# Patient Record
Sex: Male | Born: 1935 | Race: White | Hispanic: No | Marital: Married | Smoking: Never smoker
Health system: Southern US, Community
[De-identification: ages and names within clinical notes are randomized; demographics above are authoritative.]

## PROBLEM LIST (undated history)

## (undated) DIAGNOSIS — L03211 Cellulitis of face: Secondary | ICD-10-CM

## (undated) DIAGNOSIS — I428 Other cardiomyopathies: Secondary | ICD-10-CM

## (undated) DIAGNOSIS — Z950 Presence of cardiac pacemaker: Secondary | ICD-10-CM

## (undated) DIAGNOSIS — R569 Unspecified convulsions: Secondary | ICD-10-CM

## (undated) DIAGNOSIS — I10 Essential (primary) hypertension: Secondary | ICD-10-CM

## (undated) DIAGNOSIS — Z8719 Personal history of other diseases of the digestive system: Secondary | ICD-10-CM

## (undated) DIAGNOSIS — F32A Depression, unspecified: Secondary | ICD-10-CM

## (undated) DIAGNOSIS — F0390 Unspecified dementia without behavioral disturbance: Secondary | ICD-10-CM

## (undated) DIAGNOSIS — I442 Atrioventricular block, complete: Secondary | ICD-10-CM

## (undated) DIAGNOSIS — I639 Cerebral infarction, unspecified: Secondary | ICD-10-CM

## (undated) DIAGNOSIS — I951 Orthostatic hypotension: Secondary | ICD-10-CM

## (undated) DIAGNOSIS — G459 Transient cerebral ischemic attack, unspecified: Secondary | ICD-10-CM

## (undated) DIAGNOSIS — G4730 Sleep apnea, unspecified: Secondary | ICD-10-CM

## (undated) DIAGNOSIS — G8929 Other chronic pain: Secondary | ICD-10-CM

## (undated) DIAGNOSIS — E785 Hyperlipidemia, unspecified: Secondary | ICD-10-CM

## (undated) DIAGNOSIS — M069 Rheumatoid arthritis, unspecified: Secondary | ICD-10-CM

## (undated) DIAGNOSIS — I509 Heart failure, unspecified: Secondary | ICD-10-CM

## (undated) DIAGNOSIS — I2510 Atherosclerotic heart disease of native coronary artery without angina pectoris: Secondary | ICD-10-CM

## (undated) DIAGNOSIS — F329 Major depressive disorder, single episode, unspecified: Secondary | ICD-10-CM

## (undated) HISTORY — PX: APPENDECTOMY: SHX54

## (undated) HISTORY — DX: Cerebral infarction, unspecified: I63.9

## (undated) HISTORY — PX: EXPLORATORY LAPAROTOMY: SUR591

## (undated) HISTORY — DX: Transient cerebral ischemic attack, unspecified: G45.9

## (undated) HISTORY — PX: TONSILLECTOMY: SUR1361

## (undated) HISTORY — PX: HERNIA REPAIR: SHX51

## (undated) HISTORY — DX: Depression, unspecified: F32.A

## (undated) HISTORY — DX: Essential (primary) hypertension: I10

## (undated) HISTORY — DX: Unspecified dementia, unspecified severity, without behavioral disturbance, psychotic disturbance, mood disturbance, and anxiety: F03.90

## (undated) HISTORY — DX: Other chronic pain: G89.29

## (undated) HISTORY — PX: CATARACT EXTRACTION W/ INTRAOCULAR LENS  IMPLANT, BILATERAL: SHX1307

## (undated) HISTORY — DX: Unspecified convulsions: R56.9

## (undated) HISTORY — DX: Major depressive disorder, single episode, unspecified: F32.9

## (undated) HISTORY — PX: CHOLECYSTECTOMY OPEN: SUR202

## (undated) HISTORY — PX: KNEE CARTILAGE SURGERY: SHX688

---

## 1997-10-29 ENCOUNTER — Ambulatory Visit (HOSPITAL_BASED_OUTPATIENT_CLINIC_OR_DEPARTMENT_OTHER): Admission: RE | Admit: 1997-10-29 | Discharge: 1997-10-29 | Payer: Self-pay | Admitting: Orthopaedic Surgery

## 2000-08-08 ENCOUNTER — Inpatient Hospital Stay (HOSPITAL_COMMUNITY): Admission: EM | Admit: 2000-08-08 | Discharge: 2000-08-09 | Payer: Self-pay

## 2000-08-09 ENCOUNTER — Encounter: Payer: Self-pay | Admitting: Cardiovascular Disease

## 2003-02-19 ENCOUNTER — Encounter: Admission: RE | Admit: 2003-02-19 | Discharge: 2003-02-19 | Payer: Self-pay | Admitting: Gastroenterology

## 2004-12-22 ENCOUNTER — Encounter: Admission: RE | Admit: 2004-12-22 | Discharge: 2004-12-22 | Payer: Self-pay | Admitting: Family Medicine

## 2005-07-28 ENCOUNTER — Encounter: Admission: RE | Admit: 2005-07-28 | Discharge: 2005-07-28 | Payer: Self-pay | Admitting: Gastroenterology

## 2006-02-08 DIAGNOSIS — R569 Unspecified convulsions: Secondary | ICD-10-CM

## 2006-02-08 DIAGNOSIS — G459 Transient cerebral ischemic attack, unspecified: Secondary | ICD-10-CM

## 2006-02-08 DIAGNOSIS — I639 Cerebral infarction, unspecified: Secondary | ICD-10-CM

## 2006-02-08 DIAGNOSIS — G4730 Sleep apnea, unspecified: Secondary | ICD-10-CM

## 2006-02-08 HISTORY — DX: Sleep apnea, unspecified: G47.30

## 2006-02-08 HISTORY — DX: Unspecified convulsions: R56.9

## 2006-02-08 HISTORY — DX: Cerebral infarction, unspecified: I63.9

## 2006-02-08 HISTORY — DX: Transient cerebral ischemic attack, unspecified: G45.9

## 2006-11-23 ENCOUNTER — Encounter: Admission: RE | Admit: 2006-11-23 | Discharge: 2006-11-23 | Payer: Self-pay | Admitting: Family Medicine

## 2006-12-01 ENCOUNTER — Encounter: Admission: RE | Admit: 2006-12-01 | Discharge: 2006-12-01 | Payer: Self-pay | Admitting: Neurology

## 2008-06-13 ENCOUNTER — Encounter: Admission: RE | Admit: 2008-06-13 | Discharge: 2008-06-13 | Payer: Self-pay | Admitting: Geriatric Medicine

## 2008-12-26 ENCOUNTER — Encounter: Admission: RE | Admit: 2008-12-26 | Discharge: 2008-12-26 | Payer: Self-pay | Admitting: Gastroenterology

## 2009-02-19 DIAGNOSIS — L03211 Cellulitis of face: Secondary | ICD-10-CM

## 2009-02-19 HISTORY — DX: Cellulitis of face: L03.211

## 2009-03-31 ENCOUNTER — Encounter: Admission: RE | Admit: 2009-03-31 | Discharge: 2009-03-31 | Payer: Self-pay | Admitting: Cardiovascular Disease

## 2009-04-01 ENCOUNTER — Ambulatory Visit (HOSPITAL_COMMUNITY)
Admission: RE | Admit: 2009-04-01 | Discharge: 2009-04-01 | Payer: Self-pay | Source: Home / Self Care | Admitting: Cardiovascular Disease

## 2009-04-01 HISTORY — PX: CARDIAC CATHETERIZATION: SHX172

## 2010-05-21 ENCOUNTER — Other Ambulatory Visit: Payer: Self-pay | Admitting: Cardiovascular Disease

## 2010-05-21 ENCOUNTER — Ambulatory Visit
Admission: RE | Admit: 2010-05-21 | Discharge: 2010-05-21 | Disposition: A | Discharge status: Home / Self Care | Payer: Medicare Other | Source: Ambulatory Visit | Attending: Cardiovascular Disease | Admitting: Cardiovascular Disease

## 2010-06-26 NOTE — Discharge Summary (Signed)
[place]  Patient:    Thomas Long, Thomas Long                        MRN: [id number] Adm. Date:  [id number] Disc. Date: [id number] Attending:  Colon Branch Dictator:   Tereso Newcomer, P.A.-C CC:         Vale Haven. Elson Clan.D.Noralyn Pick. Eden Emms, M.D. [facility], [facility]   Discharge Summary  DATE OF BIRTH:  Jun 20, 1949  DISCHARGE DIAGNOSES: 1. Chest pain, etiology unclear.  Myocardial infarction ruled out. 2. Positive family history for coronary artery disease.  PROCEDURES:  Adenosine Cardiolite on August 09, 2000, with sonographic images revealing EF 46%, no ischemia.  HOSPITAL COURSE:  This 77 year old male with history of normal cardiac catheterization in the early 1990s presented to the emergency room with complaints of chest pain.  His pain started at around 2:30 after lying down. He went to his primary physician who felt the patient had to be further evaluated in the emergency room.  The pain is described as constant with intermittent increases.  No aggravating alleviating factors.  His cardiac risk factors are negative for CAD, negative for hypertension, negative for diabetes mellitus, negative for tobacco.  His lipid status is unknown, and he does have a family history of CAD.  MEDICATIONS PRIOR TO ADMISSION:  None.  PHYSICAL EXAMINATION:  VITAL SIGNS:  Blood pressure 135/73, pulse 85, respirations 16.  NECK:  No JVD.  HEART:  Regular rate and rhythm.  Normal S1, S2.  CHEST:  Clear to auscultation bilaterally.  ABDOMEN:  Nontender.  EXTREMITIES:  Without clubbing, cyanosis, or edema.  LABORATORY DATA:  EKG: Sinus rhythm, rate 92.  No ischemic changes.  Chest x-ray: No acute disease.  HOSPITAL COURSE:  The patient was admitted for chest pain.  His cardiac were enzymes were negative x 2, and he ruled out for MI.  The patients pain was felt to be fairly atypical.  He was further evaluated with adenosine Cardiolite.  This was  performed on August 09, 2000, and the results are noted above.  The patient was still complaining of some pain on the morning of August 09, 2000.  He was started empirically on Protonix 40 mg a day.  Due to nonischemic Cardiolite, it was felt the patient was stable enough for discharge to home.  He would have further followup with his primary care physician.  LABORATORY DATA:  White blood cell count 6000, hemoglobin 12.5, hematocrit 36.4, platelet count 332,000.  INR 1.1.  Sodium 139, potassium 4, chloride 106, CO2 29, glucose 112, BUN 15, creatinine 0.7, total bilirubin 0.6, alkaline phosphatase 52, SGOT 23, SGPT 18, total protein 6.3, albumin 3.3, calcium 8.4.  CK #1, 125; #2, 95.  CK-MB #1, 5.8; #2, 4.1.  Troponin I #1 0.03; #2, 0.02.  Lipid profile: Total cholesterol 151, triglycerides 114, HDL 38, LDL 90.  DISCHARGE MEDICATIONS: 1. Coated aspirin 325 mg a day. 2. Protonix 40 mg a day.  ACTIVITY:  As tolerated.  DIET:  Low-fat, low-sodium diet.  FOLLOWUP:  The patient is to follow up with Dr. Andrey Campanile in one week, and he should be call for an appointment.  Decision on whether or not to proceed with Protonix therapy will be deferred to Dr. Andrey Campanile as well as any further workup. The patient should see Dr. Eden Emms in the future as needed. DD:  08/09/00 TD:  08/09/00 Job: [id number]

## 2011-02-16 DIAGNOSIS — L570 Actinic keratosis: Secondary | ICD-10-CM | POA: Diagnosis not present

## 2011-02-17 DIAGNOSIS — R195 Other fecal abnormalities: Secondary | ICD-10-CM | POA: Diagnosis not present

## 2011-02-25 DIAGNOSIS — R198 Other specified symptoms and signs involving the digestive system and abdomen: Secondary | ICD-10-CM | POA: Diagnosis not present

## 2011-02-25 DIAGNOSIS — R195 Other fecal abnormalities: Secondary | ICD-10-CM | POA: Diagnosis not present

## 2011-03-29 DIAGNOSIS — H4011X0 Primary open-angle glaucoma, stage unspecified: Secondary | ICD-10-CM | POA: Diagnosis not present

## 2011-03-29 DIAGNOSIS — H409 Unspecified glaucoma: Secondary | ICD-10-CM | POA: Diagnosis not present

## 2011-05-26 DIAGNOSIS — Z09 Encounter for follow-up examination after completed treatment for conditions other than malignant neoplasm: Secondary | ICD-10-CM | POA: Diagnosis not present

## 2011-05-26 DIAGNOSIS — K5730 Diverticulosis of large intestine without perforation or abscess without bleeding: Secondary | ICD-10-CM | POA: Diagnosis not present

## 2011-05-26 DIAGNOSIS — Z86010 Personal history of colonic polyps: Secondary | ICD-10-CM | POA: Diagnosis not present

## 2011-05-26 DIAGNOSIS — D126 Benign neoplasm of colon, unspecified: Secondary | ICD-10-CM | POA: Diagnosis not present

## 2011-06-28 DIAGNOSIS — H10509 Unspecified blepharoconjunctivitis, unspecified eye: Secondary | ICD-10-CM | POA: Diagnosis not present

## 2011-06-28 DIAGNOSIS — H409 Unspecified glaucoma: Secondary | ICD-10-CM | POA: Diagnosis not present

## 2011-06-28 DIAGNOSIS — H4011X0 Primary open-angle glaucoma, stage unspecified: Secondary | ICD-10-CM | POA: Diagnosis not present

## 2011-07-01 DIAGNOSIS — I119 Hypertensive heart disease without heart failure: Secondary | ICD-10-CM | POA: Diagnosis not present

## 2011-07-01 DIAGNOSIS — I2510 Atherosclerotic heart disease of native coronary artery without angina pectoris: Secondary | ICD-10-CM | POA: Diagnosis not present

## 2011-07-07 DIAGNOSIS — Z1331 Encounter for screening for depression: Secondary | ICD-10-CM | POA: Diagnosis not present

## 2011-07-07 DIAGNOSIS — E782 Mixed hyperlipidemia: Secondary | ICD-10-CM | POA: Diagnosis not present

## 2011-07-07 DIAGNOSIS — I10 Essential (primary) hypertension: Secondary | ICD-10-CM | POA: Diagnosis not present

## 2011-07-07 DIAGNOSIS — Z0000 Encounter for general adult medical examination without abnormal findings: Secondary | ICD-10-CM | POA: Diagnosis not present

## 2011-07-07 DIAGNOSIS — Z79899 Other long term (current) drug therapy: Secondary | ICD-10-CM | POA: Diagnosis not present

## 2011-07-21 DIAGNOSIS — I63239 Cerebral infarction due to unspecified occlusion or stenosis of unspecified carotid arteries: Secondary | ICD-10-CM | POA: Diagnosis not present

## 2011-07-21 DIAGNOSIS — R569 Unspecified convulsions: Secondary | ICD-10-CM | POA: Diagnosis not present

## 2011-08-02 DIAGNOSIS — I10 Essential (primary) hypertension: Secondary | ICD-10-CM | POA: Diagnosis not present

## 2011-08-02 DIAGNOSIS — E782 Mixed hyperlipidemia: Secondary | ICD-10-CM | POA: Diagnosis not present

## 2011-08-02 DIAGNOSIS — Z1331 Encounter for screening for depression: Secondary | ICD-10-CM | POA: Diagnosis not present

## 2011-08-02 DIAGNOSIS — Z79899 Other long term (current) drug therapy: Secondary | ICD-10-CM | POA: Diagnosis not present

## 2011-08-02 DIAGNOSIS — Z0000 Encounter for general adult medical examination without abnormal findings: Secondary | ICD-10-CM | POA: Diagnosis not present

## 2011-08-17 DIAGNOSIS — L219 Seborrheic dermatitis, unspecified: Secondary | ICD-10-CM | POA: Diagnosis not present

## 2011-08-17 DIAGNOSIS — L570 Actinic keratosis: Secondary | ICD-10-CM | POA: Diagnosis not present

## 2011-10-04 DIAGNOSIS — H409 Unspecified glaucoma: Secondary | ICD-10-CM | POA: Diagnosis not present

## 2011-10-04 DIAGNOSIS — H4011X0 Primary open-angle glaucoma, stage unspecified: Secondary | ICD-10-CM | POA: Diagnosis not present

## 2011-10-08 DIAGNOSIS — J309 Allergic rhinitis, unspecified: Secondary | ICD-10-CM | POA: Diagnosis not present

## 2011-11-04 DIAGNOSIS — Z23 Encounter for immunization: Secondary | ICD-10-CM | POA: Diagnosis not present

## 2011-12-22 DIAGNOSIS — I10 Essential (primary) hypertension: Secondary | ICD-10-CM | POA: Diagnosis not present

## 2011-12-22 DIAGNOSIS — M6530 Trigger finger, unspecified finger: Secondary | ICD-10-CM | POA: Diagnosis not present

## 2011-12-30 DIAGNOSIS — H4011X0 Primary open-angle glaucoma, stage unspecified: Secondary | ICD-10-CM | POA: Diagnosis not present

## 2011-12-30 DIAGNOSIS — Z961 Presence of intraocular lens: Secondary | ICD-10-CM | POA: Diagnosis not present

## 2011-12-30 DIAGNOSIS — H409 Unspecified glaucoma: Secondary | ICD-10-CM | POA: Diagnosis not present

## 2012-03-13 DIAGNOSIS — H409 Unspecified glaucoma: Secondary | ICD-10-CM | POA: Diagnosis not present

## 2012-03-13 DIAGNOSIS — H4011X0 Primary open-angle glaucoma, stage unspecified: Secondary | ICD-10-CM | POA: Diagnosis not present

## 2012-05-30 ENCOUNTER — Other Ambulatory Visit (HOSPITAL_COMMUNITY): Payer: Self-pay | Admitting: Cardiovascular Disease

## 2012-05-30 DIAGNOSIS — R5381 Other malaise: Secondary | ICD-10-CM | POA: Diagnosis not present

## 2012-05-30 DIAGNOSIS — R0989 Other specified symptoms and signs involving the circulatory and respiratory systems: Secondary | ICD-10-CM | POA: Diagnosis not present

## 2012-05-30 DIAGNOSIS — R5383 Other fatigue: Secondary | ICD-10-CM | POA: Diagnosis not present

## 2012-05-30 DIAGNOSIS — E782 Mixed hyperlipidemia: Secondary | ICD-10-CM | POA: Diagnosis not present

## 2012-05-30 DIAGNOSIS — R0609 Other forms of dyspnea: Secondary | ICD-10-CM | POA: Diagnosis not present

## 2012-05-30 DIAGNOSIS — R0600 Dyspnea, unspecified: Secondary | ICD-10-CM

## 2012-05-31 DIAGNOSIS — I119 Hypertensive heart disease without heart failure: Secondary | ICD-10-CM | POA: Diagnosis not present

## 2012-05-31 DIAGNOSIS — E782 Mixed hyperlipidemia: Secondary | ICD-10-CM | POA: Diagnosis not present

## 2012-05-31 DIAGNOSIS — Z79899 Other long term (current) drug therapy: Secondary | ICD-10-CM | POA: Diagnosis not present

## 2012-05-31 DIAGNOSIS — R5381 Other malaise: Secondary | ICD-10-CM | POA: Diagnosis not present

## 2012-06-12 DIAGNOSIS — H409 Unspecified glaucoma: Secondary | ICD-10-CM | POA: Diagnosis not present

## 2012-06-12 DIAGNOSIS — H4011X0 Primary open-angle glaucoma, stage unspecified: Secondary | ICD-10-CM | POA: Diagnosis not present

## 2012-06-21 ENCOUNTER — Ambulatory Visit (HOSPITAL_COMMUNITY)
Admission: RE | Admit: 2012-06-21 | Discharge: 2012-06-21 | Disposition: A | Discharge status: Home / Self Care | Payer: Medicare Other | Source: Ambulatory Visit | Attending: Cardiovascular Disease | Admitting: Cardiovascular Disease

## 2012-06-21 DIAGNOSIS — R0609 Other forms of dyspnea: Secondary | ICD-10-CM | POA: Diagnosis not present

## 2012-06-21 DIAGNOSIS — I079 Rheumatic tricuspid valve disease, unspecified: Secondary | ICD-10-CM | POA: Insufficient documentation

## 2012-06-21 DIAGNOSIS — I059 Rheumatic mitral valve disease, unspecified: Secondary | ICD-10-CM | POA: Diagnosis not present

## 2012-06-21 DIAGNOSIS — R0989 Other specified symptoms and signs involving the circulatory and respiratory systems: Secondary | ICD-10-CM | POA: Insufficient documentation

## 2012-06-21 DIAGNOSIS — R0600 Dyspnea, unspecified: Secondary | ICD-10-CM

## 2012-06-21 NOTE — Progress Notes (Signed)
Thomas Long [facility]   2D echo completed 06/21/2012.   Cindy Adan Beal, RDCS  

## 2012-07-07 NOTE — Progress Notes (Signed)
Chart pulled and taken to Dr. Tresa Endo for review per his request.

## 2012-07-13 DIAGNOSIS — R350 Frequency of micturition: Secondary | ICD-10-CM | POA: Diagnosis not present

## 2012-07-13 DIAGNOSIS — E782 Mixed hyperlipidemia: Secondary | ICD-10-CM | POA: Diagnosis not present

## 2012-07-13 DIAGNOSIS — Z1331 Encounter for screening for depression: Secondary | ICD-10-CM | POA: Diagnosis not present

## 2012-07-13 DIAGNOSIS — Z79899 Other long term (current) drug therapy: Secondary | ICD-10-CM | POA: Diagnosis not present

## 2012-07-13 DIAGNOSIS — Z0000 Encounter for general adult medical examination without abnormal findings: Secondary | ICD-10-CM | POA: Diagnosis not present

## 2012-07-13 DIAGNOSIS — I10 Essential (primary) hypertension: Secondary | ICD-10-CM | POA: Diagnosis not present

## 2012-07-26 ENCOUNTER — Ambulatory Visit (INDEPENDENT_AMBULATORY_CARE_PROVIDER_SITE_OTHER): Payer: Medicare Other | Admitting: Neurology

## 2012-07-26 ENCOUNTER — Encounter: Payer: Self-pay | Admitting: Neurology

## 2012-07-26 VITALS — BP 107/63 | HR 92 | Resp 16 | Wt 200.0 lb

## 2012-07-26 DIAGNOSIS — R569 Unspecified convulsions: Secondary | ICD-10-CM | POA: Diagnosis not present

## 2012-07-26 DIAGNOSIS — I6350 Cerebral infarction due to unspecified occlusion or stenosis of unspecified cerebral artery: Secondary | ICD-10-CM | POA: Diagnosis not present

## 2012-07-26 DIAGNOSIS — I639 Cerebral infarction, unspecified: Secondary | ICD-10-CM

## 2012-07-26 DIAGNOSIS — G459 Transient cerebral ischemic attack, unspecified: Secondary | ICD-10-CM | POA: Insufficient documentation

## 2012-07-26 MED ORDER — LEVETIRACETAM ER 500 MG PO TB24: 500.0000 mg | ORAL_TABLET | Freq: Every day | ORAL | Status: DC

## 2012-07-26 MED ORDER — ASPIRIN-DIPYRIDAMOLE ER 25-200 MG PO CP12: 1.0000 | ORAL_CAPSULE | Freq: Two times a day (BID) | ORAL | Status: DC

## 2012-07-26 NOTE — Progress Notes (Signed)
[facility]  Provider: CM/  Dr. Pearlean Brownie- seen today by Dr .Vickey Huger  Referring Provider:  Pete Glatter MD  Primary Care Physician:  Delia Heady MD, Darrol Angel .    Chief Complaint : abnormal carotid doppler scan.   HPI:  Thomas Long is a 79 y.o. 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 Seen  here as a referral / follow up. Last seen for Dr. Pearlean Brownie by Darrol Angel in June 2013 . The  patient had a small stroke  In 2008 and than a new onset  frontal lobe seizure- so far a single event..  This patient has a history of psoriasis with severe arthritis and was treated with Enbrel until 3 years ago when he developed a facial cellulitis, he has remained untreated for the psoriatic condition since that time. He has a history of nocturnal seizure attributed to to a small frontal lobe infarct on the left side, this was discovered in 2008.  EEG at that time interpreted by Dr. Kelli Hope and  demonstrated a focal sharp activity in the left temple area. He started now on Extended release as the regular form of Keppra caused dizziness and drowsiness. The dorsal is also decreased in comparison to the initial goals. The patient had no recent falls, no weakness or clumsiness, the patient is driving without difficulty and has not gotten lost. He sometimes has delay it worked finding or naming. His wife added that he may have some amnestic spells but there is no short term or recent amnesia. Dr. Nicholaus Bloom is his cardiologist, Dr. Pete Glatter is his primary doctor. He was assigned by Sherril Croon 2012 and 2013 visit to Dr. Pearlean Brownie. He he underwent a stroke screening and heart disease screening through his church, 3 weeks ago with with a result of moderate carotid artery stenosis bilaterally. No evidence of PVD and" normal heart function". The report is not available here, as the patient  did not bring it.  Dr Pearlean Brownie is not in the  office today and the patient was scheduled with me.                    Review of Systems: Out of a complete 14 system review, the patient complains of only the following symptoms, and all other reviewed systems are negative.  painfull arthritis, psoriasis, single seizure.glaucoma.  "Carotid stenosis" , trouble to swallow solid  Food.  The patient had a tonsillectomy in adulthood.   History   Social History  . Marital Status: Married    Spouse Name: N/A    Number of Children: 5  . Years of Education: N/A   Occupational History  . Not on file.   Social History Main Topics  . Smoking status: Never Smoker   . Smokeless tobacco: Not on file  . Alcohol Use: No  . Drug Use: No  . Sexually Active: Not on file   Other Topics Concern  . Not on file   Social History Narrative  . No narrative on file    No family history on file.  Past Medical History  Diagnosis Date  . Thyroid disorder   . Depression   . Anxiety   . Diabetes   . High cholesterol   . Dementia   . Hypertension   . Chronic pain   . Heart disease   . Headache(784.0)     migraines  . Seizures   . Stroke   .  TIA (transient ischemic attack)     Past Surgical History  Procedure Laterality Date  . Facial cellulitis  02/19/09  . Cardiac catheterization  04/01/09    Current Outpatient Prescriptions  Medication Sig Dispense Refill  . loratadine (CLARITIN) 10 MG tablet Take 10 mg by mouth daily.      . Multiple Vitamins-Minerals (CENTRUM SILVER ADULT 50+ PO) Take 1 capsule by mouth daily.      Marland Kitchen atorvastatin (LIPITOR) 10 MG tablet Take 10 mg by mouth daily.      Marland Kitchen dipyridamole-aspirin (AGGRENOX) 200-25 MG per 12 hr capsule Take 1 capsule by mouth 2 (two) times daily.      Marland Kitchen levETIRAcetam (KEPPRA) 500 MG tablet Take 500 mg by mouth daily. [id number]      . nebivolol (BYSTOLIC) 5 MG tablet Take 2.5 mg by mouth 2 (two) times daily.        No current facility-administered medications for this visit.     Allergies as of 07/26/2012  . (Not on File)    Vitals: BP 107/63  Pulse 92  Resp 16  Wt 200 lb (90.719 kg)  BMI 31.32 kg/m2 Last Weight:  Wt Readings from Last 1 Encounters:  07/26/12 200 lb (90.719 kg)   Last Height:   Ht Readings from Last 1 Encounters:  07/26/12 5\' 7"  (1.702 m)     Physical exam:  General: The patient is awake, alert and appears not in acute distress. The patient is well groomed. Head: Normocephalic, atraumatic. Neck is supple. Mallampati 1  with asymmetry of the uvula , lower on the left.  neck circumference: 16.5 inches , no retrognathia.  Cardiovascular:  Regular rate and rhythm, without  murmurs or carotid bruit, and without distended neck veins. Respiratory: Lungs are clear to auscultation. Skin:  Without evidence of edema, or rash Trunk: BMI is elevated and patient  has normal posture.  Neurologic exam : The patient is awake and alert, oriented to place and time.  Memory subjective  described as mildly impaired for names and otherwise  intact. There is a normal attention span & concentration ability.  Speech is fluent with mild  Dysphonia, not aphasia. Mood and affect are appropriate.  Cranial nerves: Pupils are equal and briskly reactive to light.  He was unable to smoothly persue a moving object , appears to have central scotoma.   Extraocular movements  in vertical and horizontal planes without nystagmus. Hearing to finger rub intact.   Facial sensation intact to fine touch. Facial motor strength is symmetric and tongue  Midline, but uvula is lower on the left .  Motor exam:  Normal tone and normal muscle bulk and symmetric normal strength in all extremities. He has pain and related provision of lesser grip strength.   Sensory:  Fine touch, pinprick and vibration were tested in all extremities. Proprioception is tested ,was  normal.  Coordination: Rapid alternating movements in the fingers/hands is tested and normal. Finger-to-nose maneuver  tested and normal without evidence of ataxia, dysmetria or tremor.  Gait and station: Patient walks without assistive device, able to climb up to the exam table.Stance is stable and normal. Steps are un-fragmented, but wide based . He needed no assistence.  Romberg testing is positvel.  Deep tendon reflexes: in the  upper and lower extremities are symmetric and intact. Babinski maneuver response is downgoing   downgoing.   Assessment:  After physical and neurologic examination, review of laboratory studies, imaging, neurophysiology testing and pre-existing records,  I will ask  his primary neurologist , Dr Pearlean Brownie to  evaluate the outside carotid doppler reports. He has no known recent stroke, and needs at this time an MRI brain only for the memory loss evaluation.  MMSE was 29-30 , and is not indicative of dementia.  His uvula was likely damaged in the tonsillectomy.   Plan:  Treatment plan and additional workup will be reviewed under Problem List.   I refilled his Keppra and not place him on aricept , as not to lower his seizure threshold. Dr Tresa Endo will see him next week for BP control and keeps him on Aggrenox.

## 2012-07-26 NOTE — Patient Instructions (Signed)
  Dear Mr. Thomas Long, please provide a copy of your recent testing including the carotid artery report EKG and peripheral vascular checkup to our office that we are able to see if there is a concern of a higher stroke risk  To you.   I will ask Dr. Pearlean Brownie to follow up again, if any abnormalities a warrant any intervention.  His reminds her ophthalmologist to obtain a visual field test.

## 2012-08-01 ENCOUNTER — Ambulatory Visit: Payer: Medicare Other | Admitting: Cardiovascular Disease

## 2012-08-01 ENCOUNTER — Encounter: Payer: Self-pay | Admitting: Cardiovascular Disease

## 2012-08-01 ENCOUNTER — Ambulatory Visit (INDEPENDENT_AMBULATORY_CARE_PROVIDER_SITE_OTHER): Payer: Medicare Other | Admitting: Cardiovascular Disease

## 2012-08-01 VITALS — BP 110/60 | HR 76 | Ht 68.0 in | Wt 202.8 lb

## 2012-08-01 DIAGNOSIS — R42 Dizziness and giddiness: Secondary | ICD-10-CM

## 2012-08-01 DIAGNOSIS — I2510 Atherosclerotic heart disease of native coronary artery without angina pectoris: Secondary | ICD-10-CM | POA: Diagnosis not present

## 2012-08-01 NOTE — Patient Instructions (Addendum)
Your physician recommends that you schedule a follow-up appointment in: 3 months.  Your physician has recommended you make the following change in your medication: bystolic change to 1/2 of the 2.5 mg tablet for 4 days then take 1/2 tablet  Every other day for 4  days then stop completely.  Call back if compression hose needed.

## 2012-08-08 DIAGNOSIS — R3129 Other microscopic hematuria: Secondary | ICD-10-CM | POA: Diagnosis not present

## 2012-08-10 ENCOUNTER — Encounter: Payer: Self-pay | Admitting: Cardiovascular Disease

## 2012-08-10 DIAGNOSIS — I2510 Atherosclerotic heart disease of native coronary artery without angina pectoris: Secondary | ICD-10-CM | POA: Insufficient documentation

## 2012-08-10 DIAGNOSIS — R42 Dizziness and giddiness: Secondary | ICD-10-CM | POA: Insufficient documentation

## 2012-08-10 NOTE — Progress Notes (Signed)
Patient ID: Thomas Long, male   DOB: Sep 15, 1931, 79 y.o.   MRN: [id number]     HPI: Thomas Long, is a 79 y.o. male who presents to the office today for followup cardiology evaluation.    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  In 2011 cardiac catheterization revealed low normal ejection fraction at 50% with mild coronary artery disease coronary calcification with segmental 20% narrowing in the proximal LAD and mid LAD with mild muscle bridging, calcification at the ostium of the right coronary artery with 20-30% narrowing in the proximal to mid RCA and 30% distal narrowing. He has a remote history of a TIA with possible seizure for which in the past he's been on Aggrenox and Keppra and is currently followed by neurology. An echo Doppler study done in April 2012 showed an ejection fraction of 50% with inferior hypokinesis, mild mitral annular calcification and mild TR, mild aortic sclerosis without stenosis. I had seen him on 05/30/2012 with complaints of increased fatigability as well as shortness of breath with less activity. Underwent a two-year followup echo Doppler study on 07/06/2012 which showed mild LVH. Ejection fraction remains 50-55% and again there was mild hypokinesis of the basal mid inferior myocardium. Gated grade 1 diastolic dysfunction. He had minimal pulmonary hypertension with PA pressure 32 mm. He didn't mitral annular calcification with trivial Thomas. His aortic valve was mildly sclerotic without stenosis.  Thomas Long does admit to episodes of mild dizziness if he stands up abruptly he continues to show fatigue. He denies chest pressure. He presents for followup evaluation.  Past Medical History  Diagnosis Date  . Thyroid disorder   . Depression   . Anxiety   . Diabetes   . High cholesterol   . Dementia   . Hypertension   . Chronic pain   . Heart disease   . Headache(784.0)     migraines  . Seizures   . Stroke   . TIA (transient ischemic  attack)     Past Surgical History  Procedure Laterality Date  . Facial cellulitis  02/19/09  . Cardiac catheterization  04/01/09    Not on File  Current Outpatient Prescriptions  Medication Sig Dispense Refill  . atorvastatin (LIPITOR) 10 MG tablet Take 10 mg by mouth daily.      Marland Kitchen dipyridamole-aspirin (AGGRENOX) 200-25 MG per 12 hr capsule Take 1 capsule by mouth 2 (two) times daily.  180 capsule  3  . levETIRAcetam (KEPPRA XR) 500 MG 24 hr tablet Take 1 tablet (500 mg total) by mouth daily.  90 tablet  3  . loratadine (CLARITIN) 10 MG tablet Take 10 mg by mouth daily.      . Multiple Vitamins-Minerals (CENTRUM SILVER ADULT 50+ PO) Take 1 capsule by mouth daily.      . nebivolol (BYSTOLIC) 2.5 MG tablet Take 2.5 mg by mouth daily.       No current facility-administered medications for this visit.    Socially he is married has 5 children 6 grandchildren. He typically goes to bed approximately 8:30 at night and wakes up at approximately 4 AM in the morning. In the early morning he spends time in prayer and then typically attends 7 AM Mass.  ROS is negative for fevers, chills or night sweats.  He denies chest pain. He denies any recurrent seizure activity. Denies paresthesias. He denies any passes. He denies significant edema. He recently was evaluated by his neurologist. apparently he is also scheduled to see  a urologist in the coming month. Other system review is negative.  PE BP 110/60  Pulse 76  Ht 5\' 8"  (1.727 m)  Wt 202 lb 12.8 oz (91.989 kg)  BMI 30.84 kg/m2  Repeat blood pressure by me was 98/64 supine and this changed to 90/60 standing. General: Alert, oriented, no distress.  Skin: normal turgor, no rashes HEENT: Normocephalic, atraumatic. Pupils round and reactive; sclera anicteric;no lid lag.  Nose without nasal septal hypertrophy Mouth/Parynx benign; Mallinpatti scale 2 Neck: No JVD, no carotid briuts Lungs: clear to ausculatation and percussion; no wheezing or  rales Heart: RRR, s1 s2 normal  1/6 systolic murmur, unchanged. Abdomen: soft, nontender; no hepatosplenomehaly, BS+; abdominal aorta nontender and not dilated by palpation. mild central adiposity.  Pulses 2+ Extremities: no clubbing cyanosis or edema, Homan's sign negative  Neurologic: grossly nonfocal  ECG: Normal sinus rhythm at 76 beats per minute. Borderline first-degree block with a period of 0206 ms. Nonspecific intraventricular conduction delay.   LABS:  BMET No results found for this basename: na, k, cl, co2, glucose, bun, creatinine, calcium, gfrnonaa, gfraa     Hepatic Function Panel  No results found for this basename: prot, albumin, ast, alt, alkphos, bilitot, bilidir, ibili     CBC No results found for this basename: wbc, rbc, hgb, hct, plt, mcv, mch, mchc, rdw, neutrabs, lymphsabs, monoabs, eosabs, basosabs     BNP No results found for this basename: probnp    Lipid Panel  No results found for this basename: chol, trig, hdl, cholhdl, vldl, ldlcalc     RADIOLOGY: No results found.    ASSESSMENT AND PLAN: Thomas  Long continues no fatigability as well remaining somewhat hypotensive. Recently, his hydrochlorothiazide dose had been discontinued by Dr. Pete Glatter. His echo Doppler study continues to show an ejection fraction in the 50-55% range. He has been taking Bystolic at just 2.5 mg daily. He will then reduce this to a half a pill for 4 days then half a pill every other day for 4 days and then he will discontinue the Bystolic altogether. Hopefully this will improve his symptoms of fatigue as well as his mild orthostasis changes with reference to his blood pressure. I will see him in several months for followup evaluation      Lennette Bihari, MD, [facility]  08/10/2012 12:49 PM

## 2012-08-29 DIAGNOSIS — K5730 Diverticulosis of large intestine without perforation or abscess without bleeding: Secondary | ICD-10-CM | POA: Diagnosis not present

## 2012-08-29 DIAGNOSIS — R3129 Other microscopic hematuria: Secondary | ICD-10-CM | POA: Diagnosis not present

## 2012-08-29 DIAGNOSIS — N400 Enlarged prostate without lower urinary tract symptoms: Secondary | ICD-10-CM | POA: Diagnosis not present

## 2012-08-30 DIAGNOSIS — L570 Actinic keratosis: Secondary | ICD-10-CM | POA: Diagnosis not present

## 2012-08-30 DIAGNOSIS — L219 Seborrheic dermatitis, unspecified: Secondary | ICD-10-CM | POA: Diagnosis not present

## 2012-08-30 DIAGNOSIS — L259 Unspecified contact dermatitis, unspecified cause: Secondary | ICD-10-CM | POA: Diagnosis not present

## 2012-09-01 DIAGNOSIS — R3129 Other microscopic hematuria: Secondary | ICD-10-CM | POA: Diagnosis not present

## 2012-09-11 ENCOUNTER — Telehealth: Payer: Self-pay | Admitting: Cardiovascular Disease

## 2012-09-11 DIAGNOSIS — H00029 Hordeolum internum unspecified eye, unspecified eyelid: Secondary | ICD-10-CM | POA: Diagnosis not present

## 2012-09-11 MED ORDER — ATORVASTATIN CALCIUM 10 MG PO TABS: 10.0000 mg | ORAL_TABLET | Freq: Every day | ORAL | Status: DC

## 2012-09-11 NOTE — Telephone Encounter (Signed)
Returned call.  Pt informed Refill(s) sent to pharmacy and of refill process.  Pt verbalized understanding.

## 2012-09-11 NOTE — Telephone Encounter (Signed)
Pt needs refill for atorvastatin 10 mg.  He gets it through [facility] and states they will not take refill request from him/patients.  Needs 90 day supply.[name] phone # is [phone].

## 2012-09-18 DIAGNOSIS — H409 Unspecified glaucoma: Secondary | ICD-10-CM | POA: Diagnosis not present

## 2012-09-18 DIAGNOSIS — H00029 Hordeolum internum unspecified eye, unspecified eyelid: Secondary | ICD-10-CM | POA: Diagnosis not present

## 2012-09-18 DIAGNOSIS — H4011X0 Primary open-angle glaucoma, stage unspecified: Secondary | ICD-10-CM | POA: Diagnosis not present

## 2012-10-02 DIAGNOSIS — H00019 Hordeolum externum unspecified eye, unspecified eyelid: Secondary | ICD-10-CM | POA: Diagnosis not present

## 2012-10-02 DIAGNOSIS — H0019 Chalazion unspecified eye, unspecified eyelid: Secondary | ICD-10-CM | POA: Diagnosis not present

## 2012-10-11 DIAGNOSIS — H0019 Chalazion unspecified eye, unspecified eyelid: Secondary | ICD-10-CM | POA: Diagnosis not present

## 2012-10-23 DIAGNOSIS — H409 Unspecified glaucoma: Secondary | ICD-10-CM | POA: Diagnosis not present

## 2012-10-23 DIAGNOSIS — H4011X0 Primary open-angle glaucoma, stage unspecified: Secondary | ICD-10-CM | POA: Diagnosis not present

## 2012-10-29 ENCOUNTER — Encounter: Payer: Self-pay | Admitting: *Deleted

## 2012-11-02 ENCOUNTER — Encounter: Payer: Self-pay | Admitting: Cardiovascular Disease

## 2012-11-02 ENCOUNTER — Telehealth: Payer: Self-pay | Admitting: Cardiovascular Disease

## 2012-11-02 ENCOUNTER — Ambulatory Visit (INDEPENDENT_AMBULATORY_CARE_PROVIDER_SITE_OTHER): Payer: Medicare Other | Admitting: Cardiovascular Disease

## 2012-11-02 VITALS — BP 112/70 | HR 94 | Ht 68.0 in | Wt 200.0 lb

## 2012-11-02 DIAGNOSIS — I2510 Atherosclerotic heart disease of native coronary artery without angina pectoris: Secondary | ICD-10-CM | POA: Diagnosis not present

## 2012-11-02 DIAGNOSIS — R42 Dizziness and giddiness: Secondary | ICD-10-CM | POA: Diagnosis not present

## 2012-11-02 DIAGNOSIS — R569 Unspecified convulsions: Secondary | ICD-10-CM | POA: Diagnosis not present

## 2012-11-02 DIAGNOSIS — G459 Transient cerebral ischemic attack, unspecified: Secondary | ICD-10-CM | POA: Diagnosis not present

## 2012-11-02 NOTE — Patient Instructions (Addendum)
Your physician recommends that you schedule a follow-up appointment in: 6 MONTHS. No changes were made today. 

## 2012-11-02 NOTE — Telephone Encounter (Signed)
Dr. Tresa Endo is in clinic today and message has been sent to him to review.  Caller will have to wait for response from Dr. Pierre Bali, CMA.  This note on cart.

## 2012-11-02 NOTE — Telephone Encounter (Signed)
Message forwarded to Dr. Kelly/Wanda, CMA.  

## 2012-11-02 NOTE — Telephone Encounter (Signed)
Need in writing that ir is all right for him to stop his Aggrenox.Please fax to [phone]  Att::Erica.If possible please send today.

## 2012-11-02 NOTE — Telephone Encounter (Signed)
Just checking on the fax sent over to clear him for surgery. Need tjhis  asap.It was faxed on 10-26-12.

## 2012-11-03 ENCOUNTER — Encounter: Payer: Self-pay | Admitting: *Deleted

## 2012-11-03 NOTE — Telephone Encounter (Signed)
Per kristen VO. From Dr. Tresa Endo okay to D/C aggrenox for 5 days. Note faxed to Dr. Retta Mac @[phone].

## 2012-11-04 ENCOUNTER — Encounter: Payer: Self-pay | Admitting: Cardiovascular Disease

## 2012-11-04 NOTE — Progress Notes (Signed)
Patient ID: Thomas Long, male   DOB: 1931-10-26, 79 y.o.   MRN: [id number]     HPI: Thomas Long, is a 79 y.o. male who presents to the office today for followup cardiology evaluation.    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  In 2011 cardiac catheterization revealed low normal ejection fraction at 50% with mild coronary artery disease coronary calcification with segmental 20% narrowing in the proximal LAD and mid LAD with mild muscle bridging, calcification at the ostium of the right coronary artery with 20-30% narrowing in the proximal to mid RCA and 30% distal narrowing. He has a remote history of a TIA with possible seizure for which in the past he's been on Aggrenox and Keppra and is currently followed by neurology. An echo Doppler study done in April 2012 showed an ejection fraction of 50% with inferior hypokinesis, mild mitral annular calcification and mild TR, mild aortic sclerosis without stenosis. I had seen him on 05/30/2012 with complaints of increased fatigability as well as shortness of breath with less activity.  A two-year followup echo Doppler study on 07/06/2012 which showed mild LVH. Ejection fraction was 50-55% and again there was mild hypokinesis of the basal mid inferior myocardium and  grade 1 diastolic dysfunction. He had minimal pulmonary hypertension with PA pressure 32 mm. He didn't mitral annular calcification with trivial MR. His aortic valve was mildly sclerotic without stenosis.  When I last saw on 08/01/2012 he did have orthostatic symptoms and was hypotensive. At that time, I recommended that he weaned slowly and ultimately discontinue his Bystolic. He has felt improved off this therapy. He denies any awareness of tachycardia palpitations or pulse irregularity. Presents for evaluation  Past Medical History  Diagnosis Date  . Thyroid disorder   . Depression   . Anxiety   . Diabetes   . High cholesterol   . Dementia   .  Hypertension   . Chronic pain   . Heart disease   . Headache(784.0)     migraines  . Seizures   . Stroke   . TIA (transient ischemic attack)   . Hx of echocardiogram 05/2010    showed EF 50% with inferior hypokinesis. He did have mild mitral annular calcification, mild TR, mild aortic sclerosis without stenosis.  Marland Kitchen History of stress test 05/2010    A moderate perfusion defect is seen in the apical septal, Basal inferoseptal and mid inferoseptal region. post stress EF 53%. Wall motion abnormalities. Abnormal myocardial perfusion study.    Past Surgical History  Procedure Laterality Date  . Facial cellulitis  02/19/09  . Cardiac catheterization  04/01/09    which showed low normal EF at 50% with question of underlying borderline area of minimal inferoapical hypercontractility. He had mild coronary obstructive disease with coronary calcification and segmental 20% narrowing in the LAD proximally, 20% in the mid LAD with muscle bridging. There was calcification at the ostium of the RCA, and 20 to 30%narrowing of the proximal to mid RCA with 30% distal n  . Cholecystectomy      No Known Allergies  Current Outpatient Prescriptions  Medication Sig Dispense Refill  . atorvastatin (LIPITOR) 10 MG tablet Take 1 tablet (10 mg total) by mouth daily.  90 tablet  2  . dipyridamole-aspirin (AGGRENOX) 200-25 MG per 12 hr capsule Take 1 capsule by mouth 2 (two) times daily.  180 capsule  3  . levETIRAcetam (KEPPRA XR) 500 MG 24 hr tablet Take 1 tablet (  500 mg total) by mouth daily.  90 tablet  3  . loratadine (CLARITIN) 10 MG tablet Take 10 mg by mouth daily.      . Multiple Vitamins-Minerals (CENTRUM SILVER ADULT 50+ PO) Take 1 capsule by mouth daily.       No current facility-administered medications for this visit.    Socially he is married has 5 children 6 grandchildren. He typically goes to bed approximately 8:30 at night and wakes up at approximately 4 AM in the morning. In the early morning he  spends time in prayer and then typically attends 7 AM Mass.  ROS is negative for fevers, chills or night sweats. His fatigue is somewhat improved. He denies any dizziness. He denies chest pain. He denies any recurrent seizure activity. Denies paresthesias.  He denies significant edema. He tells me he need to have her teeth extracted. He denies any recurrent seizure activity. He now sees Dr. Norlene Duel for his neurology care. Other system review is negative.  PE BP 112/70  Pulse 94  Ht 5\' 8"  (1.727 m)  Wt 200 lb (90.719 kg)  BMI 30.42 kg/m2    Repeat blood pressure by me was 106/70 supine and 98/70 standing. There was no significant pulse rise. General: Alert, oriented, no distress.  Skin: normal turgor, no rashes HEENT: Normocephalic, atraumatic. Pupils round and reactive; sclera anicteric;no lid lag.  Nose without nasal septal hypertrophy Mouth/Parynx benign; Mallinpatti scale 2 Neck: No JVD, no carotid briuts Lungs: clear to ausculatation and percussion; no wheezing or rales Heart: RRR, s1 s2 normal  1/6 systolic murmur, unchanged. Abdomen: soft, nontender; no hepatosplenomehaly, BS+; abdominal aorta nontender and not dilated by palpation. mild central adiposity.  Pulses 2+ Extremities: no clubbing cyanosis or edema, Homan's sign negative  Neurologic: grossly nonfocal  ECG: Normal sinus rhythm at 94beats per minute with a rare PAC. Borderline first-degree block; PR 210 ms. nonspecific T changes.  LABS:  BMET No results found for this basename: na,  k,  cl,  co2,  glucose,  bun,  creatinine,  calcium,  gfrnonaa,  gfraa     Hepatic Function Panel  No results found for this basename: prot,  albumin,  ast,  alt,  alkphos,  bilitot,  bilidir,  ibili     CBC No results found for this basename: wbc,  rbc,  hgb,  hct,  plt,  mcv,  mch,  mchc,  rdw,  neutrabs,  lymphsabs,  monoabs,  eosabs,  basosabs     BNP No results found for this basename: probnp    Lipid Panel  No results  found for this basename: chol,  trig,  hdl,  cholhdl,  vldl,  ldlcalc     RADIOLOGY: No results found.    ASSESSMENT AND PLAN: The tentorium does note more energy and his previous complaints of epistatic dizziness had improved with ultimate discontinuance of his Bystolic. His resting pulse has increased from the 70s to now in the 90s off his beta blocker therapy he denies any chest tightness. He denies edema. He has not had any neurologic symptoms. To reduce potential for bleeding I recommended that that he may need to hold his Aggrenox for approximately 5 days prior to his planned 4 teeth dental extraction but that he should contact the neurology office to see if this is okay. I will see him in 6 months for followup cardiology evaluation     Lennette Bihari, MD, [facility]  11/04/2012 9:29 AM

## 2012-11-14 ENCOUNTER — Encounter: Payer: Self-pay | Admitting: Cardiovascular Disease

## 2012-12-05 DIAGNOSIS — Z23 Encounter for immunization: Secondary | ICD-10-CM | POA: Diagnosis not present

## 2012-12-25 DIAGNOSIS — H4011X0 Primary open-angle glaucoma, stage unspecified: Secondary | ICD-10-CM | POA: Diagnosis not present

## 2012-12-25 DIAGNOSIS — H409 Unspecified glaucoma: Secondary | ICD-10-CM | POA: Diagnosis not present

## 2012-12-28 DIAGNOSIS — J019 Acute sinusitis, unspecified: Secondary | ICD-10-CM | POA: Diagnosis not present

## 2013-01-16 DIAGNOSIS — E782 Mixed hyperlipidemia: Secondary | ICD-10-CM | POA: Diagnosis not present

## 2013-01-16 DIAGNOSIS — I10 Essential (primary) hypertension: Secondary | ICD-10-CM | POA: Diagnosis not present

## 2013-01-16 DIAGNOSIS — Z79899 Other long term (current) drug therapy: Secondary | ICD-10-CM | POA: Diagnosis not present

## 2013-04-25 ENCOUNTER — Encounter: Payer: Self-pay | Admitting: Cardiovascular Disease

## 2013-04-25 ENCOUNTER — Ambulatory Visit (INDEPENDENT_AMBULATORY_CARE_PROVIDER_SITE_OTHER): Payer: Medicare Other | Admitting: Cardiovascular Disease

## 2013-04-25 VITALS — BP 108/60 | HR 103 | Ht 69.0 in | Wt 204.7 lb

## 2013-04-25 DIAGNOSIS — E559 Vitamin D deficiency, unspecified: Secondary | ICD-10-CM

## 2013-04-25 DIAGNOSIS — R002 Palpitations: Secondary | ICD-10-CM

## 2013-04-25 DIAGNOSIS — R5381 Other malaise: Secondary | ICD-10-CM

## 2013-04-25 DIAGNOSIS — E785 Hyperlipidemia, unspecified: Secondary | ICD-10-CM

## 2013-04-25 DIAGNOSIS — R5383 Other fatigue: Secondary | ICD-10-CM

## 2013-04-25 DIAGNOSIS — R42 Dizziness and giddiness: Secondary | ICD-10-CM

## 2013-04-25 DIAGNOSIS — R569 Unspecified convulsions: Secondary | ICD-10-CM

## 2013-04-25 DIAGNOSIS — I2510 Atherosclerotic heart disease of native coronary artery without angina pectoris: Secondary | ICD-10-CM

## 2013-04-25 DIAGNOSIS — G459 Transient cerebral ischemic attack, unspecified: Secondary | ICD-10-CM

## 2013-04-25 NOTE — Patient Instructions (Signed)
Your physician recommends that you return for lab work fasting.  Your physician recommends that you schedule a follow-up appointment in: 6 months. 

## 2013-04-26 DIAGNOSIS — E559 Vitamin D deficiency, unspecified: Secondary | ICD-10-CM | POA: Diagnosis not present

## 2013-04-26 DIAGNOSIS — R5381 Other malaise: Secondary | ICD-10-CM | POA: Diagnosis not present

## 2013-04-26 DIAGNOSIS — I2510 Atherosclerotic heart disease of native coronary artery without angina pectoris: Secondary | ICD-10-CM | POA: Diagnosis not present

## 2013-04-26 DIAGNOSIS — R5383 Other fatigue: Secondary | ICD-10-CM | POA: Diagnosis not present

## 2013-04-26 DIAGNOSIS — R002 Palpitations: Secondary | ICD-10-CM | POA: Diagnosis not present

## 2013-04-26 DIAGNOSIS — E785 Hyperlipidemia, unspecified: Secondary | ICD-10-CM | POA: Diagnosis not present

## 2013-04-26 LAB — LIPID PANEL
Cholesterol: 128 mg/dL (ref 0–200)
HDL: 45 mg/dL (ref >39)
LDL Cholesterol: 61 mg/dL (ref 0–99)
Total CHOL/HDL Ratio: 2.8 Ratio
Triglycerides: 110 mg/dL (ref <150)
VLDL: 22 mg/dL (ref 0–40)

## 2013-04-26 LAB — COMPREHENSIVE METABOLIC PANEL
ALT: 13 U/L (ref 0–53)
AST: 20 U/L (ref 0–37)
Albumin: 4.1 g/dL (ref 3.5–5.2)
Alkaline Phosphatase: 51 U/L (ref 39–117)
BUN: 18 mg/dL (ref 6–23)
CO2: 31 mEq/L (ref 19–32)
Calcium: 9.2 mg/dL (ref 8.4–10.5)
Chloride: 102 mEq/L (ref 96–112)
Creat: 0.84 mg/dL (ref 0.50–1.35)
Glucose, Bld: 104 mg/dL — ABNORMAL HIGH (ref 70–99)
Potassium: 4.5 mEq/L (ref 3.5–5.3)
Sodium: 138 mEq/L (ref 135–145)
Total Bilirubin: 0.9 mg/dL (ref 0.2–1.2)
Total Protein: 6.8 g/dL (ref 6.0–8.3)

## 2013-04-26 LAB — MAGNESIUM: Magnesium: 2.1 mg/dL (ref 1.5–2.5)

## 2013-04-26 LAB — CBC
HCT: 38.8 % — ABNORMAL LOW (ref 39.0–52.0)
Hemoglobin: 13.6 g/dL (ref 13.0–17.0)
MCH: 32.4 pg (ref 26.0–34.0)
MCHC: 35.1 g/dL (ref 30.0–36.0)
MCV: 92.4 fL (ref 78.0–100.0)
Platelets: 354 10*3/uL (ref 150–400)
RBC: 4.2 MIL/uL — ABNORMAL LOW (ref 4.22–5.81)
RDW: 13.4 % (ref 11.5–15.5)
WBC: 7.2 10*3/uL (ref 4.0–10.5)

## 2013-04-27 LAB — TSH: TSH: 0.994 u[IU]/mL (ref 0.350–4.500)

## 2013-04-29 ENCOUNTER — Encounter: Payer: Self-pay | Admitting: Cardiovascular Disease

## 2013-04-29 DIAGNOSIS — E785 Hyperlipidemia, unspecified: Secondary | ICD-10-CM | POA: Insufficient documentation

## 2013-04-29 NOTE — Progress Notes (Signed)
Patient ID: Thomas Long, male   DOB: May 17, 1931, 80 y.o.   MRN: [id number]      HPI: Thomas Long is a 80 y.o. male who presents to the office today for followup cardiology evaluation.    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  In 2011 cardiac catheterization revealed ejection fraction at 50% with mild coronary artery disease coronary calcification with segmental 20% narrowing in the proximal LAD and mid LAD with mild muscle bridging, calcification at the ostium of the right coronary artery with 20-30% narrowing in the proximal to mid RCA and 30% distal narrowing. He has a remote history of a TIA with possible seizure for which  He hs been on Aggrenox and Keppra and is currently followed by neurology. An echo Doppler study done in April 2012 showed an ejection fraction of 50% with inferior hypokinesis, mild mitral annular calcification and mild TR, mild aortic sclerosis without stenosis. I had seen him on 05/30/2012 with complaints of increased fatigability as well as shortness of breath with less activity.  A two-year followup echo Doppler study on 07/06/2012 which showed mild LVH. Ejection fraction was 50-55% and again there was mild hypokinesis of the basal mid inferior myocardium and  grade 1 diastolic dysfunction. He had minimal pulmonary hypertension with PA pressure 32 mm. He didn't mitral annular calcification with trivial MR. His aortic valve was mildly sclerotic without stenosis.  When I saw on 08/01/2012 he did have orthostatic symptoms and was hypotensive. At that time, I recommended that he weaned slowly and ultimately discontinue his Bystolic. He has felt improved off this therapy. He denies any awareness of tachycardia palpitations or pulse irregularity.  Since I last saw him in September 2014, he has remained fairly stable. He does note occasional sharp chest pain but when he rubs his chest this seems to improve the symptoms. He denies  exertional precipitation. At times he does note some occasional arm numbness. He does note fatigue. He is unaware of the patient's. He has had increased resting pulse particularly since he has been off his beta blocker therapy. He does not exercise.  Past Medical History  Diagnosis Date  . Thyroid disorder   . Depression   . Anxiety   . Diabetes   . High cholesterol   . Dementia   . Hypertension   . Chronic pain   . Heart disease   . Headache(784.0)     migraines  . Seizures   . Stroke   . TIA (transient ischemic attack)   . Hx of echocardiogram 05/2010    showed EF 50% with inferior hypokinesis. He did have mild mitral annular calcification, mild TR, mild aortic sclerosis without stenosis.  Marland Kitchen History of stress test 05/2010    A moderate perfusion defect is seen in the apical septal, Basal inferoseptal and mid inferoseptal region. post stress EF 53%. Wall motion abnormalities. Abnormal myocardial perfusion study.    Past Surgical History  Procedure Laterality Date  . Facial cellulitis  02/19/09  . Cardiac catheterization  04/01/09    which showed low normal EF at 50% with question of underlying borderline area of minimal inferoapical hypercontractility. He had mild coronary obstructive disease with coronary calcification and segmental 20% narrowing in the LAD proximally, 20% in the mid LAD with muscle bridging. There was calcification at the ostium of the RCA, and 20 to 30%narrowing of the proximal to mid RCA with 30% distal n  . Cholecystectomy  No Known Allergies  Current Outpatient Prescriptions  Medication Sig Dispense Refill  . atorvastatin (LIPITOR) 10 MG tablet Take 1 tablet (10 mg total) by mouth daily.  90 tablet  2  . dipyridamole-aspirin (AGGRENOX) 200-25 MG per 12 hr capsule Take 1 capsule by mouth 2 (two) times daily.  180 capsule  3  . levETIRAcetam (KEPPRA XR) 500 MG 24 hr tablet Take 1 tablet (500 mg total) by mouth daily.  90 tablet  3  . loratadine  (CLARITIN) 10 MG tablet Take 10 mg by mouth daily.      . Multiple Vitamins-Minerals (CENTRUM SILVER ADULT 50+ PO) Take 1 capsule by mouth daily.       No current facility-administered medications for this visit.    Socially he is married has 5 children 6 grandchildren. He typically goes to bed approximately 8:30 at night and wakes up at approximately 4 AM in the morning. In the early morning he spends time in prayer and then typically attends 7 AM Mass. Since I last saw him he is retired from his [employer].  ROS is negative for fevers, chills or night sweats. He denies visual changes or hearing difficulty. His fatigue is somewhat improved. He denies any dizziness. He denies chest pressure but does note an occasional sharp twinge. He denies abdominal pain. He denies nausea vomiting or diarrhea. He denies blood in stool or urine. He denies any recurrent seizure activity. Denies paresthesias.  He denies significant edema. He tells me he need to have her teeth extracted. He denies any recurrent seizure activity. He now sees Dr. Lilyan Punt for his neurology care. Other contents of 14 point system review is negative.  PE BP 108/60  Pulse 103  Ht 5\' 9"  (1.753 m)  Wt 204 lb 11.2 oz (92.851 kg)  BMI 30.22 kg/m2    Repeat blood pressure by me was 160/60 supine and was 120/70 standing. Pulse was tachycardic. General: Alert, oriented, no distress.  Skin: normal turgor, no rashes HEENT: Normocephalic, atraumatic. Pupils round and reactive; sclera anicteric;no lid lag.  Nose without nasal septal hypertrophy no xanthelasmas Mouth/Parynx benign; Mallinpatti scale 2 Neck: No JVD, no carotid bruits with normal carotid upstroke. Chest wall: Nontender to palpation Lungs: clear to ausculatation and percussion; no wheezing or rales Heart: RRR, s1 s2 normal  1/6 systolic murmur, unchanged. No diastolic murmur. No rubs thrills or heaves Abdomen: soft, nontender; no hepatosplenomehaly, BS+; abdominal aorta  nontender and not dilated by palpation. mild central adiposity.  Back: No CVA tenderness Pulses 2+ Extremities: no clubbing cyanosis or edema, Homan's sign negative  Neurologic: grossly nonfocal Psychological: Normal affect and mood  ECG (independently read by me): Sinus tachycardia 103 beats per minute with one PAC. First degree AV block. RV conduction delay.  Prior 11/02/2012 ECG: Normal sinus rhythm at 94beats per minute with a rare PAC. Borderline first-degree block; PR 210 ms. nonspecific T changes.  LABS:  BMET No results found for this basename: na,  k,  cl,  co2,  glucose,  bun,  creatinine,  calcium,  gfrnonaa,  gfraa     Hepatic Function Panel  No results found for this basename: prot,  albumin,  ast,  alt,  alkphos,  bilitot,  bilidir,  ibili     CBC No results found for this basename: wbc,  rbc,  hgb,  hct,  plt,  mcv,  mch,  mchc,  rdw,  neutrabs,  lymphsabs,  monoabs,  eosabs,  basosabs     BNP No results  found for this basename: probnp    Lipid Panel  No results found for this basename: chol,  trig,  hdl,  cholhdl,  vldl,  ldlcalc     RADIOLOGY: No results found.    ASSESSMENT AND PLAN: Thomas Long has a history of documented mild coronary obstructive disease for which she has been on beta blocker therapy in the past. He no longer is on beta blocker therapy due to  previous orthostatic hypotension and marked fatigability. He is not having anginal symptomatology. He does note sharp twinges of chest pain which adenopathy are ischemic. He is not having any recurrent seizure activity. He is tolerating Lipitor 10 mg for his hyperlipidemia. He is now retired. I have encouraged increased activity as tolerated. He does have first degree AV block on ECG with RV conduction delay. He is not orthostatic the blood pressure is improved. I am recommending complete set of laboratory recheck consisting of vitamin D level, magnesium, comprehensive metabolic panel, TSH, lipid  panel, will contact him regarding these results. I will see him in 6 months for cardiology reevaluation   [name], MD, [facility]  04/29/2013 1:46 PM

## 2013-05-02 LAB — VITAMIN D 1,25 DIHYDROXY
Vitamin D 1, 25 (OH)2 Total: 41 pg/mL (ref 18–72)
Vitamin D2 1, 25 (OH)2: <8 pg/mL
Vitamin D3 1, 25 (OH)2: 41 pg/mL

## 2013-06-18 ENCOUNTER — Telehealth: Payer: Self-pay | Admitting: Cardiovascular Disease

## 2013-06-18 DIAGNOSIS — R569 Unspecified convulsions: Secondary | ICD-10-CM

## 2013-06-18 DIAGNOSIS — I639 Cerebral infarction, unspecified: Secondary | ICD-10-CM

## 2013-06-18 NOTE — Telephone Encounter (Signed)
CAlling to get his Aggernox and Atorvastatin refill . [facility]

## 2013-06-19 MED ORDER — ATORVASTATIN CALCIUM 10 MG PO TABS: 10.0000 mg | ORAL_TABLET | Freq: Every day | ORAL | Status: DC

## 2013-06-19 NOTE — Telephone Encounter (Signed)
Refill for atorvastatin sent to pharmacy.  Left VM for patient stating aggrenox refill should to to neurologist - original prescriber.

## 2013-06-25 DIAGNOSIS — H409 Unspecified glaucoma: Secondary | ICD-10-CM | POA: Diagnosis not present

## 2013-06-25 DIAGNOSIS — H4011X0 Primary open-angle glaucoma, stage unspecified: Secondary | ICD-10-CM | POA: Diagnosis not present

## 2013-07-18 DIAGNOSIS — Z1331 Encounter for screening for depression: Secondary | ICD-10-CM | POA: Diagnosis not present

## 2013-07-18 DIAGNOSIS — Z0000 Encounter for general adult medical examination without abnormal findings: Secondary | ICD-10-CM | POA: Diagnosis not present

## 2013-07-18 DIAGNOSIS — Z79899 Other long term (current) drug therapy: Secondary | ICD-10-CM | POA: Diagnosis not present

## 2013-07-18 DIAGNOSIS — I10 Essential (primary) hypertension: Secondary | ICD-10-CM | POA: Diagnosis not present

## 2013-07-18 DIAGNOSIS — E782 Mixed hyperlipidemia: Secondary | ICD-10-CM | POA: Diagnosis not present

## 2013-09-03 DIAGNOSIS — L219 Seborrheic dermatitis, unspecified: Secondary | ICD-10-CM | POA: Diagnosis not present

## 2013-09-03 DIAGNOSIS — L570 Actinic keratosis: Secondary | ICD-10-CM | POA: Diagnosis not present

## 2013-09-03 DIAGNOSIS — L259 Unspecified contact dermatitis, unspecified cause: Secondary | ICD-10-CM | POA: Diagnosis not present

## 2013-09-24 DIAGNOSIS — H4011X0 Primary open-angle glaucoma, stage unspecified: Secondary | ICD-10-CM | POA: Diagnosis not present

## 2013-09-24 DIAGNOSIS — H409 Unspecified glaucoma: Secondary | ICD-10-CM | POA: Diagnosis not present

## 2013-10-03 ENCOUNTER — Ambulatory Visit (INDEPENDENT_AMBULATORY_CARE_PROVIDER_SITE_OTHER): Payer: Medicare Other | Admitting: Neurology

## 2013-10-03 ENCOUNTER — Encounter: Payer: Self-pay | Admitting: Neurology

## 2013-10-03 VITALS — BP 119/75 | HR 100 | Resp 15 | Ht 69.0 in | Wt 207.0 lb

## 2013-10-03 DIAGNOSIS — G40802 Other epilepsy, not intractable, without status epilepticus: Secondary | ICD-10-CM

## 2013-10-03 DIAGNOSIS — G40309 Generalized idiopathic epilepsy and epileptic syndromes, not intractable, without status epilepticus: Secondary | ICD-10-CM | POA: Diagnosis not present

## 2013-10-03 DIAGNOSIS — I6320 Cerebral infarction due to unspecified occlusion or stenosis of unspecified precerebral arteries: Secondary | ICD-10-CM

## 2013-10-03 DIAGNOSIS — I6350 Cerebral infarction due to unspecified occlusion or stenosis of unspecified cerebral artery: Secondary | ICD-10-CM | POA: Diagnosis not present

## 2013-10-03 DIAGNOSIS — G40409 Other generalized epilepsy and epileptic syndromes, not intractable, without status epilepticus: Secondary | ICD-10-CM

## 2013-10-03 MED ORDER — LEVETIRACETAM ER 500 MG PO TB24: 500.0000 mg | ORAL_TABLET | Freq: Every day | ORAL | Status: DC

## 2013-10-03 MED ORDER — MEMANTINE HCL 10 MG PO TABS: ORAL_TABLET | ORAL | Status: DC

## 2013-10-03 NOTE — Progress Notes (Addendum)
[facility]  Provider: CM/  Dr. Leonie Man- seen today by Dr .Brett Fairy  Referring Provider:  Felipa Eth MD   Primary Care Physician:  Antony Contras MD, Cecille Rubin .    Chief Complaint : abnormal carotid doppler scan, RV after 1 year.       HPI:  Thomas Long is a 81 y.o. 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   Seen  here as a referral WID ,He is  Established with Dr. Leonie Man and  Cecille Rubin, last  in June 2013.  The  patient had a small stroke  In 2008 and than a new onset  frontal lobe seizure- so far a single event..  This patient has a history of psoriasis with severe arthritis and was treated with Enbrel until 3 years ago when he developed a facial cellulitis, he has remained untreated for the psoriatic condition since that time. He has a history of nocturnal seizure attributed to to a small frontal lobe infarct on the left side, this was discovered in 2008.  EEG at that time interpreted by Dr. Elvia Collum and demonstrated a focal sharp activity in the left temple area.  He started now on Extended release as the regular form of Keppra caused dizziness and drowsiness. The dorsal is also decreased in comparison to the initial goals. The patient had no recent falls, no weakness or clumsiness, the patient is driving without difficulty and has not gotten lost. He sometimes has delay it worked finding or naming. His wife added that he may have some amnestic spells but there is no short term or recent amnesia. Dr. Georgina Peer is his cardiologist, Dr. Felipa Eth is his primary doctor.  He was assigned by NP carolyn  Hassell Done  And has seen in 2012 and 2013 by  Dr. Leonie Man.  He  underwent a stroke screening and heart disease screening through his church, 3 weeks ago with with a result of moderate carotid artery stenosis bilaterally.  No evidence of PVD and" normal heart  function". The report is not available here, as the patient did not bring it.  Dr Leonie Man is not in the office today and the patient was scheduled with me.  Addendum ; I  obtained the report this PM later than the visit.                      Review of Systems: Out of a complete 14 system review, the patient complains of only the following symptoms, and all other reviewed systems are negative. Geriatric depression score at 2 points, while on keppra. Memory loss. painfull arthritis, psoriasis, single seizure.glaucoma.  Up at 5 AM, Nocturia 2 times night.  Insignificant  "Carotid stenosis" ,  History   Social History  . Marital Status: Married    Spouse Name: Thomas Long    Number of Children: 5  . Years of Education: College   Occupational History  . Not on file.   Social History Main Topics  . Smoking status: Never Smoker   . Smokeless tobacco: Never Used  . Alcohol Use: No  . Drug Use: No  . Sexual Activity: Not on file   Other Topics Concern  . Not on file   Social History Narrative   Patient is married Thomas Long) and lives at home with wife and two children.   Patient has five adult children.   Patient is retired.  Patient has a college education.   Patient is right-handed.   Patient drinks two cups of coffee daily, 1/2 can of soda daily and tea- 2-3 times per week.    Family History  Problem Relation Age of Onset  . Heart attack Father   . Cancer - Lung Sister   . Thyroid disease Child     Past Medical History  Diagnosis Date  . Thyroid disorder   . Depression   . Anxiety   . Diabetes   . High cholesterol   . Dementia   . Hypertension   . Chronic pain   . Heart disease   . Headache(784.0)     migraines  . Seizures   . Stroke   . TIA (transient ischemic attack)   . Hx of echocardiogram 05/2010    showed EF 50% with inferior hypokinesis. He did have mild mitral annular calcification, mild TR, mild aortic sclerosis without stenosis.  Marland Kitchen  History of stress test 05/2010    A moderate perfusion defect is seen in the apical septal, Basal inferoseptal and mid inferoseptal region. post stress EF 53%. Wall motion abnormalities. Abnormal myocardial perfusion study.    Past Surgical History  Procedure Laterality Date  . Facial cellulitis  02/19/09  . Cardiac catheterization  04/01/09    which showed low normal EF at 50% with question of underlying borderline area of minimal inferoapical hypercontractility. He had mild coronary obstructive disease with coronary calcification and segmental 20% narrowing in the LAD proximally, 20% in the mid LAD with muscle bridging. There was calcification at the ostium of the RCA, and 20 to 30%narrowing of the proximal to mid RCA with 30% distal n  . Cholecystectomy      Current Outpatient Prescriptions  Medication Sig Dispense Refill  . atorvastatin (LIPITOR) 10 MG tablet Take 1 tablet (10 mg total) by mouth daily.  90 tablet  2  . dipyridamole-aspirin (AGGRENOX) 200-25 MG per 12 hr capsule Take 1 capsule by mouth 2 (two) times daily.  180 capsule  3  . levETIRAcetam (KEPPRA XR) 500 MG 24 hr tablet Take 1 tablet (500 mg total) by mouth daily.  90 tablet  3  . loratadine (CLARITIN) 10 MG tablet Take 10 mg by mouth daily.      . Multiple Vitamins-Minerals (CENTRUM SILVER ADULT 50+ PO) Take 1 capsule by mouth daily.       No current facility-administered medications for this visit.    Allergies as of 10/03/2013  . (No Known Allergies)    Vitals: BP 119/75  Pulse 100  Resp 15  Ht 5\' 9"  (1.753 m)  Wt 207 lb (93.895 kg)  BMI 30.55 kg/m2 Last Weight:  Wt Readings from Last 1 Encounters:  10/03/13 207 lb (93.895 kg)   Last Height:   Ht Readings from Last 1 Encounters:  10/03/13 5\' 9"  (1.753 m)     Physical exam:  General: The patient is awake, alert and appears not in acute distress. The patient is well groomed. Head: Normocephalic, atraumatic. Neck is supple. Mallampati 1  with  asymmetry of the uvula , lower on the left.  neck circumference: 16.5 inches , no retrognathia.  Cardiovascular:  Regular rate and rhythm, without  murmurs or carotid bruit, and without distended neck veins. Respiratory: Lungs are clear to auscultation. Skin:  Without evidence of edema, or rash Trunk: BMI is elevated , but patient ost a lot of weight, excess skin noted.  The patient has a hunched , propulsive  posture.  Neurologic exam : The patient is awake and alert, oriented to place and time.  Memory subjective  described as mildly impaired for names and otherwise  intact. There is a normal attention span & concentration ability.MOCA 20-30 . Visual spatial and trail making difficulties. Word recall.  Speech is fluent with mild Dysphonia, not aphasia. Mood and affect are appropriate.  Cranial nerves: Pupils are  equal and briskly reactive to light.  He was unable to smoothly persue a moving object , appears to have central scotoma.   Extraocular movements  in vertical and horizontal planes without nystagmus. Hearing to finger rub intact.   Facial sensation intact to fine touch.  Facial motor strength is symmetric and tongue  Midline, but uvula is lower on the left .  Motor exam:  Normal tone and normal muscle bulk and symmetric normal strength in all extremities.  He has pain and related provision of lesser grip strength.   Sensory:  Fine touch, pinprick and vibration were tested in all extremities. Proprioception is tested ,was  normal.  Coordination: Rapid alternating movements in the fingers/hands is tested and normal.  Finger-to-nose maneuver tested and normal without evidence of ataxia, dysmetria or tremor.  Gait and station: Patient walks without assistive device, able to climb up to the exam table. Stance is stable and normal.  Steps are wide , fragmented turns of 3-4 steps.  Loss of arm swing on the left more than right. , but wide based .  He needed no assistence.  Romberg  testing is positive- propulsive. .  Deep tendon reflexes: in the  upper and lower extremities are symmetric and intact. Babinski maneuver response is downgoing   downgoing.   Assessment:  After physical and neurologic examination, review of laboratory studies, imaging, neurophysiology testing and pre-existing records,    I had asked his primary neurologist , Dr Leonie Man,  to  evaluate the outside carotid doppler reports in 2014 . He has no known recent stroke, and had an MRI brain 2014  normal for age.   MMSE was 29-30 in 2014 and today MOCA 20-30 , indicative of beginning dementia.  Embel was d/c and he has now worsening joint pain.  Diastolic heart weakness, Dr. Claiborne Billings, atrial fibrillation., normotensive .    Plan:  Treatment plan and additional workup will be reviewed under Problem List.   I refilled his Keppra . I started Namenda , 10 mg once a day for 14 days then followed 1 tab bid po.  RV with Cecille Rubin and Dr Leonie Man, in 2-3 month .Marland Kitchen

## 2013-10-04 ENCOUNTER — Telehealth: Payer: Self-pay | Admitting: Neurology

## 2013-10-04 MED ORDER — ASPIRIN-DIPYRIDAMOLE ER 25-200 MG PO CP12: 1.0000 | ORAL_CAPSULE | Freq: Two times a day (BID) | ORAL | Status: DC

## 2013-10-04 NOTE — Telephone Encounter (Signed)
Patient needs refill of Aggrenox. [facility] uses [facility], [place] [phone] (Phone) [phone] (Fax)

## 2013-10-10 ENCOUNTER — Encounter: Payer: Self-pay | Admitting: Nurse Practitioner

## 2013-11-16 DIAGNOSIS — Z23 Encounter for immunization: Secondary | ICD-10-CM | POA: Diagnosis not present

## 2013-12-21 ENCOUNTER — Encounter: Payer: Self-pay | Admitting: Adult Health

## 2013-12-21 ENCOUNTER — Ambulatory Visit (INDEPENDENT_AMBULATORY_CARE_PROVIDER_SITE_OTHER): Payer: Medicare Other | Admitting: Adult Health

## 2013-12-21 VITALS — BP 130/76 | HR 104 | Temp 97.4°F | Ht 68.0 in | Wt 203.0 lb

## 2013-12-21 DIAGNOSIS — R569 Unspecified convulsions: Secondary | ICD-10-CM | POA: Diagnosis not present

## 2013-12-21 DIAGNOSIS — I639 Cerebral infarction, unspecified: Secondary | ICD-10-CM | POA: Diagnosis not present

## 2013-12-21 DIAGNOSIS — R413 Other amnesia: Secondary | ICD-10-CM | POA: Diagnosis not present

## 2013-12-21 DIAGNOSIS — Z8673 Personal history of transient ischemic attack (TIA), and cerebral infarction without residual deficits: Secondary | ICD-10-CM | POA: Diagnosis not present

## 2013-12-21 MED ORDER — MEMANTINE HCL 10 MG PO TABS: 10.0000 mg | ORAL_TABLET | Freq: Two times a day (BID) | ORAL | Status: DC

## 2013-12-21 NOTE — Progress Notes (Signed)
PATIENT: Thomas Long DOB: [birth year]  REASON FOR VISIT: follow up HISTORY FROM: patient  HISTORY OF PRESENT ILLNESS: Thomas Long is a 80 year old male with a history of  Seizures and small stroke. He returns today for follow-up. He is currently taking Keppra XL and tolerating it well. He denies any recent seizure events. His seizure was during the night and it was a grand mal according to the wife. This is the only seizure he has had. They found that he had a small stroke at the same time. He operates a Teacher, music without difficulty. The patient was started on Namenda for memory loss. He reports that his memory has improved with namenda. His wife has noticed improvement as well.  Denies having to give up anything due to his memory. He continues to do the finances at home. He has a hard recalling things but eventually it will come back to him. In 2008 his MRI showed a small infarct posterior aspect of the left lenticular nucleus. He is on aggrenox. His PCP Dr. Felipa Eth manages his BP and cholesterol. Patient remains active. He continues to visit "shut-ins" at his church.                          HISTORY 10/03/13 (Dohmeier): Thomas Long is a 79 y.o. 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  He is stil occasionally working and driving to his parishioners. Seen  here as a referral WID ,He is  Established with Dr. Leonie Man and  Cecille Rubin, last  in June 2013.  The  patient had a small stroke  In 2008 and than a new onset  frontal lobe seizure- so far a single event..  This patient has a history of psoriasis with severe arthritis and was treated with Enbrel until 3 years ago when he developed a facial cellulitis, he has remained untreated for the psoriatic condition since that time. He has a history of nocturnal seizure attributed to to a small frontal lobe infarct on the left side, this was discovered in 2008.  EEG at that time interpreted by Dr. Elvia Collum  and demonstrated a focal sharp activity in the left temple area.  He started now on Extended release as the regular form of Keppra caused dizziness and drowsiness. The dorsal is also decreased in comparison to the initial goals. The patient had no recent falls, no weakness or clumsiness, the patient is driving without difficulty and has not gotten lost. He sometimes has delay it worked finding or naming. His wife added that he may have some amnestic spells but there is no short term or recent amnesia.Dr. Georgina Peer is his cardiologist, Dr. Felipa Eth is his primary doctor.He was assigned by NP carolyn  Hassell Done  And has seen in 2012 and 2013 by  Dr. Leonie Man. He  underwent a stroke screening and heart disease screening through his church, 3 weeks ago with with a result of moderate carotid artery stenosis bilaterally. No evidence of PVD and" normal heart function".The report is not available here, as the patient did not bring it.  Dr Leonie Man is not in the office today and the patient was scheduled with me. Addendum ; I  obtained the report this PM later than the visit.   REVIEW OF SYSTEMS: Out of a complete 14 system review of symptoms, the patient complains only of the following symptoms, and all other reviewed systems are negative  Eye  discharge Environmental allergies Memory loss    ALLERGIES: No Known Allergies  HOME MEDICATIONS: Outpatient Prescriptions Prior to Visit  Medication Sig Dispense Refill  . atorvastatin (LIPITOR) 10 MG tablet Take 1 tablet (10 mg total) by mouth daily. 90 tablet 2  . dipyridamole-aspirin (AGGRENOX) 200-25 MG per 12 hr capsule Take 1 capsule by mouth 2 (two) times daily. 180 capsule 1  . levETIRAcetam (KEPPRA XR) 500 MG 24 hr tablet Take 1 tablet (500 mg total) by mouth daily. 90 tablet 3  . loratadine (CLARITIN) 10 MG tablet Take 10 mg by mouth daily.    . memantine (NAMENDA) 10 MG tablet Take one in AM for two weeks, then take one twice a day by mouth. 60 tablet 5  .  Multiple Vitamins-Minerals (CENTRUM SILVER ADULT 50+ PO) Take 1 capsule by mouth daily.     No facility-administered medications prior to visit.    PAST MEDICAL HISTORY: Past Medical History  Diagnosis Date  . Thyroid disorder   . Depression   . Anxiety   . Diabetes   . High cholesterol   . Dementia   . Hypertension   . Chronic pain   . Heart disease   . Headache(784.0)     migraines  . Seizures   . Stroke   . TIA (transient ischemic attack)   . Hx of echocardiogram 05/2010    showed EF 50% with inferior hypokinesis. He did have mild mitral annular calcification, mild TR, mild aortic sclerosis without stenosis.  Marland Kitchen History of stress test 05/2010    A moderate perfusion defect is seen in the apical septal, Basal inferoseptal and mid inferoseptal region. post stress EF 53%. Wall motion abnormalities. Abnormal myocardial perfusion study.    PAST SURGICAL HISTORY: Past Surgical History  Procedure Laterality Date  . Facial cellulitis  02/19/09  . Cardiac catheterization  04/01/09    which showed low normal EF at 50% with question of underlying borderline area of minimal inferoapical hypercontractility. He had mild coronary obstructive disease with coronary calcification and segmental 20% narrowing in the LAD proximally, 20% in the mid LAD with muscle bridging. There was calcification at the ostium of the RCA, and 20 to 30%narrowing of the proximal to mid RCA with 30% distal n  . Cholecystectomy      FAMILY HISTORY: Family History  Problem Relation Age of Onset  . Heart attack Father   . Cancer - Lung Sister   . Thyroid disease Child     SOCIAL HISTORY: History   Social History  . Marital Status: Married    Spouse Name: [name]    Number of Children: 5  . Years of Education: College   Occupational History  . Not on file.   Social History Main Topics  . Smoking status: Never Smoker   . Smokeless tobacco: Never Used  . Alcohol Use: No  . Drug Use: No  . Sexual  Activity: Not on file   Other Topics Concern  . Not on file   Social History Narrative   Patient is married [name]) and lives at home with wife and two children.   Patient has five adult children.   Patient is retired.   Patient has a college education.   Patient is right-handed.   Patient drinks two cups of coffee daily, 1/2 can of soda daily and tea- 2-3 times per week.      PHYSICAL EXAM  Filed Vitals:   12/21/13 0852  BP: 130/76  Pulse: 104  Temp: 97.4 F (36.3 C)  TempSrc: Oral  Height: 5\' 8"  (1.727 m)  Weight: 203 lb (92.08 kg)   Body mass index is 30.87 kg/(m^2).  Generalized: Well developed, in no acute distress   Neurological examination  Mentation: Alert oriented to time, place, history taking. Follows all commands speech and language fluent Cranial nerve II-XII: Pupils were equal round reactive to light. Extraocular movements were full, visual field were full on confrontational test. Facial sensation and strength were normal.  Uvula tongue midline. Head turning and shoulder shrug  were normal and symmetric. Motor: The motor testing reveals 5 over 5 strength of all 4 extremities. Good symmetric motor tone is noted throughout.  Sensory: Sensory testing is intact to soft touch on all 4 extremities. No evidence of extinction is noted.  Coordination: Cerebellar testing reveals good finger-nose-finger and heel-to-shin bilaterally.  Gait and station: Gait slow and cautious. Tandem gait not attempted. Romberg is negative. No drift is seen.  Reflexes: Deep tendon reflexes are symmetric and normal bilaterally.   DIAGNOSTIC DATA (LABS, IMAGING, TESTING) - I reviewed patient records, labs, notes, testing and imaging myself where available.  Lab Results  Component Value Date   WBC 7.2 04/25/2013   HGB 13.6 04/25/2013   HCT 38.8* 04/25/2013   MCV 92.4 04/25/2013   PLT 354 04/25/2013      Component Value Date/Time   NA 138 04/25/2013 0803   K 4.5 04/25/2013 0803    CL 102 04/25/2013 0803   CO2 31 04/25/2013 0803   GLUCOSE 104* 04/25/2013 0803   BUN 18 04/25/2013 0803   CREATININE 0.84 04/25/2013 0803   CALCIUM 9.2 04/25/2013 0803   PROT 6.8 04/25/2013 0803   ALBUMIN 4.1 04/25/2013 0803   AST 20 04/25/2013 0803   ALT 13 04/25/2013 0803   ALKPHOS 51 04/25/2013 0803   BILITOT 0.9 04/25/2013 0803   Lab Results  Component Value Date   CHOL 128 04/25/2013   HDL 45 04/25/2013   LDLCALC 61 04/25/2013   TRIG 110 04/25/2013   CHOLHDL 2.8 04/25/2013   No results found for: HGBA1C No results found for: VITAMINB12 Lab Results  Component Value Date   TSH 0.994 04/25/2013      ASSESSMENT AND PLAN 80 y.o. year old male  has a past medical history of Thyroid disorder; Depression; Anxiety; Diabetes; High cholesterol; Dementia; Hypertension; Chronic pain; Heart disease; Headache(784.0); Seizures; Stroke; TIA (transient ischemic attack); echocardiogram (05/2010); and History of stress test (05/2010). here with;  1. Seizures 2. Memory loss 3. History of stroke  Overall the patient is doing well. He has not had any additional seizures. He continues to take Keppra XL. The patient was started on Namenda at the last visit. His memory score today is MOCA 22/30 and was previously 20/30. The patient and his wife have noticed improvement in his memory since starting [place]. I will refill this today. The patient did have a history of a stroke in 2008. He did not suffer any residual effects from the stroke. He continues to take Aggrenox. His blood pressure and cholesterol are managed by his primary care provider. If his symptoms worsen or he develops new symptoms he should let us know. Otherwise he will follow-up in 6 months or sooner if needed.     Ward Givens, MSN, NP-C 12/21/2013, 9:34 AM [facility] [address], [place], [place] [address] [phone]  Note: This document was prepared with digital dictation and possible  smart phrase technology. Any transcriptional  errors that result from this process are unintentional.

## 2013-12-21 NOTE — Progress Notes (Signed)
I agree with the assessment and plan as directed by NP .The patient is known to me .   Alban Marucci, MD  

## 2013-12-21 NOTE — Patient Instructions (Signed)

## 2013-12-24 DIAGNOSIS — H4011X1 Primary open-angle glaucoma, mild stage: Secondary | ICD-10-CM | POA: Diagnosis not present

## 2014-01-09 ENCOUNTER — Ambulatory Visit: Payer: Medicare Other | Admitting: Nurse Practitioner

## 2014-01-17 DIAGNOSIS — Z23 Encounter for immunization: Secondary | ICD-10-CM | POA: Diagnosis not present

## 2014-01-17 DIAGNOSIS — Z1389 Encounter for screening for other disorder: Secondary | ICD-10-CM | POA: Diagnosis not present

## 2014-01-17 DIAGNOSIS — E782 Mixed hyperlipidemia: Secondary | ICD-10-CM | POA: Diagnosis not present

## 2014-01-17 DIAGNOSIS — I10 Essential (primary) hypertension: Secondary | ICD-10-CM | POA: Diagnosis not present

## 2014-01-28 ENCOUNTER — Other Ambulatory Visit: Payer: Self-pay | Admitting: Neurology

## 2014-02-08 DIAGNOSIS — I509 Heart failure, unspecified: Secondary | ICD-10-CM

## 2014-02-08 HISTORY — DX: Heart failure, unspecified: I50.9

## 2014-03-04 DIAGNOSIS — L308 Other specified dermatitis: Secondary | ICD-10-CM | POA: Diagnosis not present

## 2014-03-20 ENCOUNTER — Other Ambulatory Visit: Payer: Self-pay

## 2014-03-20 MED ORDER — ATORVASTATIN CALCIUM 10 MG PO TABS: 10.0000 mg | ORAL_TABLET | Freq: Every day | ORAL | Status: DC

## 2014-04-01 DIAGNOSIS — Z961 Presence of intraocular lens: Secondary | ICD-10-CM | POA: Diagnosis not present

## 2014-04-01 DIAGNOSIS — H4011X1 Primary open-angle glaucoma, mild stage: Secondary | ICD-10-CM | POA: Diagnosis not present

## 2014-04-29 ENCOUNTER — Telehealth: Payer: Self-pay | Admitting: Cardiovascular Disease

## 2014-04-29 MED ORDER — ASPIRIN-DIPYRIDAMOLE ER 25-200 MG PO CP12: 1.0000 | ORAL_CAPSULE | Freq: Two times a day (BID) | ORAL | Status: DC

## 2014-04-29 NOTE — Telephone Encounter (Signed)
Spoke with pt, aware refill sent electronically to pharmacy.

## 2014-04-29 NOTE — Telephone Encounter (Signed)
°  1. Which medications need to be refilled? Aggrenox  2. Which pharmacy is medication to be sent to?[facility]  3. Do they need a 30 day or 90 day supply? 60 day  4. Would they like a call back once the medication has been sent to the pharmacy? yes

## 2014-04-30 ENCOUNTER — Telehealth: Payer: Self-pay | Admitting: Cardiovascular Disease

## 2014-04-30 MED ORDER — ATORVASTATIN CALCIUM 10 MG PO TABS: 10.0000 mg | ORAL_TABLET | Freq: Every day | ORAL | Status: DC

## 2014-04-30 NOTE — Telephone Encounter (Signed)
°  1. Which medications need to be refilled? Atorvastatin 10mg    2. Which pharmacy is medication to be sent to[facility]  3. Do they need a 30 day or 90 day supply? 90   4. Would they like a call back once the medication has been sent to the pharmacy? Yes

## 2014-04-30 NOTE — Telephone Encounter (Signed)
Refill submitted to patient's preferred pharmacy. Informed patient. Pt voiced understanding, no other stated concerns at this time.  

## 2014-07-01 DIAGNOSIS — H4011X1 Primary open-angle glaucoma, mild stage: Secondary | ICD-10-CM | POA: Diagnosis not present

## 2014-07-09 ENCOUNTER — Encounter: Payer: Self-pay | Admitting: Cardiovascular Disease

## 2014-07-09 ENCOUNTER — Ambulatory Visit (INDEPENDENT_AMBULATORY_CARE_PROVIDER_SITE_OTHER): Payer: Medicare Other | Admitting: Cardiovascular Disease

## 2014-07-09 VITALS — BP 110/62 | HR 98 | Ht 69.0 in | Wt 204.8 lb

## 2014-07-09 DIAGNOSIS — Z79899 Other long term (current) drug therapy: Secondary | ICD-10-CM | POA: Diagnosis not present

## 2014-07-09 DIAGNOSIS — R0602 Shortness of breath: Secondary | ICD-10-CM | POA: Diagnosis not present

## 2014-07-09 DIAGNOSIS — I2510 Atherosclerotic heart disease of native coronary artery without angina pectoris: Secondary | ICD-10-CM | POA: Diagnosis not present

## 2014-07-09 DIAGNOSIS — E785 Hyperlipidemia, unspecified: Secondary | ICD-10-CM | POA: Diagnosis not present

## 2014-07-09 DIAGNOSIS — R0789 Other chest pain: Secondary | ICD-10-CM

## 2014-07-09 MED ORDER — ATORVASTATIN CALCIUM 10 MG PO TABS: 10.0000 mg | ORAL_TABLET | Freq: Every day | ORAL | Status: DC

## 2014-07-09 NOTE — Patient Instructions (Signed)
Your physician has requested that you have an echocardiogram. Echocardiography is a painless test that uses sound waves to create images of your heart. It provides your doctor with information about the size and shape of your heart and how well your heart's chambers and valves are working. This procedure takes approximately one hour. There are no restrictions for this procedure.  Your physician recommends that you return for lab work FASTING.  Your physician recommends that you schedule a follow-up appointment in: 3 MONTHS.

## 2014-07-10 ENCOUNTER — Other Ambulatory Visit: Payer: Self-pay | Admitting: Adult Health

## 2014-07-11 ENCOUNTER — Encounter: Payer: Self-pay | Admitting: Cardiovascular Disease

## 2014-07-11 DIAGNOSIS — R0789 Other chest pain: Secondary | ICD-10-CM | POA: Insufficient documentation

## 2014-07-11 NOTE — Progress Notes (Signed)
Patient ID: Thomas Long, male   DOB: 04-30-1931, 81 y.o.   MRN: [id number]    HPI: Thomas Long is a 81 y.o. male who presents to the office today for a one year followup cardiology evaluation.    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  In 2011 cardiac catheterization revealed ejection fraction at 50% with mild coronary artery disease coronary calcification with segmental 20% narrowing in the proximal LAD and mid LAD with mild muscle bridging, calcification at the ostium of the right coronary artery with 20-30% narrowing in the proximal to mid RCA and 30% distal narrowing. He has a history of a TIA with possible seizure and has been on Aggrenox and Keppra and is currently followed by neurology. An echo Doppler study in April 2012 showed an ejection fraction of 50% with inferior hypokinesis, mild mitral annular calcification and mild TR, mild aortic sclerosis without stenosis. I had seen him on 05/30/2012 with complaints of increased fatigability as well as shortness of breath with less activity.  A two-year followup echo Doppler study on 07/06/2012 which showed mild LVH. Ejection fraction was 50-55% and again there was mild hypokinesis of the basal mid inferior myocardium and  grade 1 diastolic dysfunction. He had minimal pulmonary hypertension with PA pressure 32 mm, mitral annular calcification with trivial MR. His aortic valve was mildly sclerotic without stenosis.  When I saw on 08/01/2012 he had orthostatic symptoms and was hypotensive. At that time, I recommended that he weaned slowly and ultimately discontinue his Bystolic. He has felt improved off this therapy. He denies any awareness of tachycardia palpitations or pulse irregularity.  Since I last saw him in March 2015, he has been without exertional chest pain.  He does admit to some aching nonexertional chest pain, particularly when he lies down, which lasts seconds to minutes. He does note some shortness of breath  particularly with activity.  He denies any recent seizure activity.  He has had some difficulty with early memory loss and has been started on Namenda 10 mg twice a day by neurology.  He continues to take atorvastatin 10 mg for hyperlipidemia.  He is unaware of palpitations.  Past Medical History  Diagnosis Date  . Thyroid disorder   . Depression   . Anxiety   . Diabetes   . High cholesterol   . Dementia   . Hypertension   . Chronic pain   . Heart disease   . Headache(784.0)     migraines  . Seizures   . Stroke   . TIA (transient ischemic attack)   . Hx of echocardiogram 05/2010    showed EF 50% with inferior hypokinesis. He did have mild mitral annular calcification, mild TR, mild aortic sclerosis without stenosis.  Marland Kitchen History of stress test 05/2010    A moderate perfusion defect is seen in the apical septal, Basal inferoseptal and mid inferoseptal region. post stress EF 53%. Wall motion abnormalities. Abnormal myocardial perfusion study.    Past Surgical History  Procedure Laterality Date  . Facial cellulitis  02/19/09  . Cardiac catheterization  04/01/09    which showed low normal EF at 50% with question of underlying borderline area of minimal inferoapical hypercontractility. He had mild coronary obstructive disease with coronary calcification and segmental 20% narrowing in the LAD proximally, 20% in the mid LAD with muscle bridging. There was calcification at the ostium of the RCA, and 20 to 30%narrowing of the proximal to mid RCA with 30% distal  n  . Cholecystectomy      No Known Allergies  Current Outpatient Prescriptions  Medication Sig Dispense Refill  . AGGRENOX 25-200 MG per 12 hr capsule TAKE 1 CAPSULE TWICE DAILY 180 capsule 3  . atorvastatin (LIPITOR) 10 MG tablet Take 1 tablet (10 mg total) by mouth daily. 90 tablet 0  . dipyridamole-aspirin (AGGRENOX) 200-25 MG per 12 hr capsule Take 1 capsule by mouth 2 (two) times daily. 180 capsule 1  . levETIRAcetam (KEPPRA  XR) 500 MG 24 hr tablet Take 1 tablet (500 mg total) by mouth daily. 90 tablet 3  . loratadine (CLARITIN) 10 MG tablet Take 10 mg by mouth daily.    . Multiple Vitamins-Minerals (CENTRUM SILVER ADULT 50+ PO) Take 1 capsule by mouth daily.    Marland Kitchen ketoconazole (NIZORAL) 2 % shampoo AS NEEDED  5  . latanoprost (XALATAN) 0.005 % ophthalmic solution   3  . memantine (NAMENDA) 10 MG tablet TAKE 1 TABLET TWICE A DAY 180 tablet 0   No current facility-administered medications for this visit.    Socially he is married has 5 children 6 grandchildren. He typically goes to bed approximately 8:30 at night and wakes up at approximately 4 AM in the morning. In the early morning he spends time in prayer and then typically attends 7 AM Mass. Since I last saw him he is retired from his [employer].  ROS General: Negative; No fevers, chills, or night sweats;  HEENT: Negative; No changes in vision or hearing, sinus congestion, difficulty swallowing Pulmonary: Negative; No cough, wheezing, shortness of breath, hemoptysis Cardiovascular: See history of present illness GI: Negative; No nausea, vomiting, diarrhea, or abdominal pain GU: Negative; No dysuria, hematuria, or difficulty voiding Musculoskeletal: Negative; no myalgias, joint pain, or weakness Hematologic/Oncology: Negative; no easy bruising, bleeding Endocrine: Negative; no heat/cold intolerance; no diabetes Neuro: No recent seizures. Skin: Negative; No rashes or skin lesions Psychiatric: Negative; No behavioral problems, depression Sleep: Negative; No snoring, daytime sleepiness, hypersomnolence, bruxism, restless legs, hypnogognic hallucinations, no cataplexy Other comprehensive 14 point system review is negative.   PE BP 110/62 mmHg  Pulse 98  Ht '5\' 9"'  (1.753 m)  Wt 204 lb 12.8 oz (92.897 kg)  BMI 30.23 kg/m2    Repeat blood pressure by me was 100/60 supine and was 106/60 standing.   Wt Readings from Last 3 Encounters:  07/09/14 204 lb  12.8 oz (92.897 kg)  12/21/13 203 lb (92.08 kg)  10/03/13 207 lb (93.895 kg)   General: Alert, oriented, no distress.  Skin: normal turgor, no rashes HEENT: Normocephalic, atraumatic. Pupils round and reactive; sclera anicteric;no lid lag.  Nose without nasal septal hypertrophy no xanthelasmas Mouth/Parynx benign; Mallinpatti scale 2 Neck: No JVD, no carotid bruits with normal carotid upstroke. Chest wall: Nontender to palpation Lungs: clear to ausculatation and percussion; no wheezing or rales Heart: RRR, s1 s2 normal  1/6 systolic murmur, unchanged. No diastolic murmur. No rubs thrills or heaves Abdomen: soft, nontender; no hepatosplenomehaly, BS+; abdominal aorta nontender and not dilated by palpation. mild central adiposity.  Back: No CVA tenderness Pulses 2+ Extremities: no clubbing cyanosis or edema, Homan's sign negative  Neurologic: grossly nonfocal Psychological: Normal affect and mood  ECG (independently read by me): Sinus rhythm at 98 bpm.  Occasional PVC.  No ST segment changes.  QTc interval 472 ms.  First-degree AV block with a PR interval at 230 ms.  March 2015 ECG (independently read by me): Sinus tachycardia 103 beats per minute with one PAC.  First degree AV block. RV conduction delay.  Prior 11/02/2012 ECG: Normal sinus rhythm at 94beats per minute with a rare PAC. Borderline first-degree block; PR 210 ms. nonspecific T changes.  LABS: BMP Latest Ref Rng 04/25/2013  Glucose 70 - 99 mg/dL 104(H)  BUN 6 - 23 mg/dL 18  Creatinine 0.50 - 1.35 mg/dL 0.84  Sodium 135 - 145 mEq/L 138  Potassium 3.5 - 5.3 mEq/L 4.5  Chloride 96 - 112 mEq/L 102  CO2 19 - 32 mEq/L 31  Calcium 8.4 - 10.5 mg/dL 9.2   Hepatic Function Latest Ref Rng 04/25/2013  Total Protein 6.0 - 8.3 g/dL 6.8  Albumin 3.5 - 5.2 g/dL 4.1  AST 0 - 37 U/L 20  ALT 0 - 53 U/L 13  Alk Phosphatase 39 - 117 U/L 51  Total Bilirubin 0.2 - 1.2 mg/dL 0.9   CBC Latest Ref Rng 04/25/2013  WBC 4.0 - 10.5 K/uL 7.2    Hemoglobin 13.0 - 17.0 g/dL 13.6  Hematocrit 39.0 - 52.0 % 38.8(L)  Platelets 150 - 400 K/uL 354   Lab Results  Component Value Date   MCV 92.4 04/25/2013   Lab Results  Component Value Date   TSH 0.994 04/25/2013  No results found for: HGBA1C  Lipid Panel     Component Value Date/Time   CHOL 128 04/25/2013 0803   TRIG 110 04/25/2013 0803   HDL 45 04/25/2013 0803   CHOLHDL 2.8 04/25/2013 0803   VLDL 22 04/25/2013 0803   LDLCALC 61 04/25/2013 0803   RADIOLOGY: No results found.    ASSESSMENT AND PLAN: Thomas Long has a history of documented mild CAD and previously was on beta blocker therapy in the past. He no longer is on beta blocker therapy due to  previous orthostatic hypotension and marked fatigability. He is not having anginal symptomatology.  He denies any classic exertional chest pain.  He admits to nonexertional chest pain.  Sensation which last for seconds or minutes and seem to occur more 1 on his back.  He's not had any seizure activity recently and is controlled on his Keppra.  He continues to take atorvastatin 10 mg daily for hyperlipidemia.  He has been on Aggrenox following his remote TIA.  He continues to experience some shortness of breath with activity.  I will schedule him for 2 year follow-up echo.  Dr. Crist Fat to reassess his systolic and diastolic function as well as valvular architecture.  He does have isolated PVCs on ECG today.  His blood pressure is somewhat low and at this point, I will not reinstitute beta blocker therapy.  I am recommending laboratory be obtained in the fasting state.  I will see him in 3 months for reevaluation.  [name], MD, [facility]  07/11/2014 7:06 PM

## 2014-07-22 DIAGNOSIS — F0390 Unspecified dementia without behavioral disturbance: Secondary | ICD-10-CM | POA: Diagnosis not present

## 2014-07-22 DIAGNOSIS — Z0000 Encounter for general adult medical examination without abnormal findings: Secondary | ICD-10-CM | POA: Diagnosis not present

## 2014-07-22 DIAGNOSIS — R569 Unspecified convulsions: Secondary | ICD-10-CM | POA: Diagnosis not present

## 2014-07-22 DIAGNOSIS — E669 Obesity, unspecified: Secondary | ICD-10-CM | POA: Diagnosis not present

## 2014-07-22 DIAGNOSIS — E782 Mixed hyperlipidemia: Secondary | ICD-10-CM | POA: Diagnosis not present

## 2014-07-22 DIAGNOSIS — Z79899 Other long term (current) drug therapy: Secondary | ICD-10-CM | POA: Diagnosis not present

## 2014-07-22 DIAGNOSIS — I10 Essential (primary) hypertension: Secondary | ICD-10-CM | POA: Diagnosis not present

## 2014-07-22 DIAGNOSIS — Z1389 Encounter for screening for other disorder: Secondary | ICD-10-CM | POA: Diagnosis not present

## 2014-07-22 DIAGNOSIS — Z23 Encounter for immunization: Secondary | ICD-10-CM | POA: Diagnosis not present

## 2014-07-22 DIAGNOSIS — I499 Cardiac arrhythmia, unspecified: Secondary | ICD-10-CM | POA: Diagnosis not present

## 2014-07-30 ENCOUNTER — Encounter: Payer: Self-pay | Admitting: Adult Health

## 2014-07-30 ENCOUNTER — Ambulatory Visit (INDEPENDENT_AMBULATORY_CARE_PROVIDER_SITE_OTHER): Payer: Medicare Other | Admitting: Adult Health

## 2014-07-30 VITALS — BP 130/72 | HR 98 | Ht 69.0 in | Wt 203.0 lb

## 2014-07-30 DIAGNOSIS — Z8673 Personal history of transient ischemic attack (TIA), and cerebral infarction without residual deficits: Secondary | ICD-10-CM | POA: Diagnosis not present

## 2014-07-30 DIAGNOSIS — R569 Unspecified convulsions: Secondary | ICD-10-CM

## 2014-07-30 DIAGNOSIS — R413 Other amnesia: Secondary | ICD-10-CM

## 2014-07-30 NOTE — Progress Notes (Signed)
PATIENT: Thomas Long DOB: 06-Sep-1931  REASON FOR VISIT: follow up- seizures, memory loss, history of stroke HISTORY FROM: patient  HISTORY OF PRESENT ILLNESS: Thomas Long is an 81 year old male with a history of seizures, stroke and memory loss. He returns today for follow-up. The patient is currently taking Keppra XL for seizures and tolerating it well. The patient denies any seizure events. The patient is also on Namenda for memory loss. He reports that his memory has remained stable. He continues to operate a motor vehicle without difficulty. Denies having to give up anything due to his memory. He is able to complete all ADLs independently. The patient did have a stroke in 2008. Since then he has not expressed any strokelike symptoms. He is on Aggrenox. His primary care provider manages his blood pressure and cholesterol. Patient denies any new medical issues. He returns today for an evaluation.   HISTORY 12/21/13: Thomas Long is a 81 year old male with a history of Seizures and small stroke. He returns today for follow-up. He is currently taking Keppra XL and tolerating it well. He denies any recent seizure events. His seizure was during the night and it was a grand mal according to the wife. This is the only seizure he has had. They found that he had a small stroke at the same time. He operates a Teacher, music without difficulty. The patient was started on Namenda for memory loss. He reports that his memory has improved with namenda. His wife has noticed improvement as well. Denies having to give up anything due to his memory. He continues to do the finances at home. He has a hard recalling things but eventually it will come back to him. In 2008 his MRI showed a small infarct posterior aspect of the left lenticular nucleus. He is on aggrenox. His PCP Dr. Felipa Eth manages his BP and cholesterol. Patient remains active. He continues to visit "shut-ins" at his church.     HISTORY 10/03/13 ([place]ier): Thomas Long is a 81 y.o. 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 He is stil occasionally working and driving to his parishioners.Seen here as a referral WID ,He is Established with Dr. Leonie Man and Cecille Rubin, last in June 2013. The patient had a small stroke In 2008 and than a new onset frontal lobe seizure- so far a single event.. This patient has a history of psoriasis with severe arthritis and was treated with Enbrel until 3 years ago when he developed a facial cellulitis, he has remained untreated for the psoriatic condition since that time. He has a history of nocturnal seizure attributed to to a small frontal lobe infarct on the left side, this was discovered in 2008. EEG at that time interpreted by Dr. Elvia Collum and demonstrated a focal sharp activity in the left temple area. He started now on Extended release as the regular form of Keppra caused dizziness and drowsiness. The dorsal is also decreased in comparison to the initial goals. The patient had no recent falls, no weakness or clumsiness, the patient is driving without difficulty and has not gotten lost. He sometimes has delay it worked finding or naming. His wife added that he may have some amnestic spells but there is no short term or recent amnesia.Dr. Georgina Peer is his cardiologist, Dr. Felipa Eth is his primary doctor.He was assigned by NP carolyn Hassell Done And has seen in 2012 and 2013 by Dr. Leonie Man.He underwent a stroke screening and heart disease screening through his church,  3 weeks ago with with a result of moderate carotid artery stenosis bilaterally.No evidence of PVD and" normal heart function".The report is not available here, as the patient did not bring it. Dr Leonie Man is not in the office today and the patient was scheduled with me. Addendum ; I obtained the report this PM later than the visit.   REVIEW OF SYSTEMS: Out of a complete 14 system  review of symptoms, the patient complains only of the following symptoms, and all other reviewed systems are negative.  Shortness of breath, cold intolerance, heat intolerance, daytime sleepiness, environmental allergies, joint pain, bruise/bleed easily  ALLERGIES: No Known Allergies  HOME MEDICATIONS: Outpatient Prescriptions Prior to Visit  Medication Sig Dispense Refill  . AGGRENOX 25-200 MG per 12 hr capsule TAKE 1 CAPSULE TWICE DAILY 180 capsule 3  . atorvastatin (LIPITOR) 10 MG tablet Take 1 tablet (10 mg total) by mouth daily. 90 tablet 0  . ketoconazole (NIZORAL) 2 % shampoo AS NEEDED  5  . latanoprost (XALATAN) 0.005 % ophthalmic solution   3  . levETIRAcetam (KEPPRA XR) 500 MG 24 hr tablet Take 1 tablet (500 mg total) by mouth daily. 90 tablet 3  . loratadine (CLARITIN) 10 MG tablet Take 10 mg by mouth daily.    . memantine (NAMENDA) 10 MG tablet TAKE 1 TABLET TWICE A DAY 180 tablet 0  . Multiple Vitamins-Minerals (CENTRUM SILVER ADULT 50+ PO) Take 1 capsule by mouth daily.    Marland Kitchen dipyridamole-aspirin (AGGRENOX) 200-25 MG per 12 hr capsule Take 1 capsule by mouth 2 (two) times daily. (Patient not taking: Reported on 07/30/2014) 180 capsule 1   No facility-administered medications prior to visit.    PAST MEDICAL HISTORY: Past Medical History  Diagnosis Date  . Thyroid disorder   . Depression   . Anxiety   . Diabetes   . High cholesterol   . Dementia   . Hypertension   . Chronic pain   . Heart disease   . Headache(784.0)     migraines  . Seizures   . Stroke   . TIA (transient ischemic attack)   . Hx of echocardiogram 05/2010    showed EF 50% with inferior hypokinesis. He did have mild mitral annular calcification, mild TR, mild aortic sclerosis without stenosis.  Marland Kitchen History of stress test 05/2010    A moderate perfusion defect is seen in the apical septal, Basal inferoseptal and mid inferoseptal region. post stress EF 53%. Wall motion abnormalities. Abnormal myocardial  perfusion study.    PAST SURGICAL HISTORY: Past Surgical History  Procedure Laterality Date  . Facial cellulitis  02/19/09  . Cardiac catheterization  04/01/09    which showed low normal EF at 50% with question of underlying borderline area of minimal inferoapical hypercontractility. He had mild coronary obstructive disease with coronary calcification and segmental 20% narrowing in the LAD proximally, 20% in the mid LAD with muscle bridging. There was calcification at the ostium of the RCA, and 20 to 30%narrowing of the proximal to mid RCA with 30% distal n  . Cholecystectomy      FAMILY HISTORY: Family History  Problem Relation Age of Onset  . Heart attack Father   . Cancer - Lung Sister   . Thyroid disease Child     SOCIAL HISTORY: History   Social History  . Marital Status: Married    Spouse Name: [name]  . Number of Children: 5  . Years of Education: College   Occupational History  . Not on  file.   Social History Main Topics  . Smoking status: Never Smoker   . Smokeless tobacco: Never Used  . Alcohol Use: No  . Drug Use: No  . Sexual Activity: Not on file   Other Topics Concern  . Not on file   Social History Narrative   Patient is married [name]) and lives at home with wife and two children.   Patient has five adult children.   Patient is retired.   Patient has a college education.   Patient is right-handed.   Patient drinks two cups of coffee daily, 1/2 can of soda daily and tea- 2-3 times per week.      PHYSICAL EXAM  Filed Vitals:   07/30/14 0821  BP: 130/72  Pulse: 98  Height: 5\' 9"  (1.753 m)  Weight: 203 lb (92.08 kg)   Body mass index is 29.96 kg/(m^2).  Generalized: Well developed, in no acute distress   Neurological examination  Mentation: Alert oriented to time, place, history taking. Follows all commands speech and language fluent. MMSE 29/30 Cranial nerve II-XII: Pupils were equal round reactive to light. Extraocular movements were  full, visual field were full on confrontational test. Facial sensation and strength were normal. Uvula tongue midline. Head turning and shoulder shrug  were normal and symmetric. Motor: The motor testing reveals 5 over 5 strength of all 4 extremities. Good symmetric motor tone is noted throughout.  Sensory: Sensory testing is intact to soft touch on all 4 extremities. No evidence of extinction is noted.  Coordination: Cerebellar testing reveals good finger-nose-finger and heel-to-shin bilaterally.  Gait and station: Gait slow and cautious. Tandem gait not attempted. Romberg is negative. No drift is seen.  Reflexes: Deep tendon reflexes are symmetric and normal bilaterally.    DIAGNOSTIC DATA (LABS, IMAGING, TESTING) - I reviewed patient records, labs, notes, testing and imaging myself where available.   ASSESSMENT AND PLAN 81 y.o. year old male  has a past medical history of Thyroid disorder; Depression; Anxiety; Diabetes; High cholesterol; Dementia; Hypertension; Chronic pain; Heart disease; Headache(784.0); Seizures; Stroke; TIA (transient ischemic attack); echocardiogram (05/2010); and History of stress test (05/2010). here with:  1. Seizures 2. History of stroke 3. Memory loss  Overall the patient is doing well. He is not had any seizure events since the last visit. He will continue taking Keppra XL. The patient's memory has also remained stable. His MMSE today is 29/30. He will continue taking Namenda. She denies any strokelike symptoms. His primary care provider and cardiologist is managing his blood pressure and cholesterol. Patient advised that if his symptoms worsen or he develops new symptoms he should let us know. Otherwise he will follow-up in 6 months or sooner if needed.   Ward Givens, MSN, NP-C 07/30/2014, 8:34 AM [facility] [address] [place], [place] [address] [phone]  Note: This document was prepared with digital dictation and  possible smart phrase technology. Any transcriptional errors that result from this process are unintentional.

## 2014-07-30 NOTE — Patient Instructions (Signed)
Continue Keppra XL for seizures Continue Namenda for memory issues.  If your symptoms worsen let us know.

## 2014-07-30 NOTE — Progress Notes (Signed)
I agree with the assessment and plan as directed by NP .The patient is known to me .   Deanda Ruddell, MD  

## 2014-08-05 ENCOUNTER — Other Ambulatory Visit: Payer: Self-pay

## 2014-08-29 ENCOUNTER — Ambulatory Visit (HOSPITAL_COMMUNITY)
Admission: RE | Admit: 2014-08-29 | Discharge: 2014-08-29 | Disposition: A | Discharge status: Home / Self Care | Payer: Medicare Other | Source: Ambulatory Visit | Attending: Cardiovascular Disease | Admitting: Cardiovascular Disease

## 2014-08-29 DIAGNOSIS — R0602 Shortness of breath: Secondary | ICD-10-CM

## 2014-08-29 DIAGNOSIS — E119 Type 2 diabetes mellitus without complications: Secondary | ICD-10-CM | POA: Insufficient documentation

## 2014-08-29 DIAGNOSIS — Z8249 Family history of ischemic heart disease and other diseases of the circulatory system: Secondary | ICD-10-CM | POA: Insufficient documentation

## 2014-08-29 DIAGNOSIS — I10 Essential (primary) hypertension: Secondary | ICD-10-CM | POA: Insufficient documentation

## 2014-08-29 DIAGNOSIS — E785 Hyperlipidemia, unspecified: Secondary | ICD-10-CM | POA: Diagnosis not present

## 2014-08-29 DIAGNOSIS — I5020 Unspecified systolic (congestive) heart failure: Secondary | ICD-10-CM | POA: Insufficient documentation

## 2014-09-04 DIAGNOSIS — L570 Actinic keratosis: Secondary | ICD-10-CM | POA: Diagnosis not present

## 2014-09-04 DIAGNOSIS — L821 Other seborrheic keratosis: Secondary | ICD-10-CM | POA: Diagnosis not present

## 2014-09-04 DIAGNOSIS — L718 Other rosacea: Secondary | ICD-10-CM | POA: Diagnosis not present

## 2014-09-04 DIAGNOSIS — L218 Other seborrheic dermatitis: Secondary | ICD-10-CM | POA: Diagnosis not present

## 2014-09-04 DIAGNOSIS — D1801 Hemangioma of skin and subcutaneous tissue: Secondary | ICD-10-CM | POA: Diagnosis not present

## 2014-09-16 DIAGNOSIS — H4011X1 Primary open-angle glaucoma, mild stage: Secondary | ICD-10-CM | POA: Diagnosis not present

## 2014-09-28 ENCOUNTER — Other Ambulatory Visit: Payer: Self-pay | Admitting: Neurology

## 2014-10-03 ENCOUNTER — Other Ambulatory Visit: Payer: Self-pay | Admitting: Cardiovascular Disease

## 2014-10-03 NOTE — Telephone Encounter (Signed)
Rx(s) sent to pharmacy electronically.  

## 2014-10-24 ENCOUNTER — Encounter: Payer: Self-pay | Admitting: Cardiovascular Disease

## 2014-10-24 ENCOUNTER — Ambulatory Visit (INDEPENDENT_AMBULATORY_CARE_PROVIDER_SITE_OTHER): Payer: Medicare Other | Admitting: Cardiovascular Disease

## 2014-10-24 VITALS — BP 90/50 | HR 107 | Ht 69.0 in | Wt 201.3 lb

## 2014-10-24 DIAGNOSIS — Z79899 Other long term (current) drug therapy: Secondary | ICD-10-CM

## 2014-10-24 DIAGNOSIS — I25810 Atherosclerosis of coronary artery bypass graft(s) without angina pectoris: Secondary | ICD-10-CM | POA: Diagnosis not present

## 2014-10-24 DIAGNOSIS — R0602 Shortness of breath: Secondary | ICD-10-CM

## 2014-10-24 DIAGNOSIS — I9589 Other hypotension: Secondary | ICD-10-CM | POA: Diagnosis not present

## 2014-10-24 DIAGNOSIS — E785 Hyperlipidemia, unspecified: Secondary | ICD-10-CM

## 2014-10-24 DIAGNOSIS — R0609 Other forms of dyspnea: Secondary | ICD-10-CM

## 2014-10-24 DIAGNOSIS — I2510 Atherosclerotic heart disease of native coronary artery without angina pectoris: Secondary | ICD-10-CM

## 2014-10-24 DIAGNOSIS — R0600 Dyspnea, unspecified: Secondary | ICD-10-CM

## 2014-10-24 DIAGNOSIS — R5383 Other fatigue: Secondary | ICD-10-CM

## 2014-10-24 DIAGNOSIS — R42 Dizziness and giddiness: Secondary | ICD-10-CM

## 2014-10-24 NOTE — Patient Instructions (Signed)
Your physician recommends that you return for lab work in: fasting.  Your physician wants you to follow-up in: 6 months or sooner if needed. You will receive a reminder letter in the mail two months in advance. If you don't receive a letter, please call our office to schedule the follow-up appointment.  Compression Stockings Compression stockings are elastic stockings that "compress" your legs. This helps to increase blood flow, decrease swelling, and reduces the chance of getting blood clots in your lower legs. Compression stockings are used:  After surgery.  If you have a history of poor circulation.  If you are prone to blood clots.  If you have varicose veins.  If you sit or are bedridden for long periods of time. WEARING COMPRESSION STOCKINGS  Your compression stockings should be worn as instructed by your caregiver.  Wearing the correct stocking size is important. Your caregiver can help measure and fit you to the correct size.  When wearing your stockings, do not allow the stockings to bunch up. This is especially important around your toes or behind your knees. Keep the stockings as smooth as possible.  Do not roll the stockings downward and leave them rolled down. This can form a restrictive band around your legs and can decrease blood flow.  The stockings should be removed once a day for 1 hour or as instructed by your caregiver. When the stockings are taken off, inspect your legs and feet. Look for:  Open sores.  Red spots.  Puffy areas (swelling).  Anything that does not seem normal. IMPORTANT INFORMATION ABOUT COMPRESSION STOCKINGS  The compression stockings should be clean, dry, and in good condition before you put them on.  Do not put lotion on your legs or feet. This makes it harder to put the stockings on.  Change your stockings immediately if they become wet or soiled.  Do not wear stockings that are ripped or torn.  You may hand-wash or put your  stockings in the washing machine. Use cold or warm water with mild detergent. Do not bleach your stockings. They may be air-dried or dried in the dryer on low heat.  If you have pain or have a feeling of "pins and needles" in your feet or legs, you may be wearing stockings that are too tight. Call your caregiver right away. SEEK IMMEDIATE MEDICAL CARE IF:   You have numbness or tingling in your lower legs that does not get better quickly after the stockings are removed.  Your toes or feet become cold and blue.  You develop open sores or have red spots on your legs that do not go away. MAKE SURE YOU:   Understand these instructions.  Will watch your condition.  Will get help right away if you are not doing well or get worse. Document Released: 11/22/2008 Document Revised: 04/19/2011 Document Reviewed: 11/22/2008 [facility] Patient Information 2015 [place], [place]. This information is not intended to replace advice given to you by your health care provider. Make sure you discuss any questions you have with your health care provider.

## 2014-10-25 DIAGNOSIS — I9589 Other hypotension: Secondary | ICD-10-CM | POA: Diagnosis not present

## 2014-10-25 DIAGNOSIS — Z79899 Other long term (current) drug therapy: Secondary | ICD-10-CM | POA: Diagnosis not present

## 2014-10-25 DIAGNOSIS — R5383 Other fatigue: Secondary | ICD-10-CM | POA: Diagnosis not present

## 2014-10-25 DIAGNOSIS — R0609 Other forms of dyspnea: Secondary | ICD-10-CM | POA: Diagnosis not present

## 2014-10-25 DIAGNOSIS — I25810 Atherosclerosis of coronary artery bypass graft(s) without angina pectoris: Secondary | ICD-10-CM | POA: Diagnosis not present

## 2014-10-25 LAB — TSH: TSH: 0.944 u[IU]/mL (ref 0.350–4.500)

## 2014-10-25 LAB — LIPID PANEL
Cholesterol: 127 mg/dL (ref 125–200)
HDL: 42 mg/dL (ref >=40)
LDL Cholesterol: 62 mg/dL (ref <130)
Total CHOL/HDL Ratio: 3 Ratio (ref <=5.0)
Triglycerides: 113 mg/dL (ref <150)
VLDL: 23 mg/dL (ref <30)

## 2014-10-25 LAB — COMPREHENSIVE METABOLIC PANEL WITH GFR
ALT: 11 U/L (ref 9–46)
AST: 18 U/L (ref 10–35)
Albumin: 3.7 g/dL (ref 3.6–5.1)
Alkaline Phosphatase: 49 U/L (ref 40–115)
BUN: 19 mg/dL (ref 7–25)
CO2: 28 mmol/L (ref 20–31)
Calcium: 8.6 mg/dL (ref 8.6–10.3)
Chloride: 103 mmol/L (ref 98–110)
Creat: 0.88 mg/dL (ref 0.70–1.11)
Glucose, Bld: 83 mg/dL (ref 65–99)
Potassium: 4.5 mmol/L (ref 3.5–5.3)
Sodium: 139 mmol/L (ref 135–146)
Total Bilirubin: 0.8 mg/dL (ref 0.2–1.2)
Total Protein: 6.1 g/dL (ref 6.1–8.1)

## 2014-10-25 LAB — CBC
HCT: 39.3 % (ref 39.0–52.0)
Hemoglobin: 13 g/dL (ref 13.0–17.0)
MCH: 32.3 pg (ref 26.0–34.0)
MCHC: 33.1 g/dL (ref 30.0–36.0)
MCV: 97.8 fL (ref 78.0–100.0)
MPV: 9.6 fL (ref 8.6–12.4)
Platelets: 314 K/uL (ref 150–400)
RBC: 4.02 MIL/uL — ABNORMAL LOW (ref 4.22–5.81)
RDW: 13.4 % (ref 11.5–15.5)
WBC: 7 K/uL (ref 4.0–10.5)

## 2014-10-25 LAB — T3, FREE: T3, Free: 3.3 pg/mL (ref 2.3–4.2)

## 2014-10-25 LAB — T4, FREE: Free T4: 1.05 ng/dL (ref 0.80–1.80)

## 2014-10-26 ENCOUNTER — Encounter: Payer: Self-pay | Admitting: Cardiovascular Disease

## 2014-10-26 DIAGNOSIS — R0602 Shortness of breath: Secondary | ICD-10-CM | POA: Insufficient documentation

## 2014-10-26 LAB — BRAIN NATRIURETIC PEPTIDE: Brain Natriuretic Peptide: 14.8 pg/mL (ref 0.0–100.0)

## 2014-10-26 NOTE — Progress Notes (Signed)
Patient ID: Thomas Long, male   DOB: 1931-09-16, 81 y.o.   MRN: [id number]    HPI: Thomas Long is a 81 y.o. male who presents to the office today for a 4 month followup cardiology evaluation.    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  In 2011 cardiac catheterization revealed ejection fraction at 50% with mild coronary artery disease coronary calcification with segmental 20% narrowing in the proximal LAD and mid LAD with mild muscle bridging, calcification at the ostium of the right coronary artery with 20-30% narrowing in the proximal to mid RCA and 30% distal narrowing. He has a history of a TIA with possible seizure and has been on Aggrenox and Keppra and is currently followed by neurology. An echo Doppler study in April 2012 showed an ejection fraction of 50% with inferior hypokinesis, mild mitral annular calcification and mild TR, mild aortic sclerosis without stenosis. I had seen him on 05/30/2012 with complaints of increased fatigability as well as shortness of breath with less activity.  A two-year followup echo Doppler study on 07/06/2012 which showed mild LVH. Ejection fraction was 50-55% and again there was mild hypokinesis of the basal mid inferior myocardium and  grade 1 diastolic dysfunction. He had minimal pulmonary hypertension with PA pressure 32 mm, mitral annular calcification with trivial MR. His aortic valve was mildly sclerotic without stenosis.  When I saw on 08/01/2012 he had orthostatic symptoms and was hypotensive. At that time, I  weaned slowly and ultimately discontinued his Bystolic. He felt improved off this therapy. He denies any awareness of tachycardia palpitations or pulse irregularity.  Since I saw him in March 2015, he has been without exertional chest pain.  He does admit to some aching nonexertional chest pain, particularly when he lies down, which lasts seconds to minutes. He does note some shortness of breath particularly with activity.   He denies any recent seizure activity.  He has had some difficulty with early memory loss and has been started on Namenda 10 mg twice a day by neurology.  He continues to take atorvastatin 10 mg for hyperlipidemia.  He is unaware of palpitations.   He underwent a follow-up echo Doppler study on 08/29/2014.  This showed normal LV cavity size, but his LV function has slightly declined and was now 40-45% without segmental wall motion abnormality.  There also was felt to be mild the reduced RV systolic function.  He has had issues with low blood pressure.  He also recently saw Karrie Doffing, PA in Dr. Stefani Dama office.  He has not had any seizure activity but continues to be on Keppra.  He does have some short-term memory issues, which seem to be exacerbated by being overtired.  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  He no longer does visitation at the hospital due to his inability to walk distances without developing  shortness of breath.  He denies any chest pain.  He denies any awareness of arrhythmia.  He denies any bleeding.  He presents for evaluation.  Past Medical History  Diagnosis Date  . Thyroid disorder   . Depression   . Anxiety   . Diabetes   . High cholesterol   . Dementia   . Hypertension   . Chronic pain   . Heart disease   . Headache(784.0)     migraines  . Seizures   . Stroke   . TIA (transient ischemic attack)   .  Hx of echocardiogram 05/2010    showed EF 50% with inferior hypokinesis. He did have mild mitral annular calcification, mild TR, mild aortic sclerosis without stenosis.  Marland Kitchen History of stress test 05/2010    A moderate perfusion defect is seen in the apical septal, Basal inferoseptal and mid inferoseptal region. post stress EF 53%. Wall motion abnormalities. Abnormal myocardial perfusion study.    Past Surgical History  Procedure Laterality Date  . Facial cellulitis  02/19/09  . Cardiac catheterization  04/01/09      which showed low normal EF at 50% with question of underlying borderline area of minimal inferoapical hypercontractility. He had mild coronary obstructive disease with coronary calcification and segmental 20% narrowing in the LAD proximally, 20% in the mid LAD with muscle bridging. There was calcification at the ostium of the RCA, and 20 to 30%narrowing of the proximal to mid RCA with 30% distal n  . Cholecystectomy      No Known Allergies  Current Outpatient Prescriptions  Medication Sig Dispense Refill  . AGGRENOX 25-200 MG per 12 hr capsule TAKE 1 CAPSULE TWICE DAILY 180 capsule 3  . atorvastatin (LIPITOR) 10 MG tablet Take 1 tablet (10 mg total) by mouth daily. 90 tablet 2  . ketoconazole (NIZORAL) 2 % shampoo AS NEEDED  5  . latanoprost (XALATAN) 0.005 % ophthalmic solution   3  . levETIRAcetam (KEPPRA XR) 500 MG 24 hr tablet TAKE 1 TABLET BY MOUTH EVERY DAY 90 tablet 1  . loratadine (CLARITIN) 10 MG tablet Take 10 mg by mouth daily.    . memantine (NAMENDA) 10 MG tablet TAKE 1 TABLET TWICE A DAY 180 tablet 0  . Multiple Vitamins-Minerals (CENTRUM SILVER ADULT 50+ PO) Take 1 capsule by mouth daily.     No current facility-administered medications for this visit.    Socially he is married has 5 children 6 grandchildren. He typically goes to bed approximately 8:30 at night and wakes up at approximately 4 AM in the morning. In the early morning he spends time in prayer and then typically attends 7 AM Mass. Since I last saw him he is retired from his [employer].  ROS General: Negative; No fevers, chills, or night sweats;  HEENT: Negative; No changes in vision or hearing, sinus congestion, difficulty swallowing Pulmonary: Negative; No cough, wheezing, shortness of breath, hemoptysis Cardiovascular: See history of present illness GI: Negative; No nausea, vomiting, diarrhea, or abdominal pain GU: Negative; No dysuria, hematuria, or difficulty voiding Musculoskeletal: Negative; no  myalgias, joint pain, or weakness Hematologic/Oncology: Negative; no easy bruising, bleeding Endocrine: Negative; no heat/cold intolerance; no diabetes Neuro: No recent seizures. Skin: Negative; No rashes or skin lesions Psychiatric: Negative; No behavioral problems, depression Sleep: Negative; No snoring, daytime sleepiness, hypersomnolence, bruxism, restless legs, hypnogognic hallucinations, no cataplexy Other comprehensive 14 point system review is negative.   PE BP 90/50 mmHg  Pulse 107  Ht 5' 9" (1.753 m)  Wt 201 lb 4.8 oz (91.309 kg)  BMI 29.71 kg/m2    Repeat blood pressure by me was 100/60 supine and 90/56 standing.  Wt Readings from Last 3 Encounters:  10/24/14 201 lb 4.8 oz (91.309 kg)  07/30/14 203 lb (92.08 kg)  07/09/14 204 lb 12.8 oz (92.897 kg)   General: Alert, oriented, no distress.  Skin: normal turgor, no rashes HEENT: Normocephalic, atraumatic. Pupils round and reactive; sclera anicteric;no lid lag.  Nose without nasal septal hypertrophy no xanthelasmas Mouth/Parynx benign; Mallinpatti scale 2 Neck: No JVD, no carotid bruits with  normal carotid upstroke. Chest wall: Nontender to palpation Lungs: clear to ausculatation and percussion; no wheezing or rales Heart: RRR, s1 s2 normal  1/6 systolic murmur, unchanged. No diastolic murmur. No rubs thrills or heaves Abdomen: soft, nontender; no hepatosplenomehaly, BS+; abdominal aorta nontender and not dilated by palpation. mild central adiposity.  Back: No CVA tenderness Pulses 2+ Extremities: no clubbing cyanosis or edema, Homan's sign negative  Neurologic: grossly nonfocal Psychological: Normal affect and mood  ECG (independently read by me): Sinus tachycardia at 10 7 bpm.  First degree AV block with a PR interval at 224 ms.  Mild RV conduction delay.  No ectopy  May 2016 ECG (independently read by me): Sinus rhythm at 98 bpm.  Occasional PVC.  No ST segment changes.  QTc interval 472 ms.  First-degree AV block  with a PR interval at 230 ms.  March 2015 ECG (independently read by me): Sinus tachycardia 103 beats per minute with one PAC. First degree AV block. RV conduction delay.  Prior 11/02/2012 ECG: Normal sinus rhythm at 94beats per minute with a rare PAC. Borderline first-degree block; PR 210 ms. nonspecific T changes.  LABS: BMP Latest Ref Rng 04/25/2013  Glucose 70 - 99 mg/dL 104(H)  BUN 6 - 23 mg/dL 18  Creatinine 0.50 - 1.35 mg/dL 0.84  Sodium 135 - 145 mEq/L 138  Potassium 3.5 - 5.3 mEq/L 4.5  Chloride 96 - 112 mEq/L 102  CO2 19 - 32 mEq/L 31  Calcium 8.4 - 10.5 mg/dL 9.2   Hepatic Function Latest Ref Rng 04/25/2013  Total Protein 6.0 - 8.3 g/dL 6.8  Albumin 3.5 - 5.2 g/dL 4.1  AST 0 - 37 U/L 20  ALT 0 - 53 U/L 13  Alk Phosphatase 39 - 117 U/L 51  Total Bilirubin 0.2 - 1.2 mg/dL 0.9   CBC Latest Ref Rng 04/25/2013  WBC 4.0 - 10.5 K/uL 7.2  Hemoglobin 13.0 - 17.0 g/dL 13.6  Hematocrit 39.0 - 52.0 % 38.8(L)  Platelets 150 - 400 K/uL 354   Lab Results  Component Value Date   MCV 92.4 04/25/2013   Lab Results  Component Value Date   TSH 0.994 04/25/2013  No results found for: HGBA1C  Lipid Panel     Component Value Date/Time   CHOL 128 04/25/2013 0803   TRIG 110 04/25/2013 0803   HDL 45 04/25/2013 0803   CHOLHDL 2.8 04/25/2013 0803   VLDL 22 04/25/2013 0803   LDLCALC 61 04/25/2013 0803   RADIOLOGY: No results found.    ASSESSMENT AND PLAN: Thomas Long is an 81 year old white male has a history of documented mild CAD and previously was on beta blocker therapy. He no longer is on beta blocker therapy due to  previous orthostatic hypotension and marked fatigability. He is not having anginal symptomatology.  He denies any classic exertional chest pain.   He's not had any seizure activity recently and is controlled on his Keppra.  He continues to take atorvastatin 10 mg daily for hyperlipidemia.  He has been on Aggrenox following his remote TIA.  He continues to experience  some shortness of breath with activity.  I reviewed his most recent echo Doppler with him in detail.  His echo Doppler study in 2014 showed an ejection fraction of 50-55%.  His most recent echo study now shows an EF of 40-45% without focal segmental wall motion abnormality.  His blood pressure continues to be low which makes it difficult to initiate any ACE inhibitor.  She and  her ARB therapy.  He does have sinus tachycardia but his blood pressure precludes beta blocker treatment.  I have suggested initially the use of Jobst compression stockings with 20-30 mm pressure support.  Hopefully this will reduce some of his orthostasis.  Laboratory will be checked in the fasting state consisting of a BNP, TSH, T3, T3, free T4, cemented, CBC.  I will see him in 6 months for cardiology reevaluation or sooner if problems arise.   [name], MD, [facility]  10/26/2014 10:54 AM

## 2014-11-05 ENCOUNTER — Telehealth: Payer: Self-pay | Admitting: Cardiovascular Disease

## 2014-11-05 NOTE — Telephone Encounter (Signed)
Please call,wants to give you an update on him wearing the compression stockings. She needs to tell you about his blood pressure dropping,she needs your advice.

## 2014-11-05 NOTE — Telephone Encounter (Signed)
Called back patient.  Wife reports his HR has been down.  He started wearing compression stockings - only wore over weekend (Sat & Sun) because both patient and wife had too much difficulty trying to get them on. Daughter of patient had to be present to assist.  She is concerned because she noted when she checked his BP & HR over weekend, BP was unchanged, but HR was low.  Sat: 106/59 w HR 55 Sun: 93/48 w HR 53 Mon: HR 62 Tue: 101/42 HR 63  She notes his pulse is usually closer to 100. Thinks the drop is assoc w/ stocking use.  Asked about symptoms - his only symptom was Saturday wherein he felt a little woozy after putting stockings on and moving around.   Informed wife I would defer to Dr. Claiborne Billings for advice on this - she is agreeable to this. Asked her to call if new problems.

## 2014-11-07 ENCOUNTER — Inpatient Hospital Stay (HOSPITAL_COMMUNITY): Payer: Medicare Other

## 2014-11-07 ENCOUNTER — Encounter (HOSPITAL_COMMUNITY)
Admission: EM | Disposition: A | Discharge status: Home / Self Care | Payer: Self-pay | Source: Home / Self Care | Attending: Cardiovascular Disease

## 2014-11-07 ENCOUNTER — Emergency Department (HOSPITAL_COMMUNITY): Payer: Medicare Other

## 2014-11-07 ENCOUNTER — Inpatient Hospital Stay (HOSPITAL_COMMUNITY)
Admission: EM | Admit: 2014-11-07 | Discharge: 2014-11-11 | DRG: 243 | Disposition: A | Discharge status: Home / Self Care | Payer: Medicare Other | Attending: Cardiovascular Disease | Admitting: Cardiovascular Disease

## 2014-11-07 ENCOUNTER — Encounter (HOSPITAL_COMMUNITY): Payer: Self-pay

## 2014-11-07 ENCOUNTER — Ambulatory Visit (HOSPITAL_COMMUNITY): Admit: 2014-11-07 | Payer: Self-pay | Admitting: Cardiovascular Disease

## 2014-11-07 DIAGNOSIS — Z8673 Personal history of transient ischemic attack (TIA), and cerebral infarction without residual deficits: Secondary | ICD-10-CM | POA: Diagnosis not present

## 2014-11-07 DIAGNOSIS — Y838 Other surgical procedures as the cause of abnormal reaction of the patient, or of later complication, without mention of misadventure at the time of the procedure: Secondary | ICD-10-CM | POA: Diagnosis not present

## 2014-11-07 DIAGNOSIS — E785 Hyperlipidemia, unspecified: Secondary | ICD-10-CM | POA: Diagnosis not present

## 2014-11-07 DIAGNOSIS — E039 Hypothyroidism, unspecified: Secondary | ICD-10-CM | POA: Diagnosis present

## 2014-11-07 DIAGNOSIS — Z959 Presence of cardiac and vascular implant and graft, unspecified: Secondary | ICD-10-CM

## 2014-11-07 DIAGNOSIS — I110 Hypertensive heart disease with heart failure: Secondary | ICD-10-CM | POA: Diagnosis present

## 2014-11-07 DIAGNOSIS — F0390 Unspecified dementia without behavioral disturbance: Secondary | ICD-10-CM | POA: Diagnosis present

## 2014-11-07 DIAGNOSIS — Z8249 Family history of ischemic heart disease and other diseases of the circulatory system: Secondary | ICD-10-CM

## 2014-11-07 DIAGNOSIS — Z7902 Long term (current) use of antithrombotics/antiplatelets: Secondary | ICD-10-CM

## 2014-11-07 DIAGNOSIS — I5022 Chronic systolic (congestive) heart failure: Secondary | ICD-10-CM | POA: Diagnosis not present

## 2014-11-07 DIAGNOSIS — Z79899 Other long term (current) drug therapy: Secondary | ICD-10-CM | POA: Diagnosis not present

## 2014-11-07 DIAGNOSIS — I2510 Atherosclerotic heart disease of native coronary artery without angina pectoris: Secondary | ICD-10-CM | POA: Diagnosis not present

## 2014-11-07 DIAGNOSIS — R0789 Other chest pain: Secondary | ICD-10-CM | POA: Diagnosis not present

## 2014-11-07 DIAGNOSIS — I209 Angina pectoris, unspecified: Secondary | ICD-10-CM

## 2014-11-07 DIAGNOSIS — Y92230 Patient room in hospital as the place of occurrence of the external cause: Secondary | ICD-10-CM

## 2014-11-07 DIAGNOSIS — G459 Transient cerebral ischemic attack, unspecified: Secondary | ICD-10-CM | POA: Diagnosis present

## 2014-11-07 DIAGNOSIS — I428 Other cardiomyopathies: Secondary | ICD-10-CM | POA: Diagnosis not present

## 2014-11-07 DIAGNOSIS — R001 Bradycardia, unspecified: Secondary | ICD-10-CM | POA: Diagnosis not present

## 2014-11-07 DIAGNOSIS — I519 Heart disease, unspecified: Secondary | ICD-10-CM | POA: Diagnosis not present

## 2014-11-07 DIAGNOSIS — E7800 Pure hypercholesterolemia, unspecified: Secondary | ICD-10-CM | POA: Diagnosis present

## 2014-11-07 DIAGNOSIS — I442 Atrioventricular block, complete: Principal | ICD-10-CM

## 2014-11-07 DIAGNOSIS — I951 Orthostatic hypotension: Secondary | ICD-10-CM | POA: Diagnosis present

## 2014-11-07 DIAGNOSIS — T82120A Displacement of cardiac electrode, initial encounter: Secondary | ICD-10-CM | POA: Diagnosis not present

## 2014-11-07 DIAGNOSIS — R0602 Shortness of breath: Secondary | ICD-10-CM | POA: Diagnosis not present

## 2014-11-07 DIAGNOSIS — Z45018 Encounter for adjustment and management of other part of cardiac pacemaker: Secondary | ICD-10-CM | POA: Diagnosis not present

## 2014-11-07 DIAGNOSIS — E119 Type 2 diabetes mellitus without complications: Secondary | ICD-10-CM | POA: Diagnosis present

## 2014-11-07 DIAGNOSIS — I429 Cardiomyopathy, unspecified: Secondary | ICD-10-CM | POA: Diagnosis not present

## 2014-11-07 DIAGNOSIS — R05 Cough: Secondary | ICD-10-CM | POA: Diagnosis not present

## 2014-11-07 DIAGNOSIS — R072 Precordial pain: Secondary | ICD-10-CM | POA: Diagnosis not present

## 2014-11-07 DIAGNOSIS — I4430 Unspecified atrioventricular block: Secondary | ICD-10-CM | POA: Diagnosis not present

## 2014-11-07 HISTORY — DX: Rheumatoid arthritis, unspecified: M06.9

## 2014-11-07 HISTORY — DX: Cellulitis of face: L03.211

## 2014-11-07 HISTORY — DX: Sleep apnea, unspecified: G47.30

## 2014-11-07 HISTORY — PX: CARDIAC CATHETERIZATION: SHX172

## 2014-11-07 HISTORY — DX: Atrioventricular block, complete: I44.2

## 2014-11-07 HISTORY — DX: Presence of cardiac pacemaker: Z95.0

## 2014-11-07 LAB — POCT I-STAT, CHEM 8
BUN: 33 mg/dL — ABNORMAL HIGH (ref 6–20)
Calcium, Ion: 1.09 mmol/L — ABNORMAL LOW (ref 1.13–1.30)
Chloride: 104 mmol/L (ref 101–111)
Creatinine, Ser: 1.4 mg/dL — ABNORMAL HIGH (ref 0.61–1.24)
Glucose, Bld: 114 mg/dL — ABNORMAL HIGH (ref 65–99)
HCT: 37 % — ABNORMAL LOW (ref 39.0–52.0)
Hemoglobin: 12.6 g/dL — ABNORMAL LOW (ref 13.0–17.0)
Potassium: 4.2 mmol/L (ref 3.5–5.1)
Sodium: 138 mmol/L (ref 135–145)
TCO2: 23 mmol/L (ref 0–100)

## 2014-11-07 LAB — CBC WITH DIFFERENTIAL/PLATELET
Basophils Absolute: 0 10*3/uL (ref 0.0–0.1)
Basophils Relative: 0 %
Eosinophils Absolute: 0.2 10*3/uL (ref 0.0–0.7)
Eosinophils Relative: 2 %
HCT: 35.9 % — ABNORMAL LOW (ref 39.0–52.0)
Hemoglobin: 12.1 g/dL — ABNORMAL LOW (ref 13.0–17.0)
Lymphocytes Relative: 14 %
Lymphs Abs: 1.3 10*3/uL (ref 0.7–4.0)
MCH: 33 pg (ref 26.0–34.0)
MCHC: 33.7 g/dL (ref 30.0–36.0)
MCV: 97.8 fL (ref 78.0–100.0)
Monocytes Absolute: 1.2 10*3/uL — ABNORMAL HIGH (ref 0.1–1.0)
Monocytes Relative: 13 %
Neutro Abs: 6.6 10*3/uL (ref 1.7–7.7)
Neutrophils Relative %: 71 %
Platelets: 266 10*3/uL (ref 150–400)
RBC: 3.67 MIL/uL — ABNORMAL LOW (ref 4.22–5.81)
RDW: 13.1 % (ref 11.5–15.5)
WBC: 9.3 10*3/uL (ref 4.0–10.5)

## 2014-11-07 LAB — BASIC METABOLIC PANEL
Anion gap: 8 (ref 5–15)
BUN: 31 mg/dL — ABNORMAL HIGH (ref 6–20)
CO2: 24 mmol/L (ref 22–32)
Calcium: 8.3 mg/dL — ABNORMAL LOW (ref 8.9–10.3)
Chloride: 103 mmol/L (ref 101–111)
Creatinine, Ser: 1.4 mg/dL — ABNORMAL HIGH (ref 0.61–1.24)
GFR calc Af Amer: 53 mL/min — ABNORMAL LOW (ref >60)
GFR calc non Af Amer: 46 mL/min — ABNORMAL LOW (ref >60)
Glucose, Bld: 118 mg/dL — ABNORMAL HIGH (ref 65–99)
Potassium: 4.3 mmol/L (ref 3.5–5.1)
Sodium: 135 mmol/L (ref 135–145)

## 2014-11-07 LAB — MAGNESIUM: Magnesium: 2.2 mg/dL (ref 1.7–2.4)

## 2014-11-07 LAB — POCT I-STAT TROPONIN I: Troponin i, poc: 0.04 ng/mL (ref 0.00–0.08)

## 2014-11-07 LAB — MRSA PCR SCREENING: MRSA by PCR: NEGATIVE

## 2014-11-07 SURGERY — LEFT HEART CATH AND CORONARY ANGIOGRAPHY

## 2014-11-07 MED ORDER — SODIUM CHLORIDE 0.9 % IV BOLUS (SEPSIS)
500.0000 mL | Freq: Once | INTRAVENOUS | Status: AC
  Administered 2014-11-07: 500 mL via INTRAVENOUS

## 2014-11-07 MED ORDER — LIDOCAINE HCL (PF) 1 % IJ SOLN
INTRAMUSCULAR | Status: DC | PRN
  Administered 2014-11-07: 15 mL

## 2014-11-07 MED ORDER — ALPRAZOLAM 0.25 MG PO TABS
0.2500 mg | ORAL_TABLET | Freq: Once | ORAL | Status: AC
  Administered 2014-11-07: 0.25 mg via ORAL
  Filled 2014-11-07: qty 1

## 2014-11-07 MED ORDER — SODIUM CHLORIDE 0.9 % IV SOLN: INTRAVENOUS | Status: DC

## 2014-11-07 MED ORDER — NITROGLYCERIN 1 MG/10 ML FOR IR/CATH LAB
INTRA_ARTERIAL | Status: DC | PRN
  Administered 2014-11-07: 18:00:00

## 2014-11-07 MED ORDER — LORATADINE 10 MG PO TABS
10.0000 mg | ORAL_TABLET | Freq: Every day | ORAL | Status: DC
  Administered 2014-11-09 – 2014-11-11 (×3): 10 mg via ORAL
  Filled 2014-11-07 (×3): qty 1

## 2014-11-07 MED ORDER — FENTANYL CITRATE (PF) 100 MCG/2ML IJ SOLN
INTRAMUSCULAR | Status: AC
  Filled 2014-11-07: qty 4

## 2014-11-07 MED ORDER — ONDANSETRON HCL 4 MG/2ML IJ SOLN: 4.0000 mg | Freq: Four times a day (QID) | INTRAMUSCULAR | Status: DC | PRN

## 2014-11-07 MED ORDER — SODIUM CHLORIDE 0.9 % IJ SOLN: 3.0000 mL | INTRAMUSCULAR | Status: DC | PRN

## 2014-11-07 MED ORDER — FENTANYL CITRATE (PF) 100 MCG/2ML IJ SOLN
INTRAMUSCULAR | Status: DC | PRN
  Administered 2014-11-07: 25 ug via INTRAVENOUS

## 2014-11-07 MED ORDER — NITROGLYCERIN 1 MG/10 ML FOR IR/CATH LAB
INTRA_ARTERIAL | Status: AC
  Filled 2014-11-07: qty 10

## 2014-11-07 MED ORDER — MEMANTINE HCL 5 MG PO TABS
10.0000 mg | ORAL_TABLET | Freq: Two times a day (BID) | ORAL | Status: DC
  Administered 2014-11-07 – 2014-11-11 (×7): 10 mg via ORAL
  Filled 2014-11-07: qty 1
  Filled 2014-11-07 (×2): qty 2
  Filled 2014-11-07 (×2): qty 1
  Filled 2014-11-07: qty 2
  Filled 2014-11-07: qty 1
  Filled 2014-11-07: qty 2
  Filled 2014-11-07 (×2): qty 1

## 2014-11-07 MED ORDER — SODIUM CHLORIDE 0.9 % IV SOLN
INTRAVENOUS | Status: DC
  Administered 2014-11-07: 10 mL/h via INTRAVENOUS

## 2014-11-07 MED ORDER — NITROGLYCERIN 0.4 MG SL SUBL: 0.4000 mg | SUBLINGUAL_TABLET | SUBLINGUAL | Status: DC | PRN

## 2014-11-07 MED ORDER — IOHEXOL 350 MG/ML SOLN
INTRAVENOUS | Status: DC | PRN
  Administered 2014-11-07: 70 mL via INTRACARDIAC

## 2014-11-07 MED ORDER — NITROGLYCERIN 1 MG/10 ML FOR IR/CATH LAB: INTRA_ARTERIAL | Status: DC | PRN

## 2014-11-07 MED ORDER — SODIUM CHLORIDE 0.9 % IV SOLN: INTRAVENOUS | Status: AC

## 2014-11-07 MED ORDER — ACETAMINOPHEN 325 MG PO TABS: 650.0000 mg | ORAL_TABLET | ORAL | Status: DC | PRN

## 2014-11-07 MED ORDER — LIDOCAINE HCL (PF) 1 % IJ SOLN
INTRAMUSCULAR | Status: AC
  Filled 2014-11-07: qty 30

## 2014-11-07 MED ORDER — SODIUM CHLORIDE 0.9 % IJ SOLN
3.0000 mL | Freq: Two times a day (BID) | INTRAMUSCULAR | Status: DC
  Administered 2014-11-07 – 2014-11-08 (×2): 3 mL via INTRAVENOUS

## 2014-11-07 MED ORDER — HEPARIN (PORCINE) IN NACL 2-0.9 UNIT/ML-% IJ SOLN
INTRAMUSCULAR | Status: AC
  Filled 2014-11-07: qty 1000

## 2014-11-07 MED ORDER — SODIUM CHLORIDE 0.9 % IV SOLN: 250.0000 mL | INTRAVENOUS | Status: DC | PRN

## 2014-11-07 MED ORDER — ATROPINE SULFATE 0.1 MG/ML IJ SOLN
0.5000 mg | Freq: Once | INTRAMUSCULAR | Status: AC
  Administered 2014-11-07: 0.5 mg via INTRAVENOUS
  Filled 2014-11-07: qty 10

## 2014-11-07 MED ORDER — MIDAZOLAM HCL 2 MG/2ML IJ SOLN
INTRAMUSCULAR | Status: AC
  Filled 2014-11-07: qty 4

## 2014-11-07 MED ORDER — HEPARIN SODIUM (PORCINE) 5000 UNIT/ML IJ SOLN
5000.0000 [IU] | Freq: Three times a day (TID) | INTRAMUSCULAR | Status: DC
  Administered 2014-11-07 – 2014-11-08 (×2): 5000 [IU] via SUBCUTANEOUS
  Filled 2014-11-07 (×2): qty 1

## 2014-11-07 MED ORDER — LEVETIRACETAM ER 500 MG PO TB24
500.0000 mg | ORAL_TABLET | Freq: Every day | ORAL | Status: DC
  Administered 2014-11-07 – 2014-11-11 (×5): 500 mg via ORAL
  Filled 2014-11-07 (×5): qty 1

## 2014-11-07 MED ORDER — SODIUM CHLORIDE 0.9 % IV SOLN
INTRAVENOUS | Status: DC | PRN
  Administered 2014-11-07: 91 mL/h via INTRAVENOUS

## 2014-11-07 MED ORDER — ATORVASTATIN CALCIUM 10 MG PO TABS
10.0000 mg | ORAL_TABLET | Freq: Every day | ORAL | Status: DC
  Administered 2014-11-09 – 2014-11-11 (×3): 10 mg via ORAL
  Filled 2014-11-07 (×3): qty 1

## 2014-11-07 MED ORDER — ASPIRIN-DIPYRIDAMOLE ER 25-200 MG PO CP12
1.0000 | ORAL_CAPSULE | Freq: Two times a day (BID) | ORAL | Status: DC
  Administered 2014-11-07: 1 via ORAL
  Filled 2014-11-07 (×3): qty 1

## 2014-11-07 MED ORDER — MIDAZOLAM HCL 2 MG/2ML IJ SOLN
INTRAMUSCULAR | Status: DC | PRN
  Administered 2014-11-07: 1 mg via INTRAVENOUS

## 2014-11-07 SURGICAL SUPPLY — 13 items
CATH INFINITI 5FR MULTPACK ANG (CATHETERS) ×2 IMPLANT
CATH S G BIP PACING (SET/KITS/TRAYS/PACK) ×2 IMPLANT
KIT HEART LEFT (KITS) ×3 IMPLANT
NDL 18GX3-1/2 9CM (NEEDLE) IMPLANT
NEEDLE 18GX3-1/2 9CM (NEEDLE) ×3 IMPLANT
PACK CARDIAC CATHETERIZATION (CUSTOM PROCEDURE TRAY) ×3 IMPLANT
SHEATH PINNACLE 5F 10CM (SHEATH) ×2 IMPLANT
SHEATH PINNACLE 6F 10CM (SHEATH) ×2 IMPLANT
SLEEVE REPOSITIONING LENGTH 30 (MISCELLANEOUS) ×2 IMPLANT
SYR MEDRAD MARK V 150ML (SYRINGE) ×3 IMPLANT
TRANSDUCER W/STOPCOCK (MISCELLANEOUS) ×3 IMPLANT
WIRE EMERALD 3MM-J .035X150CM (WIRE) ×2 IMPLANT
WIRE HI TORQ VERSACORE-J 145CM (WIRE) ×2 IMPLANT

## 2014-11-07 NOTE — ED Notes (Addendum)
Pt. Presents from PCP office today. Abnormal ekg possibly showing heart block. Pt. Ambulatory, AxO x4. Pt. Has been feeling "woozy" at home recently.

## 2014-11-07 NOTE — Progress Notes (Signed)
Pt arrived to unit from cath lab, NAD, VSS, Temp pacer sutured into right fem vein. Right groin s/p right fem arterial sheath site CDI no s/s of bleeding/hematoma. Pt AAOx3 with known history per MD note of dementia. Bed alarm is on, pt voices understanding to keep right leg straight due to temp pacing wires. Report given to oncoming 2H RN.

## 2014-11-07 NOTE — Progress Notes (Signed)
Pt pulled off condom cath placed earlier in the shift. After having a foley huddle with [facility] [employer] and notifying Dr. Philbert Riser of pt state all parties agreed a foley was appropriate. Ryan RN helped place the foley. Pt tolerated well. Will continue to monitor.

## 2014-11-07 NOTE — ED Provider Notes (Signed)
CSN: [id number]     Arrival date & time 11/07/14  1558 History   First MD Initiated Contact with Patient 11/07/14 1600     Chief Complaint  Patient presents with  . Abnormal ECG     (Consider location/radiation/quality/duration/timing/severity/associated sxs/prior Treatment) Patient is a 81 y.o. male presenting with dizziness.  Dizziness Quality:  Lightheadedness Severity:  Mild Onset quality:  Gradual Timing:  Intermittent Chronicity:  New Context: physical activity and standing up   Associated symptoms: chest pain, palpitations and shortness of breath     Past Medical History  Diagnosis Date  . Thyroid disorder   . Depression   . Anxiety   . Diabetes   . High cholesterol   . Dementia   . Hypertension   . Chronic pain   . Heart disease   . Headache(784.0)     migraines  . Seizures   . Stroke   . TIA (transient ischemic attack)   . Hx of echocardiogram 05/2010    showed EF 50% with inferior hypokinesis. He did have mild mitral annular calcification, mild TR, mild aortic sclerosis without stenosis.  Marland Kitchen History of stress test 05/2010    A moderate perfusion defect is seen in the apical septal, Basal inferoseptal and mid inferoseptal region. post stress EF 53%. Wall motion abnormalities. Abnormal myocardial perfusion study.   Past Surgical History  Procedure Laterality Date  . Facial cellulitis  02/19/09  . Cardiac catheterization  04/01/09    which showed low normal EF at 50% with question of underlying borderline area of minimal inferoapical hypercontractility. He had mild coronary obstructive disease with coronary calcification and segmental 20% narrowing in the LAD proximally, 20% in the mid LAD with muscle bridging. There was calcification at the ostium of the RCA, and 20 to 30%narrowing of the proximal to mid RCA with 30% distal n  . Cholecystectomy     Family History  Problem Relation Age of Onset  . Heart attack Father   . Cancer - Lung Sister   . Thyroid  disease Child    Social History  Substance Use Topics  . Smoking status: Never Smoker   . Smokeless tobacco: Never Used  . Alcohol Use: No    Review of Systems  Constitutional: Positive for activity change. Negative for fever.  Respiratory: Positive for shortness of breath. Negative for cough and chest tightness.   Cardiovascular: Positive for chest pain and palpitations.  Neurological: Positive for dizziness.  All other systems reviewed and are negative.     Allergies  Review of patient's allergies indicates no known allergies.  Home Medications   Prior to Admission medications   Medication Sig Start Date End Date Taking? Authorizing Provider  AGGRENOX 25-200 MG per 12 hr capsule TAKE 1 CAPSULE TWICE DAILY 01/28/14   Larey Seat, MD  atorvastatin (LIPITOR) 10 MG tablet Take 1 tablet (10 mg total) by mouth daily. 10/03/14   [name], MD  ketoconazole (NIZORAL) 2 % shampoo AS NEEDED 06/12/14   Historical Provider, MD  latanoprost (XALATAN) 0.005 % ophthalmic solution  04/12/14   Historical Provider, MD  levETIRAcetam (KEPPRA XR) 500 MG 24 hr tablet TAKE 1 TABLET BY MOUTH EVERY DAY 09/29/14   Larey Seat, MD  loratadine (CLARITIN) 10 MG tablet Take 10 mg by mouth daily.    Historical Provider, MD  memantine (NAMENDA) 10 MG tablet TAKE 1 TABLET TWICE A DAY 07/10/14   Larey Seat, MD  Multiple Vitamins-Minerals (CENTRUM SILVER ADULT 50+ PO) Take 1  capsule by mouth daily.    Historical Provider, MD   Pulse 37  Temp(Src) 97.9 F (36.6 C) (Oral)  Resp 22  SpO2 96% Physical Exam  Constitutional: He appears well-developed and well-nourished. No distress.  HENT:  Head: Normocephalic.  Eyes: EOM are normal.  Neck: Normal range of motion.  Cardiovascular:  Bradycardic  Pulmonary/Chest: Effort normal. No respiratory distress.  Abdominal: Soft. He exhibits no distension.  Musculoskeletal: Normal range of motion. He exhibits no edema.  Neurological: He is alert. No  cranial nerve deficit.  Skin: Skin is warm. He is not diaphoretic. There is erythema.  Psychiatric: His behavior is normal. Judgment and thought content normal.  Nursing note and vitals reviewed.   ED Course  Procedures (including critical care time) Labs Review Labs Reviewed  BASIC METABOLIC PANEL  CBC WITH DIFFERENTIAL/PLATELET  MAGNESIUM  I-STAT CHEM 8, ED  I-STAT TROPOININ, ED    Imaging Review No results found. I have personally reviewed and evaluated these images and lab results as part of my medical decision-making.   EKG Interpretation None      MDM   Patient presents emergency department today from his primary care physician after being found to have a possible third-degree heart block which is new for him. Patient was complaining of dizziness and chest pain for the past few days. He states this is intermittent and is associated with shortness of breath this more than normal. He is not on any rate control drugs. At PCP, patient with HR of 55.   Patient currently in 3rd degree heartblock. Unresponsive to atropine.  Patient currently denies chest pain but does endorse dizziness. Cardiology called and will admit.   Final diagnoses:  Complete heart block  Exertional shortness of breath  Atypical chest pain      Roberto Scales, MD 11/08/14 4008  Sherwood Gambler, MD 11/10/14 6761

## 2014-11-07 NOTE — H&P (Signed)
Patient ID: Thomas Long MRN: [id number], DOB/AGE: October 02, 1931   Admit date: 11/07/2014   Primary Physician: Mathews Argyle, MD Primary Cardiologist: Dr. Claiborne Billings  Pt. Profile:  pleasant 81 year old male with past medical history of nonobstructive CAD based on cath 2011, history of TIA, diabetes, dementia, hypertension, history of seizure presented with intermittent CP for several days and 3rd degree HB started today  Problem List  Past Medical History  Diagnosis Date  . Thyroid disorder   . Depression   . Anxiety   . Diabetes   . High cholesterol   . Dementia   . Hypertension   . Chronic pain   . Heart disease   . Headache(784.0)     migraines  . Seizures   . Stroke   . TIA (transient ischemic attack)   . Hx of echocardiogram 05/2010    showed EF 50% with inferior hypokinesis. He did have mild mitral annular calcification, mild TR, mild aortic sclerosis without stenosis.  Marland Kitchen History of stress test 05/2010    A moderate perfusion defect is seen in the apical septal, Basal inferoseptal and mid inferoseptal region. post stress EF 53%. Wall motion abnormalities. Abnormal myocardial perfusion study.    Past Surgical History  Procedure Laterality Date  . Facial cellulitis  02/19/09  . Cardiac catheterization  04/01/09    which showed low normal EF at 50% with question of underlying borderline area of minimal inferoapical hypercontractility. He had mild coronary obstructive disease with coronary calcification and segmental 20% narrowing in the LAD proximally, 20% in the mid LAD with muscle bridging. There was calcification at the ostium of the RCA, and 20 to 30%narrowing of the proximal to mid RCA with 30% distal n  . Cholecystectomy       Allergies  No Known Allergies  HPI  The patient is a pleasant 81 year old male with past medical history of nonobstructive CAD based on cath 2011, history of TIA, diabetes, dementia, hypertension, history of seizure. 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 In 2011, cardiac catheterization revealed EF 50%, with mild coronary artery disease, 20% in proximal LAD and mid LAD was mild muscle bridging, calcification at the ostium of RCA with 20-30% narrowing in the proximal to mid RCA and a 30% distal narrowing. He had history of TIA with possible seizure and has been on Aggrenox and Keppra. Last echocardiogram on 08/29/2014 showed EF 40-45%. He is not on beta blocker due to orthostatic symptoms, he was last seen by Dr. Claiborne Billings in the office on 10/24/2014, who recommended lower extremity stocking for orthostatic hypotension. At that time, he also complained of some shortness of breath with activity.  Patient presented to [facility] on [facility]07[facility]2014 with dizziness after noted to have severe bradycardia consistent with third degree heart block at PCPs office. He states the symptoms started today in the last few hours. However patient also noted he has been having intermittent chest pain for a while. He also complained of intermittent shortness of breath as well. He is not on any AV nodal blocking agent. Cardiology has been consulted for third-degree heart block.  Home Medications  Prior to Admission medications   Medication Sig Start Date End Date Taking? Authorizing Provider  AGGRENOX 25-200 MG per 12 hr capsule TAKE 1 CAPSULE TWICE DAILY 01/28/14   Larey Seat, MD  atorvastatin (LIPITOR) 10 MG tablet Take 1 tablet (10 mg total) by mouth daily. 10/03/14   [name], MD  ketoconazole (NIZORAL) 2 %  shampoo AS NEEDED 06/12/14   Historical Provider, MD  latanoprost (XALATAN) 0.005 % ophthalmic solution  04/12/14   Historical Provider, MD  levETIRAcetam (KEPPRA XR) 500 MG 24 hr tablet TAKE 1 TABLET BY MOUTH EVERY DAY 09/29/14   Larey Seat, MD  loratadine (CLARITIN) 10 MG tablet Take 10 mg by mouth daily.    Historical Provider, MD  memantine (NAMENDA) 10 MG tablet TAKE 1 TABLET TWICE A DAY 07/10/14   Asencion Partridge Dohmeier, MD    Multiple Vitamins-Minerals (CENTRUM SILVER ADULT 50+ PO) Take 1 capsule by mouth daily.    Historical Provider, MD    Family History  Family History  Problem Relation Age of Onset  . Heart attack Father   . Cancer - Lung Sister   . Thyroid disease Child     Social History  Social History   Social History  . Marital Status: Married    Spouse Name: [name]  . Number of Children: 5  . Years of Education: College   Occupational History  . Not on file.   Social History Main Topics  . Smoking status: Never Smoker   . Smokeless tobacco: Never Used  . Alcohol Use: No  . Drug Use: No  . Sexual Activity: Not on file   Other Topics Concern  . Not on file   Social History Narrative   Patient is married [name]) and lives at home with wife and two children.   Patient has five adult children.   Patient is retired.   Patient has a college education.   Patient is right-handed.   Patient drinks two cups of coffee daily, 1/2 can of soda daily and tea- 2-3 times per week.     Review of Systems General:  No chills, fever, night sweats or weight changes.  Cardiovascular:  No edema, orthopnea, palpitations, paroxysmal nocturnal dyspnea. +chest pain, dyspnea on exertion Dermatological: No rash, lesions/masses Respiratory: No cough, dyspnea Urologic: No hematuria, dysuria Abdominal:   No nausea, vomiting, diarrhea, bright red blood per rectum, melena, or hematemesis Neurologic:  +dizziness started today All other systems reviewed and are otherwise negative except as noted above.  Physical Exam  Pulse 37, temperature 97.9 F (36.6 C), temperature source Oral, resp. rate 22, SpO2 96 %.  General: Pleasant, NAD Psych: Normal affect. Neuro: Alert and oriented X 3. Moves all extremities spontaneously. HEENT: Normal  Neck: Supple without bruits or JVD. Lungs:  Resp regular and unlabored, CTA. Heart: bradycardia. no s3, s4, or murmurs. Abdomen: Soft, non-tender, non-distended, BS +  x 4.  Extremities: No clubbing, cyanosis or edema. DP/PT/Radials 2+ and equal bilaterally.  Labs  Troponin [facility] of Care Test)  Recent Labs  11/07/14 1631  TROPIPOC 0.04   No results for input(s): CKTOTAL, CKMB, TROPONINI in the last 72 hours. Lab Results  Component Value Date   WBC 9.3 11/07/2014   HGB 12.6* 11/07/2014   HCT 37.0* 11/07/2014   MCV 97.8 11/07/2014   PLT 266 11/07/2014     Recent Labs Lab 11/07/14 1633  NA 138  K 4.2  CL 104  BUN 33*  CREATININE 1.40*  GLUCOSE 114*   Lab Results  Component Value Date   CHOL 127 10/24/2014   HDL 42 10/24/2014   LDLCALC 62 10/24/2014   TRIG 113 10/24/2014   No results found for: DDIMER   Radiology/Studies  No results found.  ECG  Third degree heart block with heart rate in 30s.  Echocardiogram 08/29/2014  LV EF: 40% -  45%  -------------------------------------------------------------------  Indications:   Shortness of Breath (R06.02).  ------------------------------------------------------------------- History:  PMH:  Congestive heart failure. Stroke. Transient ischemic attack. Risk factors: Family history of coronary artery disease. Hypertension. Diabetes mellitus. Dyslipidemia.  ------------------------------------------------------------------- Study Conclusions  - Left ventricle: The cavity size was normal. Wall thickness was normal. Systolic function was mildly to moderately reduced. The estimated ejection fraction was in the range of 40% to 45%. - Right ventricle: Systolic function was mildly reduced.    ASSESSMENT AND PLAN  1. Chest pain  - recent echo shows decrease in LV function down to 40%  - mild to moderate CAD on cath 2011, given new 3rd degree HB, will need cath today  - benefit and risk of the procedure explained by attending MD.   2. Third-degree heart block  - temporary pacing wire today during cath, if no fixable underlying dx, will need pacemaker  3.  nonobstructive CAD based on cath 2011 4. history of TIA 5. Hypertension 6. DM 7. Dementia 8. history of seizure   Signed, Almyra Deforest, [place] 11/07/2014, 4:48 PM  Agree with note by Almyra Deforest PA-C  81 year old married Caucasian male patient of Dr. Evette Georges with a month of progressive substernal chest pain associated with shortness of breath. In the ER today he is in complete heart block with a ventricular response in the 30s to 40s and relative hypotension. He is on no rate lowering drugs. There are no acute ST segment changes. His exam is benign. He'll need to go to the Cath Lab semi-urgently for coronary angiography and placement of a temporary transvenous pacing wire. He may ultimately need conversion to a permanent transvenous pacemaker. I discussed this with the patient, his wife and daughter who were present during the interview.  Lorretta Harp, M.D., [place], [facility], [name] [address] [place], [place]  [address]  [phone] 11/07/2014 4:54 PM

## 2014-11-07 NOTE — Progress Notes (Signed)
Site area: RFA Site Prior to Removal:  Level 0 Pressure Applied For:21min Manual: yes   Patient Status During Pull: stable  Post Pull Site:  Level 0 Post Pull Instructions Given:  yes Post Pull Pulses Present: palpable Dressing Applied:  clear Bedrest begins @ 1840 Comments:

## 2014-11-07 NOTE — Progress Notes (Signed)
Orthopedic Tech Progress Note Patient Details:  Thomas Long Dec 18, 1931 [id number]  Ortho Devices Type of Ortho Device: Knee Immobilizer Ortho Device/Splint Location: RLE Ortho Device/Splint Interventions: Ordered, Application   Braulio Bosch 11/07/2014, 8:01 PM

## 2014-11-07 NOTE — Progress Notes (Signed)
Pt has a temp pacer in the right femoral area, he is fidgety and unable to keep right leg still. Ordered and placed a knee immobilizer.

## 2014-11-08 ENCOUNTER — Encounter (HOSPITAL_COMMUNITY)
Admission: EM | Disposition: A | Discharge status: Home / Self Care | Payer: Self-pay | Source: Home / Self Care | Attending: Cardiovascular Disease

## 2014-11-08 ENCOUNTER — Encounter (HOSPITAL_COMMUNITY): Payer: Self-pay | Admitting: Cardiovascular Disease

## 2014-11-08 DIAGNOSIS — I519 Heart disease, unspecified: Secondary | ICD-10-CM

## 2014-11-08 DIAGNOSIS — I429 Cardiomyopathy, unspecified: Secondary | ICD-10-CM

## 2014-11-08 DIAGNOSIS — I442 Atrioventricular block, complete: Secondary | ICD-10-CM

## 2014-11-08 DIAGNOSIS — I5022 Chronic systolic (congestive) heart failure: Secondary | ICD-10-CM

## 2014-11-08 HISTORY — PX: EP IMPLANTABLE DEVICE: SHX172B

## 2014-11-08 LAB — CBC
HCT: 32.9 % — ABNORMAL LOW (ref 39.0–52.0)
Hemoglobin: 11.3 g/dL — ABNORMAL LOW (ref 13.0–17.0)
MCH: 33.4 pg (ref 26.0–34.0)
MCHC: 34.3 g/dL (ref 30.0–36.0)
MCV: 97.3 fL (ref 78.0–100.0)
Platelets: 234 10*3/uL (ref 150–400)
RBC: 3.38 MIL/uL — ABNORMAL LOW (ref 4.22–5.81)
RDW: 13.1 % (ref 11.5–15.5)
WBC: 9.2 10*3/uL (ref 4.0–10.5)

## 2014-11-08 LAB — BASIC METABOLIC PANEL
Anion gap: 8 (ref 5–15)
BUN: 21 mg/dL — ABNORMAL HIGH (ref 6–20)
CO2: 24 mmol/L (ref 22–32)
Calcium: 7.8 mg/dL — ABNORMAL LOW (ref 8.9–10.3)
Chloride: 104 mmol/L (ref 101–111)
Creatinine, Ser: 0.99 mg/dL (ref 0.61–1.24)
GFR calc Af Amer: >60 mL/min (ref >60)
GFR calc non Af Amer: >60 mL/min (ref >60)
Glucose, Bld: 99 mg/dL (ref 65–99)
Potassium: 4 mmol/L (ref 3.5–5.1)
Sodium: 136 mmol/L (ref 135–145)

## 2014-11-08 LAB — GLUCOSE, CAPILLARY
Glucose-Capillary: 85 mg/dL (ref 65–99)
Glucose-Capillary: 121 mg/dL — ABNORMAL HIGH (ref 65–99)

## 2014-11-08 SURGERY — PACEMAKER IMPLANT

## 2014-11-08 MED ORDER — ONDANSETRON HCL 4 MG/2ML IJ SOLN: 4.0000 mg | Freq: Four times a day (QID) | INTRAMUSCULAR | Status: DC | PRN

## 2014-11-08 MED ORDER — ACETAMINOPHEN 325 MG PO TABS
325.0000 mg | ORAL_TABLET | ORAL | Status: DC | PRN
  Administered 2014-11-08 – 2014-11-09 (×2): 650 mg via ORAL
  Filled 2014-11-08 (×3): qty 2

## 2014-11-08 MED ORDER — SODIUM CHLORIDE 0.9 % IV SOLN: INTRAVENOUS | Status: DC

## 2014-11-08 MED ORDER — CEFAZOLIN SODIUM-DEXTROSE 2-3 GM-% IV SOLR
2.0000 g | INTRAVENOUS | Status: DC
  Filled 2014-11-08 (×2): qty 50

## 2014-11-08 MED ORDER — CHLORHEXIDINE GLUCONATE 4 % EX LIQD
60.0000 mL | Freq: Once | CUTANEOUS | Status: DC
  Filled 2014-11-08: qty 60

## 2014-11-08 MED ORDER — SODIUM CHLORIDE 0.9 % IR SOLN
Status: DC | PRN
  Administered 2014-11-08: 13:00:00

## 2014-11-08 MED ORDER — CEFAZOLIN SODIUM 1-5 GM-% IV SOLN
1.0000 g | Freq: Four times a day (QID) | INTRAVENOUS | Status: AC
  Administered 2014-11-08 – 2014-11-09 (×3): 1 g via INTRAVENOUS
  Filled 2014-11-08 (×3): qty 50

## 2014-11-08 MED ORDER — SODIUM CHLORIDE 0.9 % IJ SOLN: 3.0000 mL | INTRAMUSCULAR | Status: DC | PRN

## 2014-11-08 MED ORDER — SODIUM CHLORIDE 0.9 % IR SOLN
Status: AC
  Filled 2014-11-08: qty 2

## 2014-11-08 MED ORDER — SODIUM CHLORIDE 0.9 % IJ SOLN
3.0000 mL | Freq: Two times a day (BID) | INTRAMUSCULAR | Status: DC
  Administered 2014-11-09 – 2014-11-10 (×3): 3 mL via INTRAVENOUS

## 2014-11-08 MED ORDER — HYDROCODONE-ACETAMINOPHEN 5-325 MG PO TABS
1.0000 | ORAL_TABLET | ORAL | Status: DC | PRN
  Administered 2014-11-08: 1 via ORAL
  Filled 2014-11-08: qty 1

## 2014-11-08 MED ORDER — SODIUM CHLORIDE 0.9 % IV SOLN: 250.0000 mL | INTRAVENOUS | Status: DC | PRN

## 2014-11-08 MED ORDER — SODIUM CHLORIDE 0.9 % IR SOLN
80.0000 mg | Status: DC
  Filled 2014-11-08 (×2): qty 2

## 2014-11-08 MED ORDER — IOHEXOL 350 MG/ML SOLN
INTRAVENOUS | Status: DC | PRN
  Administered 2014-11-08: 10 mL via INTRAVENOUS

## 2014-11-08 MED ORDER — LIDOCAINE HCL (PF) 1 % IJ SOLN
INTRAMUSCULAR | Status: AC
Start: 2014-11-08 — End: 2014-11-08
  Filled 2014-11-08: qty 30

## 2014-11-08 MED ORDER — YOU HAVE A PACEMAKER BOOK
Freq: Once | Status: AC
  Administered 2014-11-08: 23:00:00
  Filled 2014-11-08: qty 1

## 2014-11-08 MED ORDER — CEFAZOLIN SODIUM-DEXTROSE 2-3 GM-% IV SOLR
INTRAVENOUS | Status: DC | PRN
  Administered 2014-11-08: 2 g via INTRAVENOUS

## 2014-11-08 MED ORDER — LIDOCAINE HCL (PF) 1 % IJ SOLN
INTRAMUSCULAR | Status: AC
  Filled 2014-11-08: qty 30

## 2014-11-08 MED ORDER — LIDOCAINE HCL (PF) 1 % IJ SOLN
INTRAMUSCULAR | Status: DC | PRN
  Administered 2014-11-08: 40 mL

## 2014-11-08 SURGICAL SUPPLY — 16 items
ADAPTER SEALING SSA-EW-09 (MISCELLANEOUS) ×1 IMPLANT
ADPR INTRO LNG 9FR SL XTD WNG (MISCELLANEOUS) ×1
ALLURE CRT PM3262 (Pacemaker) ×2 IMPLANT
CABLE SURGICAL S-101-97-12 (CABLE) ×1 IMPLANT
CATH ATTAIN COM SURV 6250V-MB2 (CATHETERS) ×1 IMPLANT
CATH HEXAPOLAR DAMATO 6F (CATHETERS) ×1 IMPLANT
LEAD QUARTET 1458Q-86CM (Lead) ×1 IMPLANT
LEAD TENDRIL SDX 2088TC-52CM (Lead) ×1 IMPLANT
LEAD TENDRIL SDX 2088TC-58CM (Lead) ×1 IMPLANT
PACEMAKER ALLURE CRT (Pacemaker) IMPLANT
PAD DEFIB LIFELINK (PAD) ×1 IMPLANT
SHEATH CLASSIC 7F (SHEATH) ×2 IMPLANT
SHEATH CLASSIC 9.5F (SHEATH) ×1 IMPLANT
SLITTER 6232ADJ (MISCELLANEOUS) ×1 IMPLANT
TRAY PACEMAKER INSERTION (CUSTOM PROCEDURE TRAY) ×1 IMPLANT
WIRE ACUITY WHISPER EDS 4648 (WIRE) ×1 IMPLANT

## 2014-11-08 NOTE — Consult Note (Signed)
ELECTROPHYSIOLOGY CONSULT NOTE    Patient ID: Thomas Long MRN: [id number], DOB/AGE: 09/30/1931 81 y.o.  Admit date: 11/07/2014 Date of Consult: 11/08/2014  Primary Physician: Mathews Argyle, MD Primary Cardiologist: Claiborne Billings  Reason for Consultation: complete heart block  HPI:  Thomas Long is a 81 y.o. male with a past medical history significant for hypothyroidism, diabetes, hyperlipidemia, dementia, hypertension, orthostatic hypotension, and seizures. He has been followed closely by Dr Claiborne Billings and was just seen earlier this month. At that time, echocardiogram from July was reviewed which demonstrated EF 40-45% but ACE-I/BB not initiated because of issues with orthostatic hypotension. At that visit, he was also having increased shortness of breath with activity.  Support hose were recommended and he wore them for a day and a half but then developed chest pain with exertion and lightheadedness that waxed and waned over the next several days. He then developed non productive cough and was seen by his PCP who found him to be bradycardic.  EKG was obtained which demonstrated complete heart block and he was referred to the ER for further evaluation.   He underwent urgent catheterization which demonstrated no obstructive CAD and EF of 45%.  A temporary pacing wire was placed with improvement in symptoms. EP has been asked to evaluate for treatment options.   History and complete ROS is difficult to obtain because of baseline dementia.  He does say that his chest pain is resolved and his shortness of breath is stable.  He denies recent fevers or chills.  He has not had recent nausea or vomiting.   Past Medical History  Diagnosis Date  . Thyroid disorder   . Depression   . Anxiety   . Diabetes   . High cholesterol   . Dementia   . Hypertension   . Chronic pain   . Heart disease   . Headache(784.0)     migraines  . Seizures   . Stroke   . TIA (transient ischemic attack)   . Hx of  echocardiogram 05/2010    showed EF 50% with inferior hypokinesis. He did have mild mitral annular calcification, mild TR, mild aortic sclerosis without stenosis.  Marland Kitchen History of stress test 05/2010    A moderate perfusion defect is seen in the apical septal, Basal inferoseptal and mid inferoseptal region. post stress EF 53%. Wall motion abnormalities. Abnormal myocardial perfusion study.     Surgical History:  Past Surgical History  Procedure Laterality Date  . Facial cellulitis  02/19/09  . Cardiac catheterization  04/01/09    which showed low normal EF at 50% with question of underlying borderline area of minimal inferoapical hypercontractility. He had mild coronary obstructive disease with coronary calcification and segmental 20% narrowing in the LAD proximally, 20% in the mid LAD with muscle bridging. There was calcification at the ostium of the RCA, and 20 to 30%narrowing of the proximal to mid RCA with 30% distal n  . Cholecystectomy    . Cardiac catheterization N/A 11/07/2014    Procedure: Left Heart Cath and Coronary Angiography/ temp wire;  Surgeon: [name], MD;  Location: [facility];  Service: Cardiovascular;  Laterality: N/A;     Prescriptions prior to admission  Medication Sig Dispense Refill Last Dose  . AGGRENOX 25-200 MG per 12 hr capsule TAKE 1 CAPSULE TWICE DAILY 180 capsule 3 11/07/2014 at Unknown time  . atorvastatin (LIPITOR) 10 MG tablet Take 1 tablet (10 mg total) by mouth daily. 90 tablet 2 11/07/2014 at Unknown  time  . ketoconazole (NIZORAL) 2 % shampoo Apply 1 application topically daily as needed for irritation. AS NEEDED  5 unknown  . latanoprost (XALATAN) 0.005 % ophthalmic solution Place 1 drop into both eyes at bedtime.   3 11/06/2014 at Unknown time  . levETIRAcetam (KEPPRA XR) 500 MG 24 hr tablet TAKE 1 TABLET BY MOUTH EVERY DAY 90 tablet 1 11/06/2014 at Unknown time  . loratadine (CLARITIN) 10 MG tablet Take 10 mg by mouth daily.   11/07/2014 at Unknown  time  . memantine (NAMENDA) 10 MG tablet TAKE 1 TABLET TWICE A DAY 180 tablet 0 11/07/2014 at Unknown time  . Multiple Vitamins-Minerals (CENTRUM SILVER ADULT 50+ PO) Take 1 capsule by mouth daily.   11/07/2014 at Unknown time    Inpatient Medications:  . atorvastatin  10 mg Oral Daily  . dipyridamole-aspirin  1 capsule Oral BID  . heparin  5,000 Units Subcutaneous 3 times per day  . levETIRAcetam  500 mg Oral Daily  . loratadine  10 mg Oral Daily  . memantine  10 mg Oral BID  . sodium chloride  3 mL Intravenous Q12H    Allergies: No Known Allergies  Social History   Social History  . Marital Status: Married    Spouse Name: [name]  . Number of Children: 5  . Years of Education: College   Occupational History  . Not on file.   Social History Main Topics  . Smoking status: Never Smoker   . Smokeless tobacco: Never Used  . Alcohol Use: No  . Drug Use: No  . Sexual Activity: Not on file   Other Topics Concern  . Not on file   Social History Narrative   Patient is married [name]) and lives at home with wife and two children.   Patient has five adult children.   Patient is retired.   Patient has a college education.   Patient is right-handed.   Patient drinks two cups of coffee daily, 1/2 can of soda daily and tea- 2-3 times per week.     Family History  Problem Relation Age of Onset  . Heart attack Father   . Cancer - Lung Sister   . Thyroid disease Child      Review of Systems: All other systems reviewed and are otherwise negative except as noted above.  Physical Exam: Filed Vitals:   11/08/14 0404 11/08/14 0516 11/08/14 0600 11/08/14 0736  BP: 107/72 101/48 122/43   Pulse: 80 81 79   Temp: 98.1 F (36.7 C)   98.1 F (36.7 C)  TempSrc: Oral   Oral  Resp: 16 21 25    SpO2: 96% 93% 95%     GEN- The patient is well appearing, oriented to person today HEENT: normocephalic, atraumatic; sclera clear, conjunctiva pink; hearing intact; oropharynx clear; neck  supple  Lungs- Clear to ausculation bilaterally, normal work of breathing.  No wheezes, rales, rhonchi Heart- Regular rate and rhythm (paced) GI- soft, non-tender, non-distended, bowel sounds present  Extremities- no clubbing, cyanosis, or edema; DP/PT/radial pulses 2+ bilaterally, right groin with temp wire in place MS- no significant deformity or atrophy Skin- warm and dry, no rash or lesion Psych- euthymic mood, full affect Neuro- strength and sensation are intact  Labs:   Lab Results  Component Value Date   WBC 9.2 11/08/2014   HGB 11.3* 11/08/2014   HCT 32.9* 11/08/2014   MCV 97.3 11/08/2014   PLT 234 11/08/2014    Recent Labs Lab 11/08/14 0345  NA 136  K 4.0  CL 104  CO2 24  BUN 21*  CREATININE 0.99  CALCIUM 7.8*  GLUCOSE 99      Radiology/Studies: Dg Chest Port 1 View 11/07/2014   CLINICAL DATA:  Bradycardia  EXAM: PORTABLE CHEST - 1 VIEW  COMPARISON:  05/21/2010  FINDINGS: Cardiac shadow is within normal limits. Aortic calcifications are seen. The right hemidiaphragm is again elevated. No focal infiltrate or sizable effusion is noted.  IMPRESSION: No active disease.   Electronically Signed   By: Inez Catalina M.D.   On: 11/07/2014 20:01    GSU:PJSRP tach with complete heart block  TELEMETRY: V pacing  Assessment/Plan: 1.  Complete heart block The patient presented with symptomatic complete heart block. He is on no AVN blocking agents and catheterization demonstrated no obstructive CAD.  Discussed risks, benefits, and alternatives to pacemaker implantation with patient, wife and daughter who wish to proceed.  Will plan for later today.  2.  HTN Stable No change required today  3.  Orthostatic hypotension Improved following discontinuation of Bystolic earlier this year Unable to tolerate ACE-I/BB Recommend compression hose   4.  Dementia Continue home medications  Signed, Chanetta Marshall, NP 11/08/2014 8:18 AM  I have seen, examined the patient, and  reviewed the above assessment and plan. On exam, pleasant RRR.  Changes to above are made where necessary.   The patient has symptomatic complete heart block.  No reversible causes are found.  I would therefore recommend pacemaker implantation at this time.  Given reduced EF, it would be ideal to place an LV lead as he will likely pace frequently.  Risks, benefits, alternatives to biventricular pacemaker implantation were discussed in detail with the patient today. The patient understands that the risks include but are not limited to bleeding, infection, pneumothorax, perforation, tamponade, vascular damage, renal failure, MI, stroke, death,  and lead dislodgement and wishes to proceed. We will therefore schedule the procedure at the next available time.  Co Sign: [name], MD 11/08/2014 10:54 AM

## 2014-11-08 NOTE — Care Management Note (Signed)
Case Management Note  Patient Details  Name: Thomas Long MRN: [id number] Date of Birth: [birth year]  Subjective/Objective:   Adm w  Comlete heart block                 Action/Plan:lives w fam, pcp dr Christiane Ha [place]   Expected Discharge Date:                  Expected Discharge Plan:     In-House Referral:     Discharge planning Services     Post Acute Care Choice:    Choice offered to:     DME Arranged:    DME Agency:     HH Arranged:    [place] Agency:     Status of Service:     Medicare Important Message Given:    Date Medicare IM Given:    Medicare IM give by:    Date Additional Medicare IM Given:    Additional Medicare Important Message give by:     If discussed at [place] of Stay Meetings, dates discussed:    Additional Comments: ur review done  Lacretia Leigh, RN 11/08/2014, 8:10 AM

## 2014-11-08 NOTE — Telephone Encounter (Signed)
Taken care of; pt had temp pacer on 9/29 and permanent pacer on 9/30

## 2014-11-08 NOTE — H&P (View-Only) (Signed)
ELECTROPHYSIOLOGY CONSULT NOTE    Patient ID: Thomas Long MRN: [id number], DOB/AGE: 1931/08/09 81 y.o.  Admit date: 11/07/2014 Date of Consult: 11/08/2014  Primary Physician: Mathews Argyle, MD Primary Cardiologist: Claiborne Billings  Reason for Consultation: complete heart block  HPI:  Thomas Long is a 81 y.o. male with a past medical history significant for hypothyroidism, diabetes, hyperlipidemia, dementia, hypertension, orthostatic hypotension, and seizures. He has been followed closely by Dr Claiborne Billings and was just seen earlier this month. At that time, echocardiogram from July was reviewed which demonstrated EF 40-45% but ACE-I/BB not initiated because of issues with orthostatic hypotension. At that visit, he was also having increased shortness of breath with activity.  Support hose were recommended and he wore them for a day and a half but then developed chest pain with exertion and lightheadedness that waxed and waned over the next several days. He then developed non productive cough and was seen by his PCP who found him to be bradycardic.  EKG was obtained which demonstrated complete heart block and he was referred to the ER for further evaluation.   He underwent urgent catheterization which demonstrated no obstructive CAD and EF of 45%.  A temporary pacing wire was placed with improvement in symptoms. EP has been asked to evaluate for treatment options.   History and complete ROS is difficult to obtain because of baseline dementia.  He does say that his chest pain is resolved and his shortness of breath is stable.  He denies recent fevers or chills.  He has not had recent nausea or vomiting.   Past Medical History  Diagnosis Date  . Thyroid disorder   . Depression   . Anxiety   . Diabetes   . High cholesterol   . Dementia   . Hypertension   . Chronic pain   . Heart disease   . Headache(784.0)     migraines  . Seizures   . Stroke   . TIA (transient ischemic attack)   . Hx of  echocardiogram 05/2010    showed EF 50% with inferior hypokinesis. He did have mild mitral annular calcification, mild TR, mild aortic sclerosis without stenosis.  Marland Kitchen History of stress test 05/2010    A moderate perfusion defect is seen in the apical septal, Basal inferoseptal and mid inferoseptal region. post stress EF 53%. Wall motion abnormalities. Abnormal myocardial perfusion study.     Surgical History:  Past Surgical History  Procedure Laterality Date  . Facial cellulitis  02/19/09  . Cardiac catheterization  04/01/09    which showed low normal EF at 50% with question of underlying borderline area of minimal inferoapical hypercontractility. He had mild coronary obstructive disease with coronary calcification and segmental 20% narrowing in the LAD proximally, 20% in the mid LAD with muscle bridging. There was calcification at the ostium of the RCA, and 20 to 30%narrowing of the proximal to mid RCA with 30% distal n  . Cholecystectomy    . Cardiac catheterization N/A 11/07/2014    Procedure: Left Heart Cath and Coronary Angiography/ temp wire;  Surgeon: [name], MD;  Location: [facility];  Service: Cardiovascular;  Laterality: N/A;     Prescriptions prior to admission  Medication Sig Dispense Refill Last Dose  . AGGRENOX 25-200 MG per 12 hr capsule TAKE 1 CAPSULE TWICE DAILY 180 capsule 3 11/07/2014 at Unknown time  . atorvastatin (LIPITOR) 10 MG tablet Take 1 tablet (10 mg total) by mouth daily. 90 tablet 2 11/07/2014 at Unknown  time  . ketoconazole (NIZORAL) 2 % shampoo Apply 1 application topically daily as needed for irritation. AS NEEDED  5 unknown  . latanoprost (XALATAN) 0.005 % ophthalmic solution Place 1 drop into both eyes at bedtime.   3 11/06/2014 at Unknown time  . levETIRAcetam (KEPPRA XR) 500 MG 24 hr tablet TAKE 1 TABLET BY MOUTH EVERY DAY 90 tablet 1 11/06/2014 at Unknown time  . loratadine (CLARITIN) 10 MG tablet Take 10 mg by mouth daily.   11/07/2014 at Unknown  time  . memantine (NAMENDA) 10 MG tablet TAKE 1 TABLET TWICE A DAY 180 tablet 0 11/07/2014 at Unknown time  . Multiple Vitamins-Minerals (CENTRUM SILVER ADULT 50+ PO) Take 1 capsule by mouth daily.   11/07/2014 at Unknown time    Inpatient Medications:  . atorvastatin  10 mg Oral Daily  . dipyridamole-aspirin  1 capsule Oral BID  . heparin  5,000 Units Subcutaneous 3 times per day  . levETIRAcetam  500 mg Oral Daily  . loratadine  10 mg Oral Daily  . memantine  10 mg Oral BID  . sodium chloride  3 mL Intravenous Q12H    Allergies: No Known Allergies  Social History   Social History  . Marital Status: Married    Spouse Name: [name]  . Number of Children: 5  . Years of Education: College   Occupational History  . Not on file.   Social History Main Topics  . Smoking status: Never Smoker   . Smokeless tobacco: Never Used  . Alcohol Use: No  . Drug Use: No  . Sexual Activity: Not on file   Other Topics Concern  . Not on file   Social History Narrative   Patient is married [name]) and lives at home with wife and two children.   Patient has five adult children.   Patient is retired.   Patient has a college education.   Patient is right-handed.   Patient drinks two cups of coffee daily, 1/2 can of soda daily and tea- 2-3 times per week.     Family History  Problem Relation Age of Onset  . Heart attack Father   . Cancer - Lung Sister   . Thyroid disease Child      Review of Systems: All other systems reviewed and are otherwise negative except as noted above.  Physical Exam: Filed Vitals:   11/08/14 0404 11/08/14 0516 11/08/14 0600 11/08/14 0736  BP: 107/72 101/48 122/43   Pulse: 80 81 79   Temp: 98.1 F (36.7 C)   98.1 F (36.7 C)  TempSrc: Oral   Oral  Resp: 16 21 25    SpO2: 96% 93% 95%     GEN- The patient is well appearing, oriented to person today HEENT: normocephalic, atraumatic; sclera clear, conjunctiva pink; hearing intact; oropharynx clear; neck  supple  Lungs- Clear to ausculation bilaterally, normal work of breathing.  No wheezes, rales, rhonchi Heart- Regular rate and rhythm (paced) GI- soft, non-tender, non-distended, bowel sounds present  Extremities- no clubbing, cyanosis, or edema; DP/PT/radial pulses 2+ bilaterally, right groin with temp wire in place MS- no significant deformity or atrophy Skin- warm and dry, no rash or lesion Psych- euthymic mood, full affect Neuro- strength and sensation are intact  Labs:   Lab Results  Component Value Date   WBC 9.2 11/08/2014   HGB 11.3* 11/08/2014   HCT 32.9* 11/08/2014   MCV 97.3 11/08/2014   PLT 234 11/08/2014    Recent Labs Lab 11/08/14 0345  NA 136  K 4.0  CL 104  CO2 24  BUN 21*  CREATININE 0.99  CALCIUM 7.8*  GLUCOSE 99      Radiology/Studies: Dg Chest Port 1 View 11/07/2014   CLINICAL DATA:  Bradycardia  EXAM: PORTABLE CHEST - 1 VIEW  COMPARISON:  05/21/2010  FINDINGS: Cardiac shadow is within normal limits. Aortic calcifications are seen. The right hemidiaphragm is again elevated. No focal infiltrate or sizable effusion is noted.  IMPRESSION: No active disease.   Electronically Signed   By: Inez Catalina M.D.   On: 11/07/2014 20:01    IWO:EHOZY tach with complete heart block  TELEMETRY: V pacing  Assessment/Plan: 1.  Complete heart block The patient presented with symptomatic complete heart block. He is on no AVN blocking agents and catheterization demonstrated no obstructive CAD.  Discussed risks, benefits, and alternatives to pacemaker implantation with patient, wife and daughter who wish to proceed.  Will plan for later today.  2.  HTN Stable No change required today  3.  Orthostatic hypotension Improved following discontinuation of Bystolic earlier this year Unable to tolerate ACE-I/BB Recommend compression hose   4.  Dementia Continue home medications  Signed, Chanetta Marshall, NP 11/08/2014 8:18 AM  I have seen, examined the patient, and  reviewed the above assessment and plan. On exam, pleasant RRR.  Changes to above are made where necessary.   The patient has symptomatic complete heart block.  No reversible causes are found.  I would therefore recommend pacemaker implantation at this time.  Given reduced EF, it would be ideal to place an LV lead as he will likely pace frequently.  Risks, benefits, alternatives to biventricular pacemaker implantation were discussed in detail with the patient today. The patient understands that the risks include but are not limited to bleeding, infection, pneumothorax, perforation, tamponade, vascular damage, renal failure, MI, stroke, death,  and lead dislodgement and wishes to proceed. We will therefore schedule the procedure at the next available time.  Co Sign: [name], MD 11/08/2014 10:54 AM

## 2014-11-08 NOTE — Interval H&P Note (Signed)
History and Physical Interval Note:  11/08/2014 10:58 AM  Thomas Long  has presented today for surgery, with the diagnosis of hb  The various methods of treatment have been discussed with the patient and family. After consideration of risks, benefits and other options for treatment, the patient has consented to biventricular pacemaker as a surgical intervention .  The patient's history has been reviewed, patient examined, no change in status, stable for surgery.  I have reviewed the patient's chart and labs.  Questions were answered to the patient's satisfaction.     [name]

## 2014-11-09 ENCOUNTER — Inpatient Hospital Stay (HOSPITAL_COMMUNITY): Payer: Medicare Other

## 2014-11-09 LAB — GLUCOSE, CAPILLARY
Glucose-Capillary: 169 mg/dL — ABNORMAL HIGH (ref 65–99)
Glucose-Capillary: 113 mg/dL — ABNORMAL HIGH (ref 65–99)
Glucose-Capillary: 103 mg/dL — ABNORMAL HIGH (ref 65–99)
Glucose-Capillary: 127 mg/dL — ABNORMAL HIGH (ref 65–99)

## 2014-11-09 MED ORDER — CHLORHEXIDINE GLUCONATE 4 % EX LIQD
60.0000 mL | Freq: Once | CUTANEOUS | Status: AC
  Administered 2014-11-10: 4 via TOPICAL
  Filled 2014-11-09: qty 60
  Filled 2014-11-09: qty 15

## 2014-11-09 MED ORDER — SODIUM CHLORIDE 0.9 % IR SOLN
80.0000 mg | Status: AC
  Administered 2014-11-10: 80 mg

## 2014-11-09 MED ORDER — CEFAZOLIN SODIUM-DEXTROSE 2-3 GM-% IV SOLR
2.0000 g | INTRAVENOUS | Status: AC
  Administered 2014-11-10: 2 g via INTRAVENOUS
  Filled 2014-11-09: qty 50

## 2014-11-09 MED ORDER — CHLORHEXIDINE GLUCONATE 4 % EX LIQD
60.0000 mL | Freq: Once | CUTANEOUS | Status: AC
  Filled 2014-11-09: qty 60

## 2014-11-09 MED ORDER — SODIUM CHLORIDE 0.9 % IV SOLN: INTRAVENOUS | Status: DC

## 2014-11-09 MED ORDER — SODIUM CHLORIDE 0.45 % IV SOLN
INTRAVENOUS | Status: DC
  Administered 2014-11-10: 06:00:00 via INTRAVENOUS

## 2014-11-09 NOTE — Progress Notes (Signed)
SUBJECTIVE: The patient is doing well today.  At this time, he denies chest pain, shortness of breath, or any new concerns.  Marland Kitchen atorvastatin  10 mg Oral Daily  . levETIRAcetam  500 mg Oral Daily  . loratadine  10 mg Oral Daily  . memantine  10 mg Oral BID  . sodium chloride  3 mL Intravenous Q12H      OBJECTIVE: Physical Exam: Filed Vitals:   11/08/14 1700 11/08/14 1954 11/09/14 0311 11/09/14 0758  BP: 121/82 109/37 128/51 140/68  Pulse: 60 60 68 72  Temp:  98 F (36.7 C) 97.7 F (36.5 C) 97.9 F (36.6 C)  TempSrc:  Oral Oral Axillary  Resp: 20 30 18 20   Height:   5\' 9"  (1.753 m)   Weight:   202 lb 9.6 oz (91.9 kg)   SpO2: 96% 93% 92% 94%    Intake/Output Summary (Last 24 hours) at 11/09/14 0820 Last data filed at 11/09/14 0700  Gross per 24 hour  Intake    860 ml  Output    750 ml  Net    110 ml    Telemetry reveals sinus rhythm with V pacing though there is AV dissociation  GEN- The patient is well appearing, alert and oriented x 3 today.   Head- normocephalic, atraumatic Eyes-  Sclera clear, conjunctiva pink Ears- hearing intact Oropharynx- clear Neck- supple,  Lungs- Clear to ausculation bilaterally, normal work of breathing Heart- Regular rate and rhythm  GI- soft, NT, ND, + BS Extremities- no clubbing, cyanosis, or edema Skin- pacer site looks good  LABS: Basic Metabolic Panel:  Recent Labs  11/07/14 1620 11/07/14 1633 11/08/14 0345  NA 135 138 136  K 4.3 4.2 4.0  CL 103 104 104  CO2 24  --  24  GLUCOSE 118* 114* 99  BUN 31* 33* 21*  CREATININE 1.40* 1.40* 0.99  CALCIUM 8.3*  --  7.8*  MG 2.2  --   --    Liver Function Tests: No results for input(s): AST, ALT, ALKPHOS, BILITOT, PROT, ALBUMIN in the last 72 hours. No results for input(s): LIPASE, AMYLASE in the last 72 hours. CBC:  Recent Labs  11/07/14 1620 11/07/14 1633 11/08/14 0345  WBC 9.3  --  9.2  NEUTROABS 6.6  --   --   HGB 12.1* 12.6* 11.3*  HCT 35.9* 37.0* 32.9*  MCV  97.8  --  97.3  PLT 266  --  234   RADIOLOGY: Dg Chest 2 View  11/09/2014   CLINICAL DATA:  Cardiac arrhythmias/pacemaker insertion  EXAM: CHEST  2 VIEW  COMPARISON:  November 07, 2014  FINDINGS: There is no a pacemaker on the left with leads attached to the right atrium, right ventricle, and left ventricle. No pneumothorax. There is no edema or consolidation. The heart size and pulmonary vascularity are normal. No adenopathy. There is atherosclerotic calcification in the aorta.  IMPRESSION: Pacemaker with leads as described. No pneumothorax. No edema or consolidation. No change in cardiac silhouette.   Electronically Signed   By: Lowella Grip III M.D.   On: 11/09/2014 07:05   Dg Chest Port 1 View  11/07/2014   CLINICAL DATA:  Bradycardia  EXAM: PORTABLE CHEST - 1 VIEW  COMPARISON:  05/21/2010  FINDINGS: Cardiac shadow is within normal limits. Aortic calcifications are seen. The right hemidiaphragm is again elevated. No focal infiltrate or sizable effusion is noted.  IMPRESSION: No active disease.   Electronically Signed   By: Inez Catalina  M.D.   On: 11/07/2014 20:01    ASSESSMENT AND PLAN:  Principal Problem:   Complete heart block Active Problems:   TIA (transient ischemic attack)   CAD in native artery   Hyperlipidemia with target LDL less than 70  1. Complete heart block Doing well s/p PPM Unfortunately, RA lead is dislodged.  Both V leads have less lead slack.  He will need lead revision. I have spoken with cath lab supervisor and we are hoping to do this tomorrow am. For now, he is programmed VVI with high V output.  2. Chronic systolic dysfunction Stable No change required today  Ok to have up to chair with assistance today  [name], MD 11/09/2014 8:20 AM

## 2014-11-10 ENCOUNTER — Encounter (HOSPITAL_COMMUNITY)
Admission: EM | Disposition: A | Discharge status: Home / Self Care | Payer: Self-pay | Source: Home / Self Care | Attending: Cardiovascular Disease

## 2014-11-10 DIAGNOSIS — T82120A Displacement of cardiac electrode, initial encounter: Secondary | ICD-10-CM

## 2014-11-10 DIAGNOSIS — I442 Atrioventricular block, complete: Secondary | ICD-10-CM

## 2014-11-10 HISTORY — PX: EP IMPLANTABLE DEVICE: SHX172B

## 2014-11-10 LAB — CBC
HCT: 32.5 % — ABNORMAL LOW (ref 39.0–52.0)
Hemoglobin: 11.1 g/dL — ABNORMAL LOW (ref 13.0–17.0)
MCH: 32.9 pg (ref 26.0–34.0)
MCHC: 34.2 g/dL (ref 30.0–36.0)
MCV: 96.4 fL (ref 78.0–100.0)
Platelets: 216 10*3/uL (ref 150–400)
RBC: 3.37 MIL/uL — ABNORMAL LOW (ref 4.22–5.81)
RDW: 12.9 % (ref 11.5–15.5)
WBC: 8.4 10*3/uL (ref 4.0–10.5)

## 2014-11-10 LAB — BASIC METABOLIC PANEL
Anion gap: 7 (ref 5–15)
BUN: 16 mg/dL (ref 6–20)
CO2: 24 mmol/L (ref 22–32)
Calcium: 8 mg/dL — ABNORMAL LOW (ref 8.9–10.3)
Chloride: 103 mmol/L (ref 101–111)
Creatinine, Ser: 0.92 mg/dL (ref 0.61–1.24)
GFR calc Af Amer: >60 mL/min (ref >60)
GFR calc non Af Amer: >60 mL/min (ref >60)
Glucose, Bld: 115 mg/dL — ABNORMAL HIGH (ref 65–99)
Potassium: 4.3 mmol/L (ref 3.5–5.1)
Sodium: 134 mmol/L — ABNORMAL LOW (ref 135–145)

## 2014-11-10 LAB — GLUCOSE, CAPILLARY: Glucose-Capillary: 112 mg/dL — ABNORMAL HIGH (ref 65–99)

## 2014-11-10 SURGERY — LEAD REVISION/REPAIR
Anesthesia: LOCAL

## 2014-11-10 MED ORDER — LIDOCAINE HCL (PF) 1 % IJ SOLN
INTRAMUSCULAR | Status: DC | PRN
  Administered 2014-11-10: 45 mL via INTRADERMAL

## 2014-11-10 MED ORDER — HYDROCODONE-ACETAMINOPHEN 5-325 MG PO TABS: 1.0000 | ORAL_TABLET | ORAL | Status: DC | PRN

## 2014-11-10 MED ORDER — SODIUM CHLORIDE 0.9 % IV SOLN: 250.0000 mL | INTRAVENOUS | Status: DC | PRN

## 2014-11-10 MED ORDER — LIDOCAINE HCL (PF) 1 % IJ SOLN
INTRAMUSCULAR | Status: AC
  Filled 2014-11-10: qty 30

## 2014-11-10 MED ORDER — LIDOCAINE HCL (PF) 1 % IJ SOLN
INTRAMUSCULAR | Status: AC
  Filled 2014-11-10: qty 60

## 2014-11-10 MED ORDER — CEFAZOLIN SODIUM 1-5 GM-% IV SOLN
1.0000 g | Freq: Four times a day (QID) | INTRAVENOUS | Status: AC
  Administered 2014-11-10 – 2014-11-11 (×3): 1 g via INTRAVENOUS
  Filled 2014-11-10 (×3): qty 50

## 2014-11-10 MED ORDER — HEPARIN (PORCINE) IN NACL 2-0.9 UNIT/ML-% IJ SOLN
INTRAMUSCULAR | Status: DC | PRN
  Administered 2014-11-10: 10:00:00

## 2014-11-10 MED ORDER — SODIUM CHLORIDE 0.9 % IJ SOLN: 3.0000 mL | INTRAMUSCULAR | Status: DC | PRN

## 2014-11-10 MED ORDER — ACETAMINOPHEN 325 MG PO TABS
325.0000 mg | ORAL_TABLET | ORAL | Status: DC | PRN
  Administered 2014-11-10 – 2014-11-11 (×4): 650 mg via ORAL
  Filled 2014-11-10 (×4): qty 2

## 2014-11-10 MED ORDER — SODIUM CHLORIDE 0.9 % IJ SOLN
3.0000 mL | Freq: Two times a day (BID) | INTRAMUSCULAR | Status: DC
  Administered 2014-11-10 – 2014-11-11 (×2): 3 mL via INTRAVENOUS

## 2014-11-10 MED ORDER — SODIUM CHLORIDE 0.9 % IR SOLN
Status: AC
  Filled 2014-11-10: qty 2

## 2014-11-10 MED ORDER — ONDANSETRON HCL 4 MG/2ML IJ SOLN: 4.0000 mg | Freq: Four times a day (QID) | INTRAMUSCULAR | Status: DC | PRN

## 2014-11-10 MED ORDER — HEPARIN (PORCINE) IN NACL 2-0.9 UNIT/ML-% IJ SOLN
INTRAMUSCULAR | Status: AC
  Filled 2014-11-10: qty 500

## 2014-11-10 MED ORDER — CEFAZOLIN SODIUM-DEXTROSE 2-3 GM-% IV SOLR
INTRAVENOUS | Status: AC
  Filled 2014-11-10: qty 50

## 2014-11-10 SURGICAL SUPPLY — 5 items
CABLE SURGICAL S-101-97-12 (CABLE) ×1 IMPLANT
LEAD TENDRIL SDX 2088TC-52CM (Lead) ×1 IMPLANT
PAD DEFIB LIFELINK (PAD) ×1 IMPLANT
SHEATH CLASSIC 7F (SHEATH) ×1 IMPLANT
TRAY PACEMAKER INSERTION (CUSTOM PROCEDURE TRAY) ×1 IMPLANT

## 2014-11-10 NOTE — Progress Notes (Signed)
SUBJECTIVE: The patient is doing well today.  At this time, he denies chest pain, shortness of breath, or any new concerns.  Marland Kitchen atorvastatin  10 mg Oral Daily  .  ceFAZolin (ANCEF) IV  2 g Intravenous To Cath  . gentamicin irrigation  80 mg Irrigation On Call  . levETIRAcetam  500 mg Oral Daily  . loratadine  10 mg Oral Daily  . memantine  10 mg Oral BID  . sodium chloride  3 mL Intravenous Q12H   . sodium chloride 10 mL/hr at 11/10/14 0618  . sodium chloride      OBJECTIVE: Physical Exam: Filed Vitals:   11/09/14 0758 11/09/14 1100 11/09/14 2100 11/10/14 0500  BP: 140/68 123/61 108/58 132/57  Pulse: 72  71 69  Temp: 97.9 F (36.6 C) 98.3 F (36.8 C) 97.9 F (36.6 C) 98.2 F (36.8 C)  TempSrc: Axillary Oral    Resp: 20 18 18 17   Height:  5\' 10"  (1.778 m)    Weight:  202 lb (91.627 kg)  199 lb 3.2 oz (90.357 kg)  SpO2: 94% 96% 90% 94%    Intake/Output Summary (Last 24 hours) at 11/10/14 0758 Last data filed at 11/09/14 2110  Gross per 24 hour  Intake    480 ml  Output      0 ml  Net    480 ml    Telemetry reveals sinus rhythm with V pacing though there is AV dissociation  GEN- The patient is well appearing, alert and oriented x 3 today.   Head- normocephalic, atraumatic Eyes-  Sclera clear, conjunctiva pink Ears- hearing intact Oropharynx- clear Neck- supple,  Lungs- Clear to ausculation bilaterally, normal work of breathing Heart- Regular rate and rhythm  GI- soft, NT, ND, + BS Extremities- no clubbing, cyanosis, or edema Skin- pacer site looks good  LABS: Basic Metabolic Panel:  Recent Labs  11/07/14 1620  11/08/14 0345 11/10/14 0334  NA 135  < > 136 134*  K 4.3  < > 4.0 4.3  CL 103  < > 104 103  CO2 24  --  24 24  GLUCOSE 118*  < > 99 115*  BUN 31*  < > 21* 16  CREATININE 1.40*  < > 0.99 0.92  CALCIUM 8.3*  --  7.8* 8.0*  MG 2.2  --   --   --   < > = values in this interval not displayed. Liver Function Tests: No results for input(s):  AST, ALT, ALKPHOS, BILITOT, PROT, ALBUMIN in the last 72 hours. No results for input(s): LIPASE, AMYLASE in the last 72 hours. CBC:  Recent Labs  11/07/14 1620  11/08/14 0345 11/10/14 0334  WBC 9.3  --  9.2 8.4  NEUTROABS 6.6  --   --   --   HGB 12.1*  < > 11.3* 11.1*  HCT 35.9*  < > 32.9* 32.5*  MCV 97.8  --  97.3 96.4  PLT 266  --  234 216  < > = values in this interval not displayed. RADIOLOGY:  ASSESSMENT AND PLAN:  Principal Problem:   Complete heart block (HCC) Active Problems:   TIA (transient ischemic attack)   CAD in native artery   Hyperlipidemia with target LDL less than 70  1. Complete heart block Doing well s/p PPM Unfortunately, RA lead is dislodged.  Both V leads have less lead slack and threshlds are increased.  He will need lead revision today. Risks, benefits, alternatives to pacemaker system revision were discussed  in detail with the patient today. The patient understands that the risks include but are not limited to bleeding, infection, pneumothorax, perforation, tamponade, vascular damage, renal failure, MI, stroke, death,  and lead dislodgement and wishes to proceed.    2. Chronic systolic dysfunction Stable No change required today    [name], MD 11/10/2014 7:58 AM

## 2014-11-10 NOTE — H&P (View-Only) (Signed)
SUBJECTIVE: The patient is doing well today.  At this time, he denies chest pain, shortness of breath, or any new concerns.  Marland Kitchen atorvastatin  10 mg Oral Daily  .  ceFAZolin (ANCEF) IV  2 g Intravenous To Cath  . gentamicin irrigation  80 mg Irrigation On Call  . levETIRAcetam  500 mg Oral Daily  . loratadine  10 mg Oral Daily  . memantine  10 mg Oral BID  . sodium chloride  3 mL Intravenous Q12H   . sodium chloride 10 mL/hr at 11/10/14 0618  . sodium chloride      OBJECTIVE: Physical Exam: Filed Vitals:   11/09/14 0758 11/09/14 1100 11/09/14 2100 11/10/14 0500  BP: 140/68 123/61 108/58 132/57  Pulse: 72  71 69  Temp: 97.9 F (36.6 C) 98.3 F (36.8 C) 97.9 F (36.6 C) 98.2 F (36.8 C)  TempSrc: Axillary Oral    Resp: 20 18 18 17   Height:  5\' 10"  (1.778 m)    Weight:  202 lb (91.627 kg)  199 lb 3.2 oz (90.357 kg)  SpO2: 94% 96% 90% 94%    Intake/Output Summary (Last 24 hours) at 11/10/14 0758 Last data filed at 11/09/14 2110  Gross per 24 hour  Intake    480 ml  Output      0 ml  Net    480 ml    Telemetry reveals sinus rhythm with V pacing though there is AV dissociation  GEN- The patient is well appearing, alert and oriented x 3 today.   Head- normocephalic, atraumatic Eyes-  Sclera clear, conjunctiva pink Ears- hearing intact Oropharynx- clear Neck- supple,  Lungs- Clear to ausculation bilaterally, normal work of breathing Heart- Regular rate and rhythm  GI- soft, NT, ND, + BS Extremities- no clubbing, cyanosis, or edema Skin- pacer site looks good  LABS: Basic Metabolic Panel:  Recent Labs  11/07/14 1620  11/08/14 0345 11/10/14 0334  NA 135  < > 136 134*  K 4.3  < > 4.0 4.3  CL 103  < > 104 103  CO2 24  --  24 24  GLUCOSE 118*  < > 99 115*  BUN 31*  < > 21* 16  CREATININE 1.40*  < > 0.99 0.92  CALCIUM 8.3*  --  7.8* 8.0*  MG 2.2  --   --   --   < > = values in this interval not displayed. Liver Function Tests: No results for input(s):  AST, ALT, ALKPHOS, BILITOT, PROT, ALBUMIN in the last 72 hours. No results for input(s): LIPASE, AMYLASE in the last 72 hours. CBC:  Recent Labs  11/07/14 1620  11/08/14 0345 11/10/14 0334  WBC 9.3  --  9.2 8.4  NEUTROABS 6.6  --   --   --   HGB 12.1*  < > 11.3* 11.1*  HCT 35.9*  < > 32.9* 32.5*  MCV 97.8  --  97.3 96.4  PLT 266  --  234 216  < > = values in this interval not displayed. RADIOLOGY:  ASSESSMENT AND PLAN:  Principal Problem:   Complete heart block (HCC) Active Problems:   TIA (transient ischemic attack)   CAD in native artery   Hyperlipidemia with target LDL less than 70  1. Complete heart block Doing well s/p PPM Unfortunately, RA lead is dislodged.  Both V leads have less lead slack and threshlds are increased.  He will need lead revision today. Risks, benefits, alternatives to pacemaker system revision were discussed  in detail with the patient today. The patient understands that the risks include but are not limited to bleeding, infection, pneumothorax, perforation, tamponade, vascular damage, renal failure, MI, stroke, death,  and lead dislodgement and wishes to proceed.    2. Chronic systolic dysfunction Stable No change required today    [name], MD 11/10/2014 7:58 AM

## 2014-11-10 NOTE — Interval H&P Note (Signed)
History and Physical Interval Note:  11/10/2014 9:11 AM  Thomas Long  has presented today for surgery, with the diagnosis of lead dislodged  The various methods of treatment have been discussed with the patient and family. After consideration of risks, benefits and other options for treatment, the patient has consented to  Procedure(s): Lead Revision/Repair (N/A) as a surgical intervention .  The patient's history has been reviewed, patient examined, no change in status, stable for surgery.  I have reviewed the patient's chart and labs.  Questions were answered to the patient's satisfaction.     [name]

## 2014-11-11 ENCOUNTER — Inpatient Hospital Stay (HOSPITAL_COMMUNITY): Payer: Medicare Other

## 2014-11-11 ENCOUNTER — Encounter (HOSPITAL_COMMUNITY): Payer: Self-pay | Admitting: Internal Medicine

## 2014-11-11 LAB — BASIC METABOLIC PANEL
Anion gap: 6 (ref 5–15)
BUN: 14 mg/dL (ref 6–20)
CO2: 25 mmol/L (ref 22–32)
Calcium: 8 mg/dL — ABNORMAL LOW (ref 8.9–10.3)
Chloride: 104 mmol/L (ref 101–111)
Creatinine, Ser: 0.95 mg/dL (ref 0.61–1.24)
GFR calc Af Amer: >60 mL/min (ref >60)
GFR calc non Af Amer: >60 mL/min (ref >60)
Glucose, Bld: 102 mg/dL — ABNORMAL HIGH (ref 65–99)
Potassium: 3.9 mmol/L (ref 3.5–5.1)
Sodium: 135 mmol/L (ref 135–145)

## 2014-11-11 LAB — GLUCOSE, CAPILLARY: Glucose-Capillary: 148 mg/dL — ABNORMAL HIGH (ref 65–99)

## 2014-11-11 NOTE — Discharge Instructions (Signed)
° °  Supplemental Discharge Instructions for  Pacemaker/Defibrillator Patients  Activity No heavy lifting or vigorous activity with your left/right arm for 6 to 8 weeks.  Do not raise your left/right arm above your head for one week.  Gradually raise your affected arm as drawn below.                       10/06                   10/07                    10/08                    10/09            NO DRIVING WOUND CARE - Keep the wound area clean and dry.  Do not get this area wet for one week. No showers for one week; you may shower on      11/17/2014        . - The tape/steri-strips on your wound will fall off; do not pull them off.  No bandage is needed on the site.  DO  NOT apply any creams, oils, or ointments to the wound area. - If you notice any drainage or discharge from the wound, any swelling or bruising at the site, or you develop a fever > 101? F after you are discharged home, call the office at once.  Special Instructions - You are still able to use cellular telephones; use the ear opposite the side where you have your pacemaker/defibrillator.  Avoid carrying your cellular phone near your device. - When traveling through airports, show security personnel your identification card to avoid being screened in the metal detectors.  Ask the security personnel to use the hand wand. - Avoid arc welding equipment, MRI testing (magnetic resonance imaging), TENS units (transcutaneous nerve stimulators).  Call the office for questions about other devices. - Avoid electrical appliances that are in poor condition or are not properly grounded. - Microwave ovens are safe to be near or to operate.  Additional information for defibrillator patients should your device go off: - If your device goes off ONCE and you feel fine afterward, notify the device clinic nurses. - If your device goes off ONCE and you do not feel well afterward, call 911. - If your device goes off TWICE, call 911. - If your  device goes off THREE times in one day, call 911.  DO NOT DRIVE YOURSELF OR A FAMILY MEMBER WITH A DEFIBRILLATOR TO THE HOSPITAL--CALL 911.

## 2014-11-11 NOTE — Care Management Important Message (Signed)
Important Message  Patient Details  Name: Thomas Long MRN: [id number] Date of Birth: 1931/02/20   Medicare Important Message Given:  Yes-second notification given    Delorse Lek 11/11/2014, 11:54 AM

## 2014-11-11 NOTE — Progress Notes (Signed)
   SUBJECTIVE: The patient is doing well today.  At this time, he denies chest pain, shortness of breath, or any new concerns.  Marland Kitchen atorvastatin  10 mg Oral Daily  . levETIRAcetam  500 mg Oral Daily  . loratadine  10 mg Oral Daily  . memantine  10 mg Oral BID  . sodium chloride  3 mL Intravenous Q12H      OBJECTIVE: Physical Exam: Filed Vitals:   11/10/14 1440 11/10/14 1500 11/10/14 2100 11/11/14 0500  BP: 129/66 152/85 106/61 131/67  Pulse:  86 89 83  Temp:  97.5 F (36.4 C) 98.1 F (36.7 C) 99.3 F (37.4 C)  TempSrc:  Oral    Resp: 22 20 16 18   Height:      Weight:    198 lb 12.8 oz (90.175 kg)  SpO2:  94% 93% 95%    Intake/Output Summary (Last 24 hours) at 11/11/14 0815 Last data filed at 11/10/14 1456  Gross per 24 hour  Intake    290 ml  Output      0 ml  Net    290 ml    Telemetry reveals sinus rhythm with V pacing   GEN- The patient is well appearing, alert and oriented x 3 today.   Head- normocephalic, atraumatic Eyes-  Sclera clear, conjunctiva pink Ears- hearing intact Oropharynx- clear Neck- supple,  Lungs- Clear to ausculation bilaterally, normal work of breathing Heart- Regular rate and rhythm  GI- soft, NT, ND, + BS Extremities- no clubbing, cyanosis, or edema Skin- pacer site looks good  LABS: Basic Metabolic Panel:  Recent Labs  11/10/14 0334 11/11/14 0436  NA 134* 135  K 4.3 3.9  CL 103 104  CO2 24 25  GLUCOSE 115* 102*  BUN 16 14  CREATININE 0.92 0.95  CALCIUM 8.0* 8.0*   Liver Function Tests: No results for input(s): AST, ALT, ALKPHOS, BILITOT, PROT, ALBUMIN in the last 72 hours. No results for input(s): LIPASE, AMYLASE in the last 72 hours. CBC:  Recent Labs  11/10/14 0334  WBC 8.4  HGB 11.1*  HCT 32.5*  MCV 96.4  PLT 216   RADIOLOGY:  ASSESSMENT AND PLAN:  Principal Problem:   Complete heart block (HCC) Active Problems:   TIA (transient ischemic attack)   CAD in native artery   Hyperlipidemia with target LDL  less than 70  1. Complete heart block Doing well s/p PPM CXR this am reveals stable leads, no ptx Device interrogation is normal  2. Chronic systolic dysfunction Stable No change required today    Discharge to home Routine wound care Wound check in 10 days Return to see me in 3 months  Restart aggrenox in 4 days  [name], MD 11/11/2014 8:15 AM

## 2014-11-11 NOTE — Clinical Documentation Improvement (Signed)
Cardiology  Noted documentation of " chronic systolic  dysfunction"  is dysfunction the same as  "failure" ? If not can the diagnosis of CHF be further specified if appropriate for this admission? Unable to code from documentation of  NYHA classifications or "dysfunction". Thank you     Acuity - Acute, Chronic, Acute on Chronic   Type - Systolic, Diastolic, Systolic and Diastolic  Other  Clinically Undetermined          Please exercise your independent, professional judgment when responding. A specific answer is not anticipated or expected.   Thank You,  [place] [phone]

## 2014-11-11 NOTE — Discharge Summary (Signed)
CARDIOLOGY DISCHARGE SUMMARY   Patient ID: Thomas Long MRN: [id number] DOB/AGE: 1931-07-17 81 y.o.  Admit date: 11/07/2014 Discharge date: 11/11/2014  PCP: Mathews Argyle, MD Electrophysiology: Dr Rayann Heman Primary Cardiologist: Dr Claiborne Billings  Primary Discharge Diagnosis:  Complete heart block - s/p [employer] Jude Medical Quadra Allure MP RF model [id number] (serial number [id number])   Secondary Discharge Diagnosis:    TIA (transient ischemic attack)   CAD in native artery - nonobstructive   Hyperlipidemia with target LDL less than 70  Consults: EP  Procedures: Cardiac catheterization, coronary arteriogram, left ventriculogram, insertion of temporary pacing wire, biventricular St. Jude Medical Quadra Allure MP RF model [id number] (serial Number [id number]), temporary pacer removal, right atrial lead removal, new right atrial lead placement, repositioning of the previously implanted right ventricular lead.  Hospital Course: Ruvim Risko is a 81 y.o. male with a history of nonobstructive CAD based on cath 2011, history of TIA, diabetes, dementia, hypertension, orthostatic hypotension, and history of seizure.   He came to the hospital with intermittent chest pain and feeling lightheaded for several days. He was found to be in complete heart block and was admitted for further evaluation and treatment.  His initial cardiac enzymes were negative. The chest pain was felt possibly secondary to arrhythmia. However, because of his cardiomyopathy, he was taken to the cath lab on 11/08/2014.  Cardiac catheterization results are below. He has a 30% LAD with bridging up to 50%. His EF was 45%.  He was seen by EP and had not been on any rate lowering medications prior to admission. He had been on by systolic in the remote past, but this had previously been discontinued because of orthostatic hypotension. The temporary pacing wire had been placed, with improvement in his symptoms. His chest pain resolved. With his  reduced EF, a biventricular device was felt to be the best option as he will likely paced frequently. He was scheduled for the procedure on 11/08/2014.  He had a [place] MP RF model [id number] (serial number [id number]) biventricular pacemaker inserted without immediate complication.  Unfortunately, on 10/01, the RA lead was noted to be dislodged. Both the V leads had less had slack. He was reprogrammed to VVI with high output to maintain his heart rate. Lead revision was scheduled.  On 11/10/2014, he was taken back to EP lab. His right atrial lead was replaced, and his right ventricular lead was repositioned. After that, his pacemaker was functioning appropriately.  On 11/11/2014, he was seen by Dr. Rayann Heman and all data were reviewed. His postprocedure chest x-ray was stable. His blood pressure was variable and there was concerned about orthostatic hypotension, but his orthostatic vital signs were negative. Systolic blood pressure remained between 101-110 with position changes. No further inpatient workup is indicated and he is considered stable for discharge, to follow up as an outpatient.   Labs:   Lab Results  Component Value Date   WBC 8.4 11/10/2014   HGB 11.1* 11/10/2014   HCT 32.5* 11/10/2014   MCV 96.4 11/10/2014   PLT 216 11/10/2014     Recent Labs Lab 11/11/14 0436  NA 135  K 3.9  CL 104  CO2 25  BUN 14  CREATININE 0.95  CALCIUM 8.0*  GLUCOSE 102*      Radiology: Dg Chest 2 View 11/11/2014   CLINICAL DATA:  Post pacemaker placement, arm soreness.  EXAM: CHEST  2 VIEW  COMPARISON:  11/09/2014.  FINDINGS: Trachea is midline. Heart size normal.  Thoracic aorta is calcified. Left subclavian pacemaker lead tips project over the right atrium, right ventricle and left ventricle, stable. Biapical pleural thickening. Linear scarring or subsegmental atelectasis at the right costophrenic angle. Lungs are otherwise clear. No pneumothorax.  IMPRESSION: Left subclavian  pacemaker placement with stable lead position. No complicating features.   Electronically Signed   By: Lorin Picket M.D.   On: 11/11/2014 07:57   Dg Chest 2 View 11/09/2014   CLINICAL DATA:  Cardiac arrhythmias/pacemaker insertion  EXAM: CHEST  2 VIEW  COMPARISON:  November 07, 2014  FINDINGS: There is no a pacemaker on the left with leads attached to the right atrium, right ventricle, and left ventricle. No pneumothorax. There is no edema or consolidation. The heart size and pulmonary vascularity are normal. No adenopathy. There is atherosclerotic calcification in the aorta.  IMPRESSION: Pacemaker with leads as described. No pneumothorax. No edema or consolidation. No change in cardiac silhouette.   Electronically Signed   By: Lowella Grip III M.D.   On: 11/09/2014 07:05   Dg Chest Port 1 View 11/07/2014   CLINICAL DATA:  Bradycardia  EXAM: PORTABLE CHEST - 1 VIEW  COMPARISON:  05/21/2010  FINDINGS: Cardiac shadow is within normal limits. Aortic calcifications are seen. The right hemidiaphragm is again elevated. No focal infiltrate or sizable effusion is noted.  IMPRESSION: No active disease.   Electronically Signed   By: Inez Catalina M.D.   On: 11/07/2014 20:01    Cardiac Cath: 11/07/2014  Dist LAD lesion, 30% stenosed.  There is mild left ventricular systolic dysfunction.    Insertion of temporary transvenous pacemaker secondary to underlying complete heart block.  No significant coronary obstructive disease with evidence for mild mid systolic bridging of the mid LAD with narrowing up to 50% during systole; normal left circumflex coronary artery; calcification of the ostium of the RCA without significant stenosis in a very large dominant RCA vessel.  Mild global LV dysfunction with an ejection fraction at approximately 45%. There is extensive mitral annular calcification RECOMMENDATION: The patient will be transported to the CCU. Plans will be for him to undergo permanent pacemaker  insertion tomorrow.  PACEMAKER INSERTION: 11/08/2014 CONCLUSIONS:  1. Nonischemic cardiomyopathy with complete heart block and chronic New York Heart Association class III heart failure.  2. Successful biventricular pacemaker implantation.  3. No early apparent complications.  Permanent Pacemaker Indication: Documented non-reversible symptomatic bradycardia due to second degree and/or third degree atrioventricular block.  LEAD REVISION: 11/10/2014 of [place] MP RF model [id number] (serial number [id number]) biventricular pacemaker.   CONCLUSIONS:  1. Successful biventricular pacemaker system revision for atrial lead dislodgement and increased RV lead threshold. A new atrial lead was placed and the RV lead was repositioned. The LV lead was not repositioned today 2. No early apparent complications.    EKG: 11/11/2014 SR, V pacing, rate 92  FOLLOW UP PLANS AND APPOINTMENTS No Known Allergies   Medication List    TAKE these medications        AGGRENOX 200-25 MG 12hr capsule  Generic drug:  dipyridamole-aspirin  TAKE 1 CAPSULE TWICE DAILY  Notes to Patient:  Hold for 48 hours, restart on 11/13/2014.     atorvastatin 10 MG tablet  Commonly known as:  LIPITOR  Take 1 tablet (10 mg total) by mouth daily.     CENTRUM SILVER ADULT 50+ PO  Take 1 capsule by mouth daily.     ketoconazole 2 % shampoo  Commonly known as:  NIZORAL  Apply 1 application topically daily as needed for irritation. AS NEEDED     latanoprost 0.005 % ophthalmic solution  Commonly known as:  XALATAN  Place 1 drop into both eyes at bedtime.     levETIRAcetam 500 MG 24 hr tablet  Commonly known as:  KEPPRA XR  TAKE 1 TABLET BY MOUTH EVERY DAY     loratadine 10 MG tablet  Commonly known as:  CLARITIN  Take 10 mg by mouth daily.     memantine 10 MG tablet  Commonly known as:  NAMENDA  TAKE 1 TABLET TWICE A DAY        Discharge Instructions    Diet - low sodium heart healthy     Complete by:  As directed      Increase activity slowly    Complete by:  As directed           Follow-up Information    Follow up with Truitt Merle, NP On 11/18/2014.   Specialties:  Nurse Practitioner, Interventional Cardiology, Cardiology, Radiology   Why:  See provider at 11:30 am, wound check and device check at noon.    Contact information:   [place] [place] [address] [phone]       BRING ALL MEDICATIONS WITH YOU TO FOLLOW UP APPOINTMENTS  Time spent with patient to include physician time: 48 min Signed: Rosaria Ferries, PA-C 11/11/2014, 11:17 AM Co-Sign MD

## 2014-11-12 ENCOUNTER — Other Ambulatory Visit: Payer: Self-pay | Admitting: Neurology

## 2014-11-12 ENCOUNTER — Telehealth: Payer: Self-pay | Admitting: Cardiovascular Disease

## 2014-11-12 NOTE — Telephone Encounter (Signed)
D/C phone call .Marland Kitchen Appt is on 11/18/14 at 11:30am w/ Truitt Merle at the [facility] office   Thanks

## 2014-11-12 NOTE — Telephone Encounter (Signed)
Patient contacted regarding discharge from [facility] on 11/11/14.  Patient understands to follow up with provider Truitt Merle on 11/18/14 at 11:30 AM at [facility] street . Patient understands discharge instructions? yes  Patient understands medications and regiment? yes  Patient understands to bring all medications to this visit? yes

## 2014-11-14 ENCOUNTER — Telehealth: Payer: Self-pay | Admitting: Internal Medicine

## 2014-11-14 NOTE — Telephone Encounter (Signed)
Did cath from right groin on 9/29 and temp pacer with pacer on 10/30 and Sun had to do lead revisions on 11/11/14.  He is c/o leg pain, mostly at knee and just above knee.  No swelling, no redness, not hot but hurts to touch.  It gets better with Tylenol.  She is going to continue to give him Tylenol and call me back if gets worse tomorrow.  They will keep his appointment 11/18/14

## 2014-11-14 NOTE — Telephone Encounter (Signed)
New problem    Pt is having rt leg pain and need to speak to nurse,.

## 2014-11-15 ENCOUNTER — Encounter (HOSPITAL_COMMUNITY): Payer: Self-pay

## 2014-11-15 ENCOUNTER — Inpatient Hospital Stay (HOSPITAL_COMMUNITY): Payer: Medicare Other | Admitting: Certified Registered Nurse Anesthetist

## 2014-11-15 ENCOUNTER — Telehealth: Payer: Self-pay | Admitting: Internal Medicine

## 2014-11-15 ENCOUNTER — Encounter (HOSPITAL_COMMUNITY)
Admission: EM | Disposition: A | Discharge status: Home Health | Payer: Self-pay | Source: Home / Self Care | Attending: Cardiovascular Disease

## 2014-11-15 ENCOUNTER — Inpatient Hospital Stay (HOSPITAL_COMMUNITY)
Admission: EM | Admit: 2014-11-15 | Discharge: 2014-11-16 | DRG: 253 | Disposition: A | Discharge status: Home Health | Payer: Medicare Other | Attending: Cardiovascular Disease | Admitting: Cardiovascular Disease

## 2014-11-15 ENCOUNTER — Inpatient Hospital Stay (HOSPITAL_COMMUNITY): Payer: Medicare Other

## 2014-11-15 ENCOUNTER — Emergency Department (HOSPITAL_COMMUNITY): Payer: Medicare Other

## 2014-11-15 DIAGNOSIS — T81718A Complication of other artery following a procedure, not elsewhere classified, initial encounter: Secondary | ICD-10-CM | POA: Diagnosis not present

## 2014-11-15 DIAGNOSIS — Z79899 Other long term (current) drug therapy: Secondary | ICD-10-CM

## 2014-11-15 DIAGNOSIS — Y840 Cardiac catheterization as the cause of abnormal reaction of the patient, or of later complication, without mention of misadventure at the time of the procedure: Secondary | ICD-10-CM | POA: Diagnosis present

## 2014-11-15 DIAGNOSIS — T819XXA Unspecified complication of procedure, initial encounter: Secondary | ICD-10-CM | POA: Diagnosis not present

## 2014-11-15 DIAGNOSIS — M79609 Pain in unspecified limb: Secondary | ICD-10-CM | POA: Diagnosis not present

## 2014-11-15 DIAGNOSIS — I998 Other disorder of circulatory system: Secondary | ICD-10-CM

## 2014-11-15 DIAGNOSIS — E785 Hyperlipidemia, unspecified: Secondary | ICD-10-CM | POA: Diagnosis present

## 2014-11-15 DIAGNOSIS — F0390 Unspecified dementia without behavioral disturbance: Secondary | ICD-10-CM | POA: Diagnosis present

## 2014-11-15 DIAGNOSIS — E039 Hypothyroidism, unspecified: Secondary | ICD-10-CM | POA: Diagnosis present

## 2014-11-15 DIAGNOSIS — Z8249 Family history of ischemic heart disease and other diseases of the circulatory system: Secondary | ICD-10-CM | POA: Diagnosis not present

## 2014-11-15 DIAGNOSIS — I442 Atrioventricular block, complete: Secondary | ICD-10-CM | POA: Diagnosis present

## 2014-11-15 DIAGNOSIS — I2510 Atherosclerotic heart disease of native coronary artery without angina pectoris: Secondary | ICD-10-CM | POA: Diagnosis not present

## 2014-11-15 DIAGNOSIS — I110 Hypertensive heart disease with heart failure: Secondary | ICD-10-CM | POA: Diagnosis not present

## 2014-11-15 DIAGNOSIS — R569 Unspecified convulsions: Secondary | ICD-10-CM | POA: Diagnosis present

## 2014-11-15 DIAGNOSIS — Z7902 Long term (current) use of antithrombotics/antiplatelets: Secondary | ICD-10-CM

## 2014-11-15 DIAGNOSIS — I724 Aneurysm of artery of lower extremity: Secondary | ICD-10-CM | POA: Diagnosis present

## 2014-11-15 DIAGNOSIS — M79604 Pain in right leg: Secondary | ICD-10-CM | POA: Diagnosis not present

## 2014-11-15 DIAGNOSIS — Z950 Presence of cardiac pacemaker: Secondary | ICD-10-CM | POA: Diagnosis not present

## 2014-11-15 DIAGNOSIS — Z23 Encounter for immunization: Secondary | ICD-10-CM

## 2014-11-15 DIAGNOSIS — D72829 Elevated white blood cell count, unspecified: Secondary | ICD-10-CM | POA: Diagnosis not present

## 2014-11-15 DIAGNOSIS — D649 Anemia, unspecified: Secondary | ICD-10-CM

## 2014-11-15 DIAGNOSIS — I5022 Chronic systolic (congestive) heart failure: Secondary | ICD-10-CM

## 2014-11-15 DIAGNOSIS — M069 Rheumatoid arthritis, unspecified: Secondary | ICD-10-CM | POA: Diagnosis present

## 2014-11-15 DIAGNOSIS — I10 Essential (primary) hypertension: Secondary | ICD-10-CM | POA: Diagnosis not present

## 2014-11-15 DIAGNOSIS — T8189XA Other complications of procedures, not elsewhere classified, initial encounter: Secondary | ICD-10-CM | POA: Diagnosis not present

## 2014-11-15 DIAGNOSIS — I729 Aneurysm of unspecified site: Secondary | ICD-10-CM

## 2014-11-15 DIAGNOSIS — Z8673 Personal history of transient ischemic attack (TIA), and cerebral infarction without residual deficits: Secondary | ICD-10-CM

## 2014-11-15 DIAGNOSIS — G8929 Other chronic pain: Secondary | ICD-10-CM | POA: Diagnosis not present

## 2014-11-15 DIAGNOSIS — I428 Other cardiomyopathies: Secondary | ICD-10-CM | POA: Diagnosis present

## 2014-11-15 DIAGNOSIS — M79661 Pain in right lower leg: Secondary | ICD-10-CM | POA: Diagnosis not present

## 2014-11-15 DIAGNOSIS — R52 Pain, unspecified: Secondary | ICD-10-CM

## 2014-11-15 HISTORY — PX: FALSE ANEURYSM REPAIR: SHX5152

## 2014-11-15 HISTORY — DX: Atherosclerotic heart disease of native coronary artery without angina pectoris: I25.10

## 2014-11-15 HISTORY — DX: Orthostatic hypotension: I95.1

## 2014-11-15 HISTORY — DX: Hyperlipidemia, unspecified: E78.5

## 2014-11-15 HISTORY — DX: Other cardiomyopathies: I42.8

## 2014-11-15 LAB — BASIC METABOLIC PANEL
Anion gap: 9 (ref 5–15)
BUN: 16 mg/dL (ref 6–20)
CO2: 27 mmol/L (ref 22–32)
Calcium: 8.9 mg/dL (ref 8.9–10.3)
Chloride: 101 mmol/L (ref 101–111)
Creatinine, Ser: 0.9 mg/dL (ref 0.61–1.24)
GFR calc Af Amer: >60 mL/min (ref >60)
GFR calc non Af Amer: >60 mL/min (ref >60)
Glucose, Bld: 142 mg/dL — ABNORMAL HIGH (ref 65–99)
Potassium: 4.2 mmol/L (ref 3.5–5.1)
Sodium: 137 mmol/L (ref 135–145)

## 2014-11-15 LAB — CBC WITH DIFFERENTIAL/PLATELET
Basophils Absolute: 0 10*3/uL (ref 0.0–0.1)
Basophils Relative: 0 %
Eosinophils Absolute: 0 10*3/uL (ref 0.0–0.7)
Eosinophils Relative: 0 %
HCT: 34.9 % — ABNORMAL LOW (ref 39.0–52.0)
Hemoglobin: 11.6 g/dL — ABNORMAL LOW (ref 13.0–17.0)
Lymphocytes Relative: 3 %
Lymphs Abs: 0.4 10*3/uL — ABNORMAL LOW (ref 0.7–4.0)
MCH: 32.5 pg (ref 26.0–34.0)
MCHC: 33.2 g/dL (ref 30.0–36.0)
MCV: 97.8 fL (ref 78.0–100.0)
Monocytes Absolute: 0.5 10*3/uL (ref 0.1–1.0)
Monocytes Relative: 4 %
Neutro Abs: 12.3 10*3/uL — ABNORMAL HIGH (ref 1.7–7.7)
Neutrophils Relative %: 93 %
Platelets: 320 10*3/uL (ref 150–400)
RBC: 3.57 MIL/uL — ABNORMAL LOW (ref 4.22–5.81)
RDW: 12.8 % (ref 11.5–15.5)
WBC: 13.2 10*3/uL — ABNORMAL HIGH (ref 4.0–10.5)

## 2014-11-15 LAB — APTT: aPTT: 32 seconds (ref 24–37)

## 2014-11-15 LAB — PROTIME-INR
INR: 1.19 (ref 0.00–1.49)
Prothrombin Time: 15.2 seconds (ref 11.6–15.2)

## 2014-11-15 SURGERY — REPAIR, PSEUDOANEURYSM
Anesthesia: General | Site: Groin | Laterality: Right

## 2014-11-15 MED ORDER — ONDANSETRON HCL 4 MG/2ML IJ SOLN: 4.0000 mg | Freq: Four times a day (QID) | INTRAMUSCULAR | Status: DC | PRN

## 2014-11-15 MED ORDER — HYDRALAZINE HCL 20 MG/ML IJ SOLN: 5.0000 mg | INTRAMUSCULAR | Status: DC | PRN

## 2014-11-15 MED ORDER — METOPROLOL TARTRATE 1 MG/ML IV SOLN: 2.0000 mg | INTRAVENOUS | Status: DC | PRN

## 2014-11-15 MED ORDER — SENNOSIDES-DOCUSATE SODIUM 8.6-50 MG PO TABS
1.0000 | ORAL_TABLET | Freq: Every evening | ORAL | Status: DC | PRN
  Filled 2014-11-15: qty 1

## 2014-11-15 MED ORDER — LIDOCAINE HCL (CARDIAC) 20 MG/ML IV SOLN
INTRAVENOUS | Status: AC
  Filled 2014-11-15: qty 5

## 2014-11-15 MED ORDER — POTASSIUM CHLORIDE CRYS ER 20 MEQ PO TBCR: 20.0000 meq | EXTENDED_RELEASE_TABLET | Freq: Every day | ORAL | Status: DC | PRN

## 2014-11-15 MED ORDER — SUCCINYLCHOLINE CHLORIDE 20 MG/ML IJ SOLN
INTRAMUSCULAR | Status: AC
  Filled 2014-11-15: qty 1

## 2014-11-15 MED ORDER — LABETALOL HCL 5 MG/ML IV SOLN: 10.0000 mg | INTRAVENOUS | Status: DC | PRN

## 2014-11-15 MED ORDER — PROPOFOL 10 MG/ML IV BOLUS
INTRAVENOUS | Status: AC
  Filled 2014-11-15: qty 20

## 2014-11-15 MED ORDER — THROMBIN 5000 UNITS EX SOLR
Freq: Once | INTRAMUSCULAR | Status: DC
  Filled 2014-11-15 (×3): qty 1

## 2014-11-15 MED ORDER — FENTANYL CITRATE (PF) 250 MCG/5ML IJ SOLN
INTRAMUSCULAR | Status: AC
  Filled 2014-11-15: qty 5

## 2014-11-15 MED ORDER — ENOXAPARIN SODIUM 40 MG/0.4ML ~~LOC~~ SOLN
40.0000 mg | SUBCUTANEOUS | Status: DC
  Administered 2014-11-16: 40 mg via SUBCUTANEOUS
  Filled 2014-11-15 (×2): qty 0.4

## 2014-11-15 MED ORDER — DOCUSATE SODIUM 100 MG PO CAPS
100.0000 mg | ORAL_CAPSULE | Freq: Every day | ORAL | Status: DC
  Administered 2014-11-16: 100 mg via ORAL
  Filled 2014-11-15: qty 1

## 2014-11-15 MED ORDER — LACTATED RINGERS IV SOLN
INTRAVENOUS | Status: DC
  Administered 2014-11-15 (×2): via INTRAVENOUS

## 2014-11-15 MED ORDER — ALUM & MAG HYDROXIDE-SIMETH 200-200-20 MG/5ML PO SUSP: 15.0000 mL | ORAL | Status: DC | PRN

## 2014-11-15 MED ORDER — PHENYLEPHRINE HCL 10 MG/ML IJ SOLN
10.0000 mg | INTRAVENOUS | Status: DC | PRN
  Administered 2014-11-15: 25 ug/min via INTRAVENOUS

## 2014-11-15 MED ORDER — ATORVASTATIN CALCIUM 10 MG PO TABS
10.0000 mg | ORAL_TABLET | Freq: Every day | ORAL | Status: DC
  Administered 2014-11-16: 10 mg via ORAL
  Filled 2014-11-15: qty 1

## 2014-11-15 MED ORDER — SUGAMMADEX SODIUM 200 MG/2ML IV SOLN
INTRAVENOUS | Status: DC | PRN
  Administered 2014-11-15: 200 mg via INTRAVENOUS

## 2014-11-15 MED ORDER — MORPHINE SULFATE (PF) 2 MG/ML IV SOLN
2.0000 mg | INTRAVENOUS | Status: DC | PRN
Start: 2014-11-15 — End: 2014-11-16
  Administered 2014-11-15 (×3): 2 mg via INTRAVENOUS
  Filled 2014-11-15 (×4): qty 1

## 2014-11-15 MED ORDER — HYDROMORPHONE HCL 1 MG/ML IJ SOLN
0.2500 mg | INTRAMUSCULAR | Status: DC | PRN
  Administered 2014-11-15: 0.5 mg via INTRAVENOUS

## 2014-11-15 MED ORDER — GUAIFENESIN-DM 100-10 MG/5ML PO SYRP: 15.0000 mL | ORAL_SOLUTION | ORAL | Status: DC | PRN

## 2014-11-15 MED ORDER — OXYCODONE-ACETAMINOPHEN 5-325 MG PO TABS: 1.0000 | ORAL_TABLET | ORAL | Status: DC | PRN

## 2014-11-15 MED ORDER — PHENOL 1.4 % MT LIQD: 1.0000 | OROMUCOSAL | Status: DC | PRN

## 2014-11-15 MED ORDER — ONDANSETRON HCL 4 MG/2ML IJ SOLN
INTRAMUSCULAR | Status: AC
  Filled 2014-11-15: qty 2

## 2014-11-15 MED ORDER — PROPOFOL 10 MG/ML IV BOLUS
INTRAVENOUS | Status: DC | PRN
  Administered 2014-11-15: 120 mg via INTRAVENOUS

## 2014-11-15 MED ORDER — LIDOCAINE HCL (CARDIAC) 20 MG/ML IV SOLN
INTRAVENOUS | Status: AC
  Filled 2014-11-15: qty 10

## 2014-11-15 MED ORDER — ROCURONIUM BROMIDE 100 MG/10ML IV SOLN
INTRAVENOUS | Status: DC | PRN
  Administered 2014-11-15: 30 mg via INTRAVENOUS

## 2014-11-15 MED ORDER — BISACODYL 5 MG PO TBEC: 5.0000 mg | DELAYED_RELEASE_TABLET | Freq: Every day | ORAL | Status: DC | PRN

## 2014-11-15 MED ORDER — SODIUM CHLORIDE 0.9 % IV SOLN
INTRAVENOUS | Status: DC
  Administered 2014-11-15: 21:00:00 via INTRAVENOUS

## 2014-11-15 MED ORDER — ACETAMINOPHEN 650 MG RE SUPP: 650.0000 mg | Freq: Four times a day (QID) | RECTAL | Status: DC | PRN

## 2014-11-15 MED ORDER — PROMETHAZINE HCL 25 MG/ML IJ SOLN: 6.2500 mg | INTRAMUSCULAR | Status: DC | PRN

## 2014-11-15 MED ORDER — ONDANSETRON HCL 4 MG/2ML IJ SOLN
4.0000 mg | Freq: Once | INTRAMUSCULAR | Status: AC
  Administered 2014-11-15: 4 mg via INTRAVENOUS
  Filled 2014-11-15: qty 2

## 2014-11-15 MED ORDER — LORATADINE 10 MG PO TABS
10.0000 mg | ORAL_TABLET | Freq: Every day | ORAL | Status: DC
  Administered 2014-11-16: 10 mg via ORAL
  Filled 2014-11-15: qty 1

## 2014-11-15 MED ORDER — SUGAMMADEX SODIUM 200 MG/2ML IV SOLN
INTRAVENOUS | Status: AC
  Filled 2014-11-15: qty 2

## 2014-11-15 MED ORDER — GLYCOPYRROLATE 0.2 MG/ML IJ SOLN
INTRAMUSCULAR | Status: AC
  Filled 2014-11-15: qty 1

## 2014-11-15 MED ORDER — SUCCINYLCHOLINE CHLORIDE 20 MG/ML IJ SOLN
INTRAMUSCULAR | Status: DC | PRN
  Administered 2014-11-15: 100 mg via INTRAVENOUS

## 2014-11-15 MED ORDER — ACETAMINOPHEN 325 MG PO TABS: 650.0000 mg | ORAL_TABLET | Freq: Four times a day (QID) | ORAL | Status: DC | PRN

## 2014-11-15 MED ORDER — LIDOCAINE HCL (CARDIAC) 20 MG/ML IV SOLN
INTRAVENOUS | Status: DC | PRN
  Administered 2014-11-15: 80 mg via INTRAVENOUS

## 2014-11-15 MED ORDER — PROTAMINE SULFATE 10 MG/ML IV SOLN
INTRAVENOUS | Status: DC | PRN
  Administered 2014-11-15: 50 mg via INTRAVENOUS

## 2014-11-15 MED ORDER — CEFAZOLIN SODIUM-DEXTROSE 2-3 GM-% IV SOLR
INTRAVENOUS | Status: DC | PRN
  Administered 2014-11-15: 2 g via INTRAVENOUS

## 2014-11-15 MED ORDER — LIDOCAINE HCL 1 % IJ SOLN
5.0000 mL | Freq: Once | INTRAMUSCULAR | Status: DC
  Filled 2014-11-15: qty 5

## 2014-11-15 MED ORDER — PANTOPRAZOLE SODIUM 40 MG PO TBEC
40.0000 mg | DELAYED_RELEASE_TABLET | Freq: Every day | ORAL | Status: DC
  Administered 2014-11-15 – 2014-11-16 (×2): 40 mg via ORAL
  Filled 2014-11-15 (×2): qty 1

## 2014-11-15 MED ORDER — LEVETIRACETAM ER 500 MG PO TB24
500.0000 mg | ORAL_TABLET | Freq: Every day | ORAL | Status: DC
  Administered 2014-11-16: 500 mg via ORAL
  Filled 2014-11-15 (×2): qty 1

## 2014-11-15 MED ORDER — ASPIRIN-DIPYRIDAMOLE ER 25-200 MG PO CP12
1.0000 | ORAL_CAPSULE | Freq: Two times a day (BID) | ORAL | Status: DC
  Administered 2014-11-15 – 2014-11-16 (×2): 1 via ORAL
  Filled 2014-11-15 (×4): qty 1

## 2014-11-15 MED ORDER — HYDROMORPHONE HCL 1 MG/ML IJ SOLN
INTRAMUSCULAR | Status: AC
  Filled 2014-11-15: qty 1

## 2014-11-15 MED ORDER — LATANOPROST 0.005 % OP SOLN
1.0000 [drp] | Freq: Every day | OPHTHALMIC | Status: DC
  Administered 2014-11-15: 1 [drp] via OPHTHALMIC
  Filled 2014-11-15 (×2): qty 2.5

## 2014-11-15 MED ORDER — HEPARIN SODIUM (PORCINE) 5000 UNIT/ML IJ SOLN
INTRAMUSCULAR | Status: DC | PRN
  Administered 2014-11-15: 500 mL

## 2014-11-15 MED ORDER — MAGNESIUM SULFATE 2 GM/50ML IV SOLN: 2.0000 g | Freq: Every day | INTRAVENOUS | Status: DC | PRN

## 2014-11-15 MED ORDER — ROCURONIUM BROMIDE 50 MG/5ML IV SOLN
INTRAVENOUS | Status: AC
  Filled 2014-11-15: qty 1

## 2014-11-15 MED ORDER — SODIUM CHLORIDE 0.9 % IV SOLN
500.0000 mL | Freq: Once | INTRAVENOUS | Status: AC | PRN
  Administered 2014-11-15: 500 mL via INTRAVENOUS

## 2014-11-15 MED ORDER — FENTANYL CITRATE (PF) 100 MCG/2ML IJ SOLN
INTRAMUSCULAR | Status: DC | PRN
  Administered 2014-11-15: 50 ug via INTRAVENOUS

## 2014-11-15 MED ORDER — HYDROCODONE-ACETAMINOPHEN 5-325 MG PO TABS: 1.0000 | ORAL_TABLET | ORAL | Status: DC | PRN

## 2014-11-15 MED ORDER — DEXTROSE 5 % IV SOLN
1.5000 g | Freq: Two times a day (BID) | INTRAVENOUS | Status: DC
  Filled 2014-11-15: qty 1.5

## 2014-11-15 MED ORDER — MEMANTINE HCL 10 MG PO TABS
10.0000 mg | ORAL_TABLET | Freq: Two times a day (BID) | ORAL | Status: DC
  Administered 2014-11-15 – 2014-11-16 (×2): 10 mg via ORAL
  Filled 2014-11-15 (×3): qty 1

## 2014-11-15 MED ORDER — HEPARIN SODIUM (PORCINE) 1000 UNIT/ML IJ SOLN
INTRAMUSCULAR | Status: DC | PRN
  Administered 2014-11-15: 7000 [IU] via INTRAVENOUS

## 2014-11-15 MED ORDER — THROMBIN 5000 UNITS EX SOLR
Freq: Once | CUTANEOUS | Status: DC
  Filled 2014-11-15: qty 5000

## 2014-11-15 MED ORDER — INFLUENZA VAC SPLIT QUAD 0.5 ML IM SUSY
0.5000 mL | PREFILLED_SYRINGE | INTRAMUSCULAR | Status: AC
  Administered 2014-11-16: 0.5 mL via INTRAMUSCULAR
  Filled 2014-11-15: qty 0.5

## 2014-11-15 MED ORDER — OXYCODONE-ACETAMINOPHEN 5-325 MG PO TABS
1.0000 | ORAL_TABLET | Freq: Once | ORAL | Status: AC
  Administered 2014-11-15: 1 via ORAL
  Filled 2014-11-15: qty 1

## 2014-11-15 MED ORDER — 0.9 % SODIUM CHLORIDE (POUR BTL) OPTIME
TOPICAL | Status: DC | PRN
  Administered 2014-11-15: 1000 mL

## 2014-11-15 MED ORDER — PHENYLEPHRINE HCL 10 MG/ML IJ SOLN
INTRAMUSCULAR | Status: DC | PRN
  Administered 2014-11-15: 80 ug via INTRAVENOUS
  Administered 2014-11-15: 120 ug via INTRAVENOUS
  Administered 2014-11-15 (×2): 80 ug via INTRAVENOUS
  Administered 2014-11-15: 120 ug via INTRAVENOUS

## 2014-11-15 SURGICAL SUPPLY — 46 items
APL SKNCLS STERI-STRIP NONHPOA (GAUZE/BANDAGES/DRESSINGS) ×1
BANDAGE ESMARK 6X9 LF (GAUZE/BANDAGES/DRESSINGS) IMPLANT
BENZOIN TINCTURE PRP APPL 2/3 (GAUZE/BANDAGES/DRESSINGS) ×2 IMPLANT
BNDG CMPR 9X6 STRL LF SNTH (GAUZE/BANDAGES/DRESSINGS)
BNDG ESMARK 6X9 LF (GAUZE/BANDAGES/DRESSINGS)
CANISTER SUCTION 2500CC (MISCELLANEOUS) ×2 IMPLANT
CANNULA VESSEL 3MM 2 BLNT TIP (CANNULA) IMPLANT
CLIP LIGATING EXTRA MED SLVR (CLIP) ×2 IMPLANT
CLIP LIGATING EXTRA SM BLUE (MISCELLANEOUS) ×2 IMPLANT
CLSR STERI-STRIP ANTIMIC 1/2X4 (GAUZE/BANDAGES/DRESSINGS) ×1 IMPLANT
CUFF TOURNIQUET SINGLE 18IN (TOURNIQUET CUFF) IMPLANT
CUFF TOURNIQUET SINGLE 24IN (TOURNIQUET CUFF) IMPLANT
CUFF TOURNIQUET SINGLE 34IN LL (TOURNIQUET CUFF) IMPLANT
CUFF TOURNIQUET SINGLE 44IN (TOURNIQUET CUFF) IMPLANT
DRAIN SNY 10X20 3/4 PERF (WOUND CARE) IMPLANT
DRAPE X-RAY CASS 24X20 (DRAPES) IMPLANT
DRSG COVADERM 4X8 (GAUZE/BANDAGES/DRESSINGS) IMPLANT
ELECT REM PT RETURN 9FT ADLT (ELECTROSURGICAL) ×2
ELECTRODE REM PT RTRN 9FT ADLT (ELECTROSURGICAL) ×1 IMPLANT
EVACUATOR SILICONE 100CC (DRAIN) IMPLANT
GAUZE SPONGE 4X4 12PLY STRL (GAUZE/BANDAGES/DRESSINGS) ×1 IMPLANT
GLOVE SS BIOGEL STRL SZ 7.5 (GLOVE) ×1 IMPLANT
GLOVE SUPERSENSE BIOGEL SZ 7.5 (GLOVE) ×1
GOWN STRL REUS W/ TWL LRG LVL3 (GOWN DISPOSABLE) ×3 IMPLANT
GOWN STRL REUS W/TWL LRG LVL3 (GOWN DISPOSABLE) ×6
KIT BASIN OR (CUSTOM PROCEDURE TRAY) ×2 IMPLANT
KIT ROOM TURNOVER OR (KITS) ×2 IMPLANT
NS IRRIG 1000ML POUR BTL (IV SOLUTION) ×3 IMPLANT
PACK PERIPHERAL VASCULAR (CUSTOM PROCEDURE TRAY) ×2 IMPLANT
PAD ARMBOARD 7.5X6 YLW CONV (MISCELLANEOUS) ×4 IMPLANT
PADDING CAST COTTON 6X4 STRL (CAST SUPPLIES) IMPLANT
SET COLLECT BLD 21X3/4 12 (NEEDLE) IMPLANT
STAPLER VISISTAT 35W (STAPLE) IMPLANT
STOPCOCK 4 WAY LG BORE MALE ST (IV SETS) IMPLANT
STRIP CLOSURE SKIN 1/2X4 (GAUZE/BANDAGES/DRESSINGS) ×2 IMPLANT
SUT ETHILON 3 0 PS 1 (SUTURE) IMPLANT
SUT PROLENE 5 0 C 1 24 (SUTURE) ×1 IMPLANT
SUT PROLENE 6 0 CC (SUTURE) IMPLANT
SUT VIC AB 2-0 CTX 36 (SUTURE) ×2 IMPLANT
SUT VIC AB 3-0 SH 27 (SUTURE) ×2
SUT VIC AB 3-0 SH 27X BRD (SUTURE) ×1 IMPLANT
TAPE CLOTH SURG 4X10 WHT LF (GAUZE/BANDAGES/DRESSINGS) ×1 IMPLANT
TRAY FOLEY W/METER SILVER 16FR (SET/KITS/TRAYS/PACK) ×1 IMPLANT
TUBING EXTENTION W/L.L. (IV SETS) IMPLANT
UNDERPAD 30X30 INCONTINENT (UNDERPADS AND DIAPERS) ×2 IMPLANT
WATER STERILE IRR 1000ML POUR (IV SOLUTION) ×2 IMPLANT

## 2014-11-15 NOTE — ED Notes (Signed)
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 Pt is stable and is resting in bed at this time.

## 2014-11-15 NOTE — ED Notes (Signed)
Spoke to Dr. Christy Gentles, plan for patient to stay for venous study in morning.

## 2014-11-15 NOTE — Consult Note (Signed)
Patient name: Thomas Long MRN: [id number] DOB: Jun 07, 1931 Sex: male     Reason for referral:  Chief Complaint  Patient presents with  . Leg Pain    HISTORY OF PRESENT ILLNESS: The patient was seen for evaluation of right femoral false aneurysm. He is status post right femoral access for catheterization and also had the pacemaker placement. He is on anticoagulation and had been doing well at home. Yesterday evening began having right groin pain and this morning had more pain in his entire right leg with numbness on the anterior surface and difficulty placing weight on his right leg. He presented to the emergency department underwent duplex which showed a 5 cm false aneurysm. He had attempted that the compression of this which was unsuccessful. He is seen in surgical consultation for repair he has no history of peripheral vascular occlusive disease   Past Medical History  Diagnosis Date  . Depression   . Hyperlipidemia   . Hypertension   . Chronic pain   . TIA (transient ischemic attack)   . Presence of permanent cardiac pacemaker   . Facial cellulitis 02/19/2009    "related to [facility] & having skin cancer zapped; took him off the Embrel"  . Sleep apnea     "gone since losing weight"  . Hypothyroidism   . Seizures ([place]) 2011    "vs stroke; never determined which it was; on sz RX since"  (11/08/2014)  . Stroke [facility]) 20111    "vs seizure; never determined which it was; on sz RX since"  (11/08/2014)  . Rheumatoid arthritis ([place])     "hands mainly" (11/08/2014)  . Dementia     "memory lapses q now and then" (11/08/2014)  . Complete heart block ([place]) 11/07/2014    a. s/p [facility] Quadra Allure MP RF model [id number] (serial number [id number]) biventricular pacemaker 10/2014.  Marland Kitchen CAD (coronary artery disease)     a. LHC 10/2014: nonobstructive disease, 30% LAD with bridging up to 50%.  Marland Kitchen NICM (nonischemic cardiomyopathy) ([place])     a. 10/2014: EF 45%. (Prev 40-45% by echo 08/2014).  .  Orthostatic hypotension     Past Surgical History  Procedure Laterality Date  . Bi-ventricular pacemaker insertion (crt-p)  11/08/2014  . Tonsillectomy    . Cholecystectomy open  1980's  . Exploratory laparotomy  1990's    "put intestines back in & added mesh"  . Appendectomy  ~ 1988  . Cataract extraction w/ intraocular lens  implant, bilateral Bilateral [place] 2000's  . Knee cartilage surgery Left 1990's    "opened it up"  . Cardiac catheterization  04/01/09    which showed low normal EF at 50% with question of underlying borderline area of minimal inferoapical hypercontractility. He had mild coronary obstructive disease with coronary calcification and segmental 20% narrowing in the LAD proximally, 20% in the mid LAD with muscle bridging. There was calcification at the ostium of the RCA, and 20 to 30%narrowing of the proximal to mid RCA with 30% distal n  . Cardiac catheterization N/A 11/07/2014    Procedure: Left Heart Cath and Coronary Angiography/ temp wire;  Surgeon: [name], MD;  Location: [place] [facility];  Service: Cardiovascular;  Laterality: N/A;  . Ep implantable device N/A 11/08/2014    Procedure: Pacemaker Implant;  Surgeon: [name], MD; [facility] MP RF model [id number] (serial number [id number]) pacemaker; Laterality: Left  . Ep implantable device N/A 11/10/2014  Procedure: Lead Revision/Repair;  Surgeon: [name], MD;     Social History   Social History  . Marital Status: Married    Spouse Name: Thomas Long  . Number of Children: 5  . Years of Education: College   Occupational History  . Not on file.   Social History Main Topics  . Smoking status: Never Smoker   . Smokeless tobacco: Never Used  . Alcohol Use: No  . Drug Use: No  . Sexual Activity: Not on file   Other Topics Concern  . Not on file   Social History Narrative   Patient is married Thomas Long) and lives at home with wife and two children.   Patient has five adult children.     Patient is retired.   Patient has a college education.   Patient is right-handed.   Patient drinks two cups of coffee daily, 1/2 can of soda daily and tea- 2-3 times per week.    Family History  Problem Relation Age of Onset  . Heart attack Father   . Cancer - Lung Sister   . Thyroid disease Child     Allergies as of 11/15/2014  . (No Known Allergies)    No current facility-administered medications on file prior to encounter.   Current Outpatient Prescriptions on File Prior to Encounter  Medication Sig Dispense Refill  . AGGRENOX 25-200 MG per 12 hr capsule TAKE 1 CAPSULE TWICE DAILY 180 capsule 3  . atorvastatin (LIPITOR) 10 MG tablet Take 1 tablet (10 mg total) by mouth daily. 90 tablet 2  . ketoconazole (NIZORAL) 2 % shampoo Apply 1 application topically daily as needed for irritation. AS NEEDED  5  . latanoprost (XALATAN) 0.005 % ophthalmic solution Place 1 drop into both eyes at bedtime.   3  . levETIRAcetam (KEPPRA XR) 500 MG 24 hr tablet TAKE 1 TABLET BY MOUTH EVERY DAY 90 tablet 1  . loratadine (CLARITIN) 10 MG tablet Take 10 mg by mouth daily.    . memantine (NAMENDA) 10 MG tablet TAKE 1 TABLET TWICE A DAY 180 tablet 0  . Multiple Vitamins-Minerals (CENTRUM SILVER ADULT 50+ PO) Take 1 capsule by mouth daily.       REVIEW OF SYSTEMS:  Reviewed in his chart with nothing to add  PHYSICAL EXAMINATION:  General: The patient is a well-nourished male, in no acute distress. Vital signs are BP 134/77 mmHg  Pulse 95  Temp(Src) 98.9 F (37.2 C) (Rectal)  Resp 11  Ht 5\' 9"  (1.753 m)  Wt 206 lb 12.7 oz (93.8 kg)  BMI 30.52 kg/m2  SpO2 98% Pulmonary: There is a good air exchange Abdomen: Soft and non-tenderwith no masses noted  Musculoskeletal: There are no major deformities.  There is no significant extremity pain. Neurologic: No focal weakness or paresthesias are detected, Skin: There are no ulcer or rashes noted. Psychiatric: The patient has normal  affect. Cardiovascular:2+ posterior tibial pulses bilaterally Right groin is tender to the touch with prominent ulceration.    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 No evidence of arterial insufficiency. Will take emergently to the operating room for exploration and repair of acute false aneurysm   Adolf [facility]: [phone]

## 2014-11-15 NOTE — Progress Notes (Signed)
Preliminary results by tech - Right groin pseudo aneurysm compression was unsuccessful.  Oda Cogan, BS, RDMS, RVT

## 2014-11-15 NOTE — ED Notes (Addendum)
Cardiac PA along with RN at bedside while decompression is taking place. Pt on cardiac monitor and 2L oxygen.

## 2014-11-15 NOTE — H&P (Addendum)
History and Physical  Patient ID: Thomas Long MRN: [id number], DOB: 08-Apr-1931 Date of Encounter: 11/15/2014, 11:29 AM Primary Physician: Mathews Argyle, MD Electrophysiology: Dr Rayann Heman Primary Cardiologist: Dr Claiborne Billings  Chief Complaint: right groin pain post-cath Reason for Admission: right groin pseudoaneurysm  HPI: Thomas Long is an 81 y/o M with history of recent CHB s/p BiV-PPM 10/2014, NICM EF 45% (LHC 10/2014, EF 40-45% by echo 08/2014), nonobstructive CAD by cath 10/2014, HTN w/ history of orthostasis, HLD, prior TIA, prior stroke vs seizure, hypothyroidism, dementia who presented to [facility] overnight with right leg pain. He has had recently chronic right knee pain but this was different. He had difficulty moving the leg. He was found to have a right groin pseudoaneurysm measuring approx 4.8 x 3.5 x 2.9 cm. There was no evidence of DVT. Dr. Sallyanne Kuster evaluated the patient and recommended admission and attempt at compression. Pseudoaneurysm compression was attempted for over 20 minutes but was unsuccessful with little change in size. The patient had significant pain despite 6mg  of morphine. Dr. Sallyanne Kuster spoke with Dr. Burt Knack who reviewed films with Dr. Oneida Alar. Per that discussion, Dr. Burt Knack plans to attempt thrombin injection this afternoon. He otherwise denies CP, SOB, orthopnea, edema. Vitals are notable for mild sinus tach, stable BP, normal pulse ox. Leg plain film showed no evidence of fracture or dislocation. Labwork is notable for leukocytosis WBC 13.2, Hgb 11.6 (stable from 11.1 at discharge). No localizing infective symptoms.  Past Medical History  Diagnosis Date  . Depression   . Hyperlipidemia   . Hypertension   . Chronic pain   . TIA (transient ischemic attack)   . Presence of permanent cardiac pacemaker   . Facial cellulitis 02/19/2009    "related to [facility] & having skin cancer zapped; took him off the Embrel"  . Sleep apnea     "gone since losing weight"  .  Hypothyroidism   . Seizures ([place]) 2011    "vs stroke; never determined which it was; on sz RX since"  (11/08/2014)  . Stroke [facility]) [address]11    "vs seizure; never determined which it was; on sz RX since"  (11/08/2014)  . Rheumatoid arthritis ([place])     "hands mainly" (11/08/2014)  . Dementia     "memory lapses q now and then" (11/08/2014)  . Complete heart block ([place]) 11/07/2014    a. s/p [facility] Quadra Allure MP RF model [id number] (serial number [id number]) biventricular pacemaker 10/2014.  Marland Kitchen CAD (coronary artery disease)     a. LHC 10/2014: nonobstructive disease, 30% LAD with bridging up to 50%.  Marland Kitchen NICM (nonischemic cardiomyopathy) ([place])     a. 10/2014: EF 45%. (Prev 40-45% by echo 08/2014).  . Orthostatic hypotension      Most Recent Cardiac Studies:    Surgical History:  Past Surgical History  Procedure Laterality Date  . Bi-ventricular pacemaker insertion (crt-p)  11/08/2014  . Tonsillectomy    . Cholecystectomy open  1980's  . Exploratory laparotomy  1990's    "put intestines back in & added mesh"  . Appendectomy  ~ 1988  . Cataract extraction w/ intraocular lens  implant, bilateral Bilateral early 2000's  . Knee cartilage surgery Left 1990's    "opened it up"  . Cardiac catheterization  04/01/09    which showed low normal EF at 50% with question of underlying borderline area of minimal inferoapical hypercontractility. He had mild coronary obstructive disease with coronary calcification and segmental 20% narrowing in the LAD proximally, 20%  in the mid LAD with muscle bridging. There was calcification at the ostium of the RCA, and 20 to 30%narrowing of the proximal to mid RCA with 30% distal n  . Cardiac catheterization N/A 11/07/2014    Procedure: Left Heart Cath and Coronary Angiography/ temp wire;  Surgeon: [name], MD;  Location: [place] [facility];  Service: Cardiovascular;  Laterality: N/A;  . Ep implantable device N/A 11/08/2014    Procedure: Pacemaker Implant;  Surgeon:  [name], MD; [facility] MP RF model [id number] (serial number [id number]) pacemaker; Laterality: Left  . Ep implantable device N/A 11/10/2014    Procedure: Lead Revision/Repair;  Surgeon: [name], MD;      Home Meds: Prior to Admission medications   Medication Sig Start Date End Date Taking? Authorizing Provider  AGGRENOX 25-200 MG per 12 hr capsule TAKE 1 CAPSULE TWICE DAILY 01/28/14  Yes Larey Seat, MD  atorvastatin (LIPITOR) 10 MG tablet Take 1 tablet (10 mg total) by mouth daily. 10/03/14  Yes [name], MD  ketoconazole (NIZORAL) 2 % shampoo Apply 1 application topically daily as needed for irritation. AS NEEDED 06/12/14  Yes Historical Provider, MD  latanoprost (XALATAN) 0.005 % ophthalmic solution Place 1 drop into both eyes at bedtime.  04/12/14  Yes Historical Provider, MD  levETIRAcetam (KEPPRA XR) 500 MG 24 hr tablet TAKE 1 TABLET BY MOUTH EVERY DAY 09/29/14  Yes Larey Seat, MD  loratadine (CLARITIN) 10 MG tablet Take 10 mg by mouth daily.   Yes Historical Provider, MD  memantine (NAMENDA) 10 MG tablet TAKE 1 TABLET TWICE A DAY 11/13/14  Yes Larey Seat, MD  Multiple Vitamins-Minerals (CENTRUM SILVER ADULT 50+ PO) Take 1 capsule by mouth daily.   Yes Historical Provider, MD    Allergies: No Known Allergies  Social History   Social History  . Marital Status: Married    Spouse Name: [name]  . Number of Children: 5  . Years of Education: College   Occupational History  . Not on file.   Social History Main Topics  . Smoking status: Never Smoker   . Smokeless tobacco: Never Used  . Alcohol Use: No  . Drug Use: No  . Sexual Activity: Not on file   Other Topics Concern  . Not on file   Social History Narrative   Patient is married [name]) and lives at home with wife and two children.   Patient has five adult children.   Patient is retired.   Patient has a college education.   Patient is right-handed.   Patient drinks two cups of  coffee daily, 1/2 can of soda daily and tea- 2-3 times per week.     Family History  Problem Relation Age of Onset  . Heart attack Father   . Cancer - Lung Sister   . Thyroid disease Child     Review of Systems: No fever or chills. No abd pain. All other systems reviewed and are otherwise negative except as noted above.  Labs:   Lab Results  Component Value Date   WBC 13.2* 11/15/2014   HGB 11.6* 11/15/2014   HCT 34.9* 11/15/2014   MCV 97.8 11/15/2014   PLT 320 11/15/2014    Recent Labs Lab 11/15/14 0200  NA 137  K 4.2  CL 101  CO2 27  BUN 16  CREATININE 0.90  CALCIUM 8.9  GLUCOSE 142*   No results for input(s): CKTOTAL, CKMB, TROPONINI in the last 72 hours. Lab Results  Component Value Date   CHOL 127 10/24/2014   HDL 42 10/24/2014   LDLCALC 62 10/24/2014   TRIG 113 10/24/2014   No results found for: DDIMER  Radiology/Studies:  Dg Chest 2 View  11/11/2014   CLINICAL DATA:  Post pacemaker placement, arm soreness.  EXAM: CHEST  2 VIEW  COMPARISON:  11/09/2014.  FINDINGS: Trachea is midline. Heart size normal. Thoracic aorta is calcified. Left subclavian pacemaker lead tips project over the right atrium, right ventricle and left ventricle, stable. Biapical pleural thickening. Linear scarring or subsegmental atelectasis at the right costophrenic angle. Lungs are otherwise clear. No pneumothorax.  IMPRESSION: Left subclavian pacemaker placement with stable lead position. No complicating features.   Electronically Signed   By: Lorin Picket M.D.   On: 11/11/2014 07:57   Dg Chest 2 View  11/09/2014   CLINICAL DATA:  Cardiac arrhythmias/pacemaker insertion  EXAM: CHEST  2 VIEW  COMPARISON:  November 07, 2014  FINDINGS: There is no a pacemaker on the left with leads attached to the right atrium, right ventricle, and left ventricle. No pneumothorax. There is no edema or consolidation. The heart size and pulmonary vascularity are normal. No adenopathy. There is  atherosclerotic calcification in the aorta.  IMPRESSION: Pacemaker with leads as described. No pneumothorax. No edema or consolidation. No change in cardiac silhouette.   Electronically Signed   By: Lowella Grip III M.D.   On: 11/09/2014 07:05   Dg Chest Port 1 View  11/07/2014   CLINICAL DATA:  Bradycardia  EXAM: PORTABLE CHEST - 1 VIEW  COMPARISON:  05/21/2010  FINDINGS: Cardiac shadow is within normal limits. Aortic calcifications are seen. The right hemidiaphragm is again elevated. No focal infiltrate or sizable effusion is noted.  IMPRESSION: No active disease.   Electronically Signed   By: Inez Catalina M.D.   On: 11/07/2014 20:01   Dg Femur, Min 2 Views Right  11/15/2014   CLINICAL DATA:  Acute onset of right leg pain.  Initial encounter.  EXAM: RIGHT FEMUR 2 VIEWS  COMPARISON:  None.  FINDINGS: The right femur appears intact. There is no evidence of fracture or dislocation. The right femoral head remains seated at the acetabulum.  The right knee joint is grossly unremarkable. No knee joint effusion is identified. Mild vascular calcifications are seen.  IMPRESSION: No evidence of fracture or dislocation.   Electronically Signed   By: Garald Balding M.D.   On: 11/15/2014 02:35   Wt Readings from Last 3 Encounters:  11/15/14 200 lb (90.719 kg)  11/11/14 198 lb 12.8 oz (90.175 kg)  10/24/14 201 lb 4.8 oz (91.309 kg)    Physical Exam: Blood pressure 137/100, pulse 106, temperature 98.9 F (37.2 C), temperature source Rectal, resp. rate 29, height 5\' 10"  (1.778 m), weight 200 lb (90.719 kg), SpO2 94 %. General: Well developed, well nourished WM, in no acute distress. Head: Normocephalic, atraumatic, sclera non-icteric, no xanthomas, nares are without discharge.  Neck: JVD not elevated. Lungs: Clear bilaterally to auscultation without wheezes, rales, or rhonchi. Breathing is unlabored. Heart: RRR (slightly elevated rate) with S1 S2. No murmurs, rubs, or gallops appreciated. Abdomen: Soft,  non-tender, non-distended with normoactive bowel sounds. No hepatomegaly. No rebound/guarding. No obvious abdominal masses. Msk:  Strength and tone appear normal for age. Extremities: No clubbing or cyanosis. No edema.  Distal pedal pulses are 2+ and equal bilaterally. Right groin without obvious hematoma or bruit. Neuro: Alert and oriented; answers questions appropriately but mild cognitive impairment noted. Psych:  Responds to questions appropriately with a normal affect.    ASSESSMENT AND PLAN:   1. Right groin pseudoaneurysm 2. Nonobstructive CAD by recent LHC 3. Chronic systolic CHF/NICM with recent complete heart block s/p BiV pacemaker 4. History of stroke, on Aggrenox 5. Essential HTN with h/o orthostasis 6. Leukocytosis 7. Mild anemia, stable  Patient will be admitted for further management - Dr. Sallyanne Kuster has written orders and plans to continue home meds. Sinus tach and leukocytosis may be due to pseudoaneurysm. Hgb is stable compared to prior. No localizing infective symptoms. Plan is for thrombin injection early afternoon with Dr. Burt Knack. We will recheck CBC in AM. Note he is not on ACEI/ARB given h/o borderline low BP in the past. This is stable for now.   Raechel Ache PA-C 11/15/2014, 11:29 AM Pager: [phone]  I have seen and examined the patient along with Melina Copa PA-C.  I have reviewed the chart, notes and new data.  I agree with PA's note.  Key new complaints: pain and weakness in anterior thigh suggests femoral neuropathy Key examination changes: normal capillary refill in right foot. Thready, but symmetrical pedal pulses Key new findings / data: right femoral artery pseudoaneurysm with relatively wide neck  PLAN: Will try compression of partially thrombosed pseudoaneurysm since the neck is relatively wide, but may have to proceed to thrombin injection or even surgery. Admit for overnight observation and repeat AM imaging either way.  Sanda Klein, MD,  [place] [phone] 11/15/2014, 11:44 AM

## 2014-11-15 NOTE — Progress Notes (Signed)
VASCULAR LAB PRELIMINARY  PRELIMINARY  PRELIMINARY  PRELIMINARY  Right Lower Ext. Venous Duplex completed. No evidence of a deep or superficial vein thrombosis in the right lower extremity. Incidental findings - a right groin pseudoaneurysm measuring approx 4.8 x 3.5 x 2.9 cm. Results given to patient's nurse.  Alla German, RVT 11/15/2014, 8:14 AM

## 2014-11-15 NOTE — Progress Notes (Signed)
Procedure Note:  Procedure: percutaneous thrombin injection  Indication: right femoral artery pseudoaneurysm  Technique: the right groin is prepped using normal sterile technique, then anesthetized with 1% lidocaine. Using ultrasound guidance, an echo brite-tip needle is advanced into the pseudoaneurysm sac which contains a layer of thrombus. The needle is very difficult to visualize inside the sac. A total of 3 attempts were made. At one point I was able to get blood return and was confident the needle was within the sac - thrombin was administered but the pseudoaneurysm remained patent.   Complications: none  Conclusion: unsuccessful attempt at percutaneous thrombin injection of right femoral artery pseudoaneurysm.   Plan: consult vascular surgery for evaluation of treatment options.   Sherren Mocha 11/15/2014 3:42 PM

## 2014-11-15 NOTE — Anesthesia Procedure Notes (Signed)
Procedure Name: Intubation Date/Time: 11/15/2014 5:25 PM Performed by: Clearnce Sorrel Pre-anesthesia Checklist: Patient identified, Timeout performed, Emergency Drugs available, Suction available and Patient being monitored Patient Re-evaluated:Patient Re-evaluated prior to inductionOxygen Delivery Method: Circle system utilized Preoxygenation: Pre-oxygenation with 100% oxygen Intubation Type: IV induction Ventilation: Mask ventilation without difficulty Laryngoscope Size: Mac and 4 Grade View: Grade II Tube type: Oral Tube size: 7.5 mm Number of attempts: 1 Airway Equipment and Method: Stylet Placement Confirmation: ETT inserted through vocal cords under direct vision,  breath sounds checked- equal and bilateral and positive ETCO2 Secured at: 23 cm Tube secured with: Tape Dental Injury: Teeth and Oropharynx as per pre-operative assessment

## 2014-11-15 NOTE — ED Provider Notes (Signed)
History  By signing my name below, I, Marlowe Kays, attest that this documentation has been prepared under the direction and in the presence of Ripley Fraise, MD. Electronically Signed: Marlowe Kays, ED Scribe. 11/15/2014. 3:26 AM.  Chief Complaint  Patient presents with  . Leg Pain   The history is provided by the patient, medical records and the EMS personnel. No language interpreter was used.    HPI Comments:  Thomas Long is a 81 y.o. male, brought in by EMS, who presents to the Emergency Department complaining of progressively worsening right leg pain, thigh and calf, that began earlier today. He states he had a heart catheterization via the RLE with pacemaker placement one week ago. His wife states he has been complaining of right knee pain but reports that this is different. Family states he went to bed this evening and tried to get up to use the bathroom and stated he could not move the leg. He has not done anything to treat the symptoms and denies modifying factors. He denies fever, chills, nausea, vomiting, abdominal pain, numbness, tingling or weakness of the RLE and back pain. He denies trauma, injury or fall. He states he started back on Aggrenox for the past two days.   Past Medical History  Diagnosis Date  . Thyroid disorder   . Depression   . High cholesterol   . Hypertension   . Chronic pain   . Heart disease   . TIA (transient ischemic attack)   . Hx of echocardiogram 05/2010    showed EF 50% with inferior hypokinesis. He did have mild mitral annular calcification, mild TR, mild aortic sclerosis without stenosis.  Marland Kitchen History of stress test 05/2010    A moderate perfusion defect is seen in the apical septal, Basal inferoseptal and mid inferoseptal region. post stress EF 53%. Wall motion abnormalities. Abnormal myocardial perfusion study.  . Presence of permanent cardiac pacemaker   . Facial cellulitis 02/19/2009    "related to [facility] & having skin cancer  zapped; took him off the [place]"  . Sleep apnea     "gone since losing weight"  . Hypothyroidism   . Seizures ([place]) 2011    "vs stroke; never determined which it was; on sz RX since"  (11/08/2014)  . Stroke [facility]) 20111    "vs seizure; never determined which it was; on sz RX since"  (11/08/2014)  . Rheumatoid arthritis ([place])     "hands mainly" (11/08/2014)  . Dementia     "memory lapses q now and then" (11/08/2014)  . Complete heart block ([place]) 11/07/2014    s/p [facility] Quadra Allure MP RF model [id number] (serial number [id number]) biventricular pacemaker   Past Surgical History  Procedure Laterality Date  . Bi-ventricular pacemaker insertion (crt-p)  11/08/2014  . Tonsillectomy    . Cholecystectomy open  1980's  . Exploratory laparotomy  1990's    "put intestines back in & added mesh"  . Appendectomy  ~ 1988  . Cataract extraction w/ intraocular lens  implant, bilateral Bilateral early 2000's  . Knee cartilage surgery Left 1990's    "opened it up"  . Cardiac catheterization  04/01/09    which showed low normal EF at 50% with question of underlying borderline area of minimal inferoapical hypercontractility. He had mild coronary obstructive disease with coronary calcification and segmental 20% narrowing in the LAD proximally, 20% in the mid LAD with muscle bridging. There was calcification at the ostium of the RCA, and 20 to 30%narrowing  of the proximal to mid RCA with 30% distal n  . Cardiac catheterization N/A 11/07/2014    Procedure: Left Heart Cath and Coronary Angiography/ temp wire;  Surgeon: [name], MD;  Location: [facility];  Service: Cardiovascular;  Laterality: N/A;  . Ep implantable device N/A 11/08/2014    Procedure: Pacemaker Implant;  Surgeon: [name], MD; [facility] MP RF model [id number] (serial number [id number]) pacemaker; Laterality: Left  . Ep implantable device N/A 11/10/2014    Procedure: Lead Revision/Repair;  Surgeon: [name], MD;     Family History  Problem Relation Age of Onset  . Heart attack Father   . Cancer - Lung Sister   . Thyroid disease Child    Social History  Substance Use Topics  . Smoking status: Never Smoker   . Smokeless tobacco: Never Used  . Alcohol Use: No    Review of Systems  Constitutional: Negative for fever and chills.  Respiratory: Negative for shortness of breath.   Cardiovascular: Negative for chest pain.  Gastrointestinal: Negative for nausea, vomiting and abdominal pain.  Musculoskeletal: Positive for myalgias. Negative for back pain.  Skin: Positive for wound (RLE catheterization).  Neurological: Negative for syncope, weakness and numbness.  All other systems reviewed and are negative.   Allergies  Review of patient's allergies indicates no known allergies.  Home Medications   Prior to Admission medications   Medication Sig Start Date End Date Taking? Authorizing Provider  AGGRENOX 25-200 MG per 12 hr capsule TAKE 1 CAPSULE TWICE DAILY 01/28/14   Larey Seat, MD  atorvastatin (LIPITOR) 10 MG tablet Take 1 tablet (10 mg total) by mouth daily. 10/03/14   [name], MD  ketoconazole (NIZORAL) 2 % shampoo Apply 1 application topically daily as needed for irritation. AS NEEDED 06/12/14   Historical Provider, MD  latanoprost (XALATAN) 0.005 % ophthalmic solution Place 1 drop into both eyes at bedtime.  04/12/14   Historical Provider, MD  levETIRAcetam (KEPPRA XR) 500 MG 24 hr tablet TAKE 1 TABLET BY MOUTH EVERY DAY 09/29/14   Larey Seat, MD  loratadine (CLARITIN) 10 MG tablet Take 10 mg by mouth daily.    Historical Provider, MD  memantine (NAMENDA) 10 MG tablet TAKE 1 TABLET TWICE A DAY 11/13/14   Larey Seat, MD  Multiple Vitamins-Minerals (CENTRUM SILVER ADULT 50+ PO) Take 1 capsule by mouth daily.    Historical Provider, MD   Triage Vitals: BP 146/72 mmHg  Pulse 104  Temp(Src) 98.4 F (36.9 C) (Oral)  Resp 24  Ht 5\' 10"  (1.778 m)  Wt 200 lb (90.719 kg)  BMI  28.70 kg/m2  SpO2 95%   Physical Exam  CONSTITUTIONAL: Well developed/well nourished HEAD: Normocephalic/atraumatic EYES: EOMI ENMT: Mucous membranes moist NECK: supple no meningeal signs SPINE/BACK:entire spine nontender CV: S1/S2 noted, no murmurs/rubs/gallops noted LUNGS: Lungs are clear to auscultation bilaterally, no apparent distress ABDOMEN: soft, nontender NEURO: Pt is awake/alert/appropriate, moves all extremitiesx4.  No facial droop. Equal motor strength in BLE. EXTREMITIES: pulses normal/equal, full ROM; distal pulses intact. Diffuse right thigh and calf tenderness. No deformity. No erythema or discoloration to either leg SKIN: warm, color normal; well-healing pacemaker site to left chest, well-healing puncture site to right groin, no bruising or erythema, no thrill.  PSYCH: no abnormalities of mood noted, alert and oriented to situation   ED Course  Procedures DIAGNOSTIC STUDIES: Oxygen Saturation is 95% on RA, adequate by my interpretation.   COORDINATION OF CARE: 1:26 AM-  Will order portable X-Ray of right femur and order doppler study for the morning. Will order pain medication. Pt verbalizes understanding and agrees to plan. Will obtain xray to evaluate for occult fx He may need DVT study given recent hospital course and thigh/calf pain 4:10 AM Imaging/labs negative thus far Pt has no new complaints He has no focal weakness in the leg Due to recent hospital course, DVT possible Will hold until morning for DVT study  Medications  oxyCODONE-acetaminophen (PERCOCET/ROXICET) 5-325 MG per tablet 1 tablet (1 tablet Oral Given 11/15/14 0146)    Labs Review Labs Reviewed  CBC WITH DIFFERENTIAL/PLATELET - Abnormal; Notable for the following:    WBC 13.2 (*)    RBC 3.57 (*)    Hemoglobin 11.6 (*)    HCT 34.9 (*)    Neutro Abs 12.3 (*)    Lymphs Abs 0.4 (*)    All other components within normal limits  BASIC METABOLIC PANEL    Imaging Review Dg Femur, Min 2  Views Right  11/15/2014   CLINICAL DATA:  Acute onset of right leg pain.  Initial encounter.  EXAM: RIGHT FEMUR 2 VIEWS  COMPARISON:  None.  FINDINGS: The right femur appears intact. There is no evidence of fracture or dislocation. The right femoral head remains seated at the acetabulum.  The right knee joint is grossly unremarkable. No knee joint effusion is identified. Mild vascular calcifications are seen.  IMPRESSION: No evidence of fracture or dislocation.   Electronically Signed   By: Garald Balding M.D.   On: 11/15/2014 02:35   I have personally reviewed and evaluated these images and lab results as part of my medical decision-making.    MDM   Final diagnoses:  Pain of right lower extremity    Nursing notes including past medical history and social history reviewed and considered in documentation xrays/imaging reviewed by myself and considered during evaluation Labs/vital reviewed myself and considered during evaluation Previous records reviewed and considered    I, Sharyon Cable, personally performed the services described in this documentation. All medical record entries made by the scribe were at my direction and in my presence.  I have reviewed the chart and discharge instructions and agree that the record reflects my personal performance and is accurate and complete. Sharyon Cable.  11/15/2014. 2:32 AM.       Ripley Fraise, MD 11/15/14 [phone]

## 2014-11-15 NOTE — ED Notes (Signed)
Called main lab, rea verifies she has received blood samples.

## 2014-11-15 NOTE — Transfer of Care (Signed)
Immediate Anesthesia Transfer of Care Note  Patient: Thomas Long  Procedure(s) Performed: Procedure(s): REPAIR OF RIGHT FEMORAL FALSE ANEURYSM  (Right)  Patient Location: PACU  Anesthesia Type:General  Level of Consciousness: awake and patient cooperative  Airway & Oxygen Therapy: Patient Spontanous Breathing and Patient connected to nasal cannula oxygen  Post-op Assessment: Report given to RN and Post -op Vital signs reviewed and stable  Post vital signs: Reviewed and stable  Last Vitals:  Filed Vitals:   11/15/14 1245  BP: 134/77  Pulse: 95  Temp:   Resp:     Complications: No apparent anesthesia complications

## 2014-11-15 NOTE — Telephone Encounter (Signed)
New MEssage   Pt adamantly wanted to let our office know that pt is in the ED.

## 2014-11-15 NOTE — ED Notes (Signed)
Vascular at bedside

## 2014-11-15 NOTE — Progress Notes (Signed)
VASCULAR LAB PRELIMINARY  PRELIMINARY  PRELIMINARY  PRELIMINARY  Right groin pseudoaneurysm thrombin injection completed.    Preliminary report:  Unsuccessful closure of pseudoaneurysm by thrombin injection with compression.   Lakesha Levinson, RVT 11/15/2014, 3:33 PM

## 2014-11-15 NOTE — ED Notes (Signed)
Pharmacy notified to transport injection to 3W when available.

## 2014-11-15 NOTE — ED Provider Notes (Signed)
Received patient in turnover from Dr. Christy Gentles.  Patient with a recent left heart cath. Complaining of pain and swelling to his right leg. Awaiting DVT study.  DVT study with pseudoaneurysm.  Patient reevaluated at bedside. On physical exam patient with intact femoral popliteal and posterior tibialis pulse of the right leg. Equal to the left. Sensation and motor intact as well. No noted bruits noted to the right femoral. Discussed case with Dr. Donnetta Hutching, vascular surgery. Recommend cardiology evaluation. Cardiology consultation will attempt direct pressure. Will admit.  The patients results and plan were reviewed and discussed.   Any x-rays performed were independently reviewed by myself.   Differential diagnosis were considered with the presenting HPI.  Medications  morphine 2 MG/ML injection 2 mg (2 mg Intravenous Given 11/15/14 1009)  oxyCODONE-acetaminophen (PERCOCET/ROXICET) 5-325 MG per tablet 1 tablet (1 tablet Oral Given 11/15/14 0146)  ondansetron (ZOFRAN) injection 4 mg (4 mg Intravenous Given 11/15/14 1009)    Filed Vitals:   11/15/14 0800 11/15/14 0840 11/15/14 0850 11/15/14 0930  BP: 128/68 118/71  132/77  Pulse: 101 109  100  Temp:   98.9 F (37.2 C)   TempSrc:   Rectal   Resp: 22   17  Height:      Weight:      SpO2: 95% 94%  96%    Final diagnoses:  Pain of right lower extremity  Pseudoaneurysm of femoral artery (HCC)    Admission/ observation were discussed with the admitting physician, patient and/or family and they are comfortable with the plan.    Deno Etienne, DO 11/15/14 1016

## 2014-11-15 NOTE — Anesthesia Postprocedure Evaluation (Signed)
  Anesthesia Post-op Note  Patient: Thomas Long  Procedure(s) Performed: Procedure(s): REPAIR OF RIGHT FEMORAL FALSE ANEURYSM  (Right)  Patient Location: PACU  Anesthesia Type: General   Level of Consciousness: awake, alert  and oriented  Airway and Oxygen Therapy: Patient Spontanous Breathing  Post-op Pain: mild  Post-op Assessment: Post-op Vital signs reviewed  Post-op Vital Signs: Reviewed  Last Vitals:  Filed Vitals:   11/15/14 1930  BP: 114/55  Pulse: 117  Temp:   Resp: 20    Complications: No apparent anesthesia complications

## 2014-11-15 NOTE — Anesthesia Preprocedure Evaluation (Signed)
Anesthesia Evaluation  Patient identified by MRN, date of birth, ID band Patient awake    Reviewed: Allergy & Precautions, NPO status , Patient's Chart, lab work & pertinent test results  Airway Mallampati: II  TM Distance: >3 FB Neck ROM: Full    Dental no notable dental hx.    Pulmonary    Pulmonary exam normal breath sounds clear to auscultation       Cardiovascular hypertension, Pt. on medications +CHF  Normal cardiovascular exam+ pacemaker  Rhythm:Regular Rate:Normal  NICM (nonischemic cardiomyopathy) ([place])  a. 10/2014: EF 45%. (Prev 40-45% by echo 08/2014).     Neuro/Psych TIACVA negative psych ROS   GI/Hepatic negative GI ROS, Neg liver ROS,   Endo/Other  negative endocrine ROS  Renal/GU negative Renal ROS  negative genitourinary   Musculoskeletal negative musculoskeletal ROS (+)   Abdominal   Peds negative pediatric ROS (+)  Hematology negative hematology ROS (+)   Anesthesia Other Findings   Reproductive/Obstetrics negative OB ROS                             Anesthesia Physical Anesthesia Plan  ASA: III  Anesthesia Plan: General   Post-op Pain Management:    Induction: Intravenous  Airway Management Planned: Oral ETT  Additional Equipment:   Intra-op Plan:   Post-operative Plan: Extubation in OR  Informed Consent: I have reviewed the patients History and Physical, chart, labs and discussed the procedure including the risks, benefits and alternatives for the proposed anesthesia with the patient or authorized representative who has indicated his/her understanding and acceptance.   Dental advisory given  Plan Discussed with: CRNA and Surgeon  Anesthesia Plan Comments: (Arterial line if needed)        Anesthesia Quick Evaluation

## 2014-11-15 NOTE — Telephone Encounter (Signed)
Spoke with wife yesterday and called her today after she called to let us know he is going to the hospital.  He was only c/o knee pain, not groin pain but in the ER a vascular study was done:  DVT study with pseudoaneurysm. Patient reevaluated at bedside. On physical exam patient with intact femoral popliteal and posterior tibialis pulse of the right leg. Equal to the left. Sensation and motor intact as well. No noted bruits noted to the right femoral. Discussed case with Dr. Donnetta Hutching, vascular surgery. Recommend cardiology evaluation. Cardiology consultation will attempt direct pressure. Will admit.  Left wife a message to call us if needed.

## 2014-11-15 NOTE — ED Notes (Signed)
Per EMS, pt presents with rt leg pain, pt had cardiac cath last week with pacemaker placement and no blockages were found. Pt is having pain on the right leg that started today and progressively got worse, pt was discharged yesterday from hospital. Pt stable and NAD. EMS VS CBG 219, 100 HR Paced rhthm, RR 18 and regular, SPO2 95% on RA, Lung sounds clear. Pt has no other complaints.

## 2014-11-15 NOTE — Op Note (Signed)
    OPERATIVE REPORT  DATE OF SURGERY: 11/15/2014  PATIENT: Thomas Long, 81 y.o. male MRN: [id number]  DOB: October 12, 1931  PRE-OPERATIVE DIAGNOSIS: Right femoral false aneurysm  POST-OPERATIVE DIAGNOSIS:  Same  PROCEDURE: Right groin exploration and repair of right deep femoral artery  SURGEON:  Curt Jews, M.D.  PHYSICIAN ASSISTANT: Nurse  ANESTHESIA:  Gen.  EBL: 100 ml     BLOOD ADMINISTERED: None  DRAINS: None  SPECIMEN: None  COUNTS CORRECT:  YES  PLAN OF CARE: PACU   PATIENT DISPOSITION:  PACU - hemodynamically stable  PROCEDURE DETAILS: Patient was taken to the operating placed supine position where the area of the right groin from the sterile fashion. Oblique incision was made at the level of the inguinal crease and carried down to the femoral artery. There was old hematoma present. The common femoral artery was exposed occluded with a vascular clamp. Further dissection resulted in entering of the false aneurysm sac. There was arterial red arterial bleeding from this. This was controlled with digital pressure. On further dissection was apparent that this was actually a puncture into the deep femoral artery several centimeters from the origin. This was a controlled with digital pressure and the hole in the artery from the puncture was closed with a figure-of-eight 50 proline suture. Clamps removed from the common femoral artery. Hemostasis was obtained. Should be mentioned the patient was given 5000 units intravenous heparin when the false aneurysm was entered. The superficial femoral artery common and deep femoral artery were further explored there was no other evidence of injury. The wounds were irrigated with saline and hemostasis tablet cautery. The patient was given 50 mg of protamine to reverse the heparin. Closed with 2-0 Monocryl subcuticular micral suture.. Sterile dressing was applied the patient was transferred to the recovery room in stable condition. Patient had  2+ posterior tibial and to 3+ popliteal pulse on the right at completion of the procedure   Curt Jews, M.D. 11/15/2014 6:38 PM

## 2014-11-16 DIAGNOSIS — T81718A Complication of other artery following a procedure, not elsewhere classified, initial encounter: Secondary | ICD-10-CM | POA: Diagnosis not present

## 2014-11-16 DIAGNOSIS — I10 Essential (primary) hypertension: Secondary | ICD-10-CM

## 2014-11-16 LAB — CBC
HCT: 30 % — ABNORMAL LOW (ref 39.0–52.0)
Hemoglobin: 9.9 g/dL — ABNORMAL LOW (ref 13.0–17.0)
MCH: 32.5 pg (ref 26.0–34.0)
MCHC: 33 g/dL (ref 30.0–36.0)
MCV: 98.4 fL (ref 78.0–100.0)
Platelets: 291 10*3/uL (ref 150–400)
RBC: 3.05 MIL/uL — ABNORMAL LOW (ref 4.22–5.81)
RDW: 13 % (ref 11.5–15.5)
WBC: 11.4 10*3/uL — ABNORMAL HIGH (ref 4.0–10.5)

## 2014-11-16 LAB — BASIC METABOLIC PANEL
Anion gap: 6 (ref 5–15)
BUN: 16 mg/dL (ref 6–20)
CO2: 27 mmol/L (ref 22–32)
Calcium: 8.1 mg/dL — ABNORMAL LOW (ref 8.9–10.3)
Chloride: 103 mmol/L (ref 101–111)
Creatinine, Ser: 0.82 mg/dL (ref 0.61–1.24)
GFR calc Af Amer: >60 mL/min (ref >60)
GFR calc non Af Amer: >60 mL/min (ref >60)
Glucose, Bld: 119 mg/dL — ABNORMAL HIGH (ref 65–99)
Potassium: 4.5 mmol/L (ref 3.5–5.1)
Sodium: 136 mmol/L (ref 135–145)

## 2014-11-16 LAB — MRSA PCR SCREENING: MRSA by PCR: NEGATIVE

## 2014-11-16 NOTE — Progress Notes (Signed)
Utilization Review Completed.  

## 2014-11-16 NOTE — Progress Notes (Signed)
Discharge instructions given. No questions or concerns at this time. IVs d/c'd. Tips intact. Pt tolerated well.

## 2014-11-16 NOTE — Evaluation (Addendum)
Physical Therapy Evaluation Patient Details Name: Thomas Long MRN: [id number] DOB: May 10, 1931 Today's Date: 11/16/2014   History of Present Illness  Patient is 81 yo married male who underwent Lt sided pacer placement and Rt groin access for catheterization.  He developed Rt leg weakness and Rt groin pain. 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  Clinical Impression  Patient with mobility issues today including Rt leg weakness and instability during gait. ambulated without device prior to admission. He sustained a fall while walking with therapist when without assistive device around foot of bed in room.  Patient with some cognitive deficits including difficulty with problem solving and difficulty following commands. Unclear whether this is baseline or not.  Patient has equipment at home that belonged to another family member, therefore advised pt's wife and dau how to properly fit equipment to his height and advised them to call 911 should he have a fall at home as wife would not be able to transfer him without aid should he fall to floor.  During session today, patient's Rt leg gave way and he fell onto his buttocks.  Therapist was unable to slow fall. Assessed pt who denied injury.  Required +2 max assist to get off the floor via tall kneel and furniture support.  Patient able to recuperate and amb with RW on unit and needed cues to manage a walker and path finding.  Did not perform stair training today as his cardiac monitor demo fluctuating HR including at rest. Verbal instruction to pt/family on how to enter their home with return demo understanding. Family appeared they were ok with discharge home and agree to f/u recommendations.    Follow Up Recommendations Home health PT;Supervision/Assistance - 24 hour    Equipment Recommendations  None recommended by PT    Recommendations for Other Services       Precautions / Restrictions Precautions Precautions: Fall Precaution  Comments: reports fall onto his bed at home prior to admission Restrictions Weight Bearing Restrictions: No      Mobility  Bed Mobility Overal bed mobility: Needs Assistance Bed Mobility: Supine to Sit;Sit to Supine     Supine to sit: Min assist (with rail, has adjustable bed at time) Sit to supine: +2 for physical assistance (max cues for sequencing)   General bed mobility comments: +2 assist to scoot to [facility] with elevated FOB  Transfers Overall transfer level: Needs assistance Equipment used: Rolling walker (2 wheeled);None Transfers: Sit to/from Stand Sit to Stand: Min assist (min cues for hand placement)         General transfer comment: sit to stand from chair without device initially SBA, min assist from bed to RW after fall to floor  Ambulation/Gait Ambulation/Gait assistance: Min assist;+2 safety/equipment Ambulation Distance (Feet): 120 Feet Assistive device: Rolling walker (2 wheeled);1 person hand held assist Gait Pattern/deviations: Shuffle;Trunk flexed     General Gait Details: HHA initially 6' until Rt leg gave way and subsequent fall to floor.  Amb with RW afterwards on unit, mod cues for path finding and walker management  Stairs            Wheelchair Mobility    Modified Rankin (Stroke Patients Only)       Balance Overall balance assessment: Needs assistance Sitting-balance support: No upper extremity supported;Feet supported Sitting balance-Leahy Scale: Good     Standing balance support: Bilateral upper extremity supported Standing balance-Leahy Scale: Poor  Pertinent Vitals/Pain Pain Assessment: 0-10 Pain Score: 2  Pain Location: Rt anterior hip and thigh Pain Descriptors / Indicators: Sore;Aching Pain Intervention(s): Limited activity within patient's tolerance;Monitored during session    Home Living Family/patient expects to be discharged to:: Private residence Living Arrangements:  Spouse/significant other;Children Available Help at Discharge: Family;Available 24 hours/day Type of Home: House Home Access: Stairs to enter Entrance Stairs-Rails: Psychiatric nurse of Steps: 2 (back door) Home Layout: One level Home Equipment: [place] - 2 wheels;Bedside commode;Other (comment) (belonged to another family member) Additional Comments: dau lives in house with patient, other family members down street and readily accessible    Prior Function Level of Independence: Independent         Comments: ambulated without assistive device     Hand Dominance   Dominant Hand: Right    Extremity/Trunk Assessment   Upper Extremity Assessment: Overall WFL for tasks assessed           Lower Extremity Assessment: Generalized weakness (partial range Rt hip flexors, Rt knee ext)      Cervical / Trunk Assessment: Kyphotic  Communication   Communication: No difficulties  Cognition Arousal/Alertness: Awake/alert Behavior During Therapy: WFL for tasks assessed/performed Overall Cognitive Status: Impaired/Different from baseline Area of Impairment: Following commands;Safety/judgement;Awareness     Memory: Decreased recall of precautions Following Commands: Follows one step commands inconsistently Safety/Judgement: Decreased awareness of deficits Awareness: Intellectual   General Comments: unclear level of cognitive issues prior to admission    General Comments General comments (skin integrity, edema, etc.): no swelling noted Rt lower leg    Exercises Other Exercises Other Exercises: ankle pumps, seated bil x 5 each Other Exercises: isometric hip adduct (towel squeeze) 3 sec hold x 10 reps sitting Other Exercises: LAQ Rt x 3 reps -limited by pain Other Exercises: seated hip flexion Rt seated x 3 reps-limited by pain      Assessment/Plan    PT Assessment Patient needs continued PT services  PT Diagnosis Difficulty walking;Abnormality of  gait;Generalized weakness   PT Problem List Decreased strength;Decreased range of motion;Decreased activity tolerance;Decreased balance;Decreased mobility;Decreased cognition;Decreased knowledge of use of DME;Decreased safety awareness;Cardiopulmonary status limiting activity;Pain  PT Treatment Interventions DME instruction;Gait training;Stair training;Functional mobility training;Therapeutic activities;Therapeutic exercise;Balance training;Neuromuscular re-education;Patient/family education   PT Goals (Current goals can be found in the Care Plan section) Acute Rehab PT Goals Patient Stated Goal: make Rt hip feel better PT Goal Formulation: With patient/family Time For Goal Achievement: 11/23/14 Potential to Achieve Goals: Good    Frequency Min 3X/week   Barriers to discharge Decreased caregiver support wife would not be able to assist patient without other family member assist. Wife/pt/dau appear comfortable with current pt function if discharge home    Co-evaluation               End of Session Equipment Utilized During Treatment: Gait belt Activity Tolerance: Patient limited by fatigue Patient left: in chair;with call bell/phone within reach;with family/visitor present Nurse Communication: Mobility status;Precautions         Time: 1355-1450 PT Time Calculation (min) (ACUTE ONLY): 55 min   Charges:   PT Evaluation $Initial PT Evaluation Tier I: 1 Procedure PT Treatments $Gait Training: 8-22 mins $Therapeutic Activity: 8-22 mins   PT G CodesMalka So, [place] [phone]  [place] 11/16/2014, 4:06 PM

## 2014-11-16 NOTE — Progress Notes (Addendum)
CM received call from RN requesting [place] arrangements.  CM met with pt and spouse and daughter in room to offer choice of home health agency.  Pt chooses [facility] to render HHPT.  Address and contact information verified by pt.  Referral called to [facility] rep, Tiffany.  Pt has a rolling walker at home. No other CM needs were communicated.

## 2014-11-16 NOTE — Progress Notes (Addendum)
[facility]  Subjective  - Doing better althoughwhen he walked around the floor and his right knee gave way 3 times.     Objective 111/63 108 97.9 F (36.6 C) (Oral) 11 95%  Intake/Output Summary (Last 24 hours) at 11/16/14 0757 Last data filed at 11/16/14 0400  Gross per 24 hour  Intake   1310 ml  Output    300 ml  Net   1010 ml   Palpable PT right LE Groin soft without frank hematoma Heart RRR paced Lungs non labored breathing   Assessment/Planning: POD # 1 Right groin exploration and repair of right deep femoral artery Ambulating with some difficulty Pain in groin improved Pending cardiology pace maker check before D/C F/U in 2-3 weeks with Dr. Tonie Griffith, [facility] 11/16/2014 7:57 AM --  Laboratory Lab Results:  Recent Labs  11/15/14 0200 11/16/14 0234  WBC 13.2* 11.4*  HGB 11.6* 9.9*  HCT 34.9* 30.0*  PLT 320 291   BMET  Recent Labs  11/15/14 0200 11/16/14 0234  NA 137 136  K 4.2 4.5  CL 101 103  CO2 27 27  GLUCOSE 142* 119*  BUN 16 16  CREATININE 0.90 0.82  CALCIUM 8.9 8.1*    COAG Lab Results  Component Value Date   INR 1.19 11/15/2014   No results found for: PTT    I have examined the patient, reviewed and agree with above. Groin stable. Palpable popliteal and distal pulses. Stable for discharge from vascular standpoint from false aneurysm repair  Curt Jews, MD 11/16/2014 11:21 AM

## 2014-11-16 NOTE — Progress Notes (Signed)
Patient Name: Thomas Long Date of Encounter: 11/16/2014  Active Problems:   CAD in native artery   Complete heart block (HCC)   Pseudoaneurysm following procedure (HCC)   Essential hypertension   Chronic systolic CHF (congestive heart failure) ([place])   History of stroke   Leukocytosis   Mild anemia   Length of Stay: 1  SUBJECTIVE  The patient feels better, improved groin pain. He was walking today with complain of the right knee pain.   CURRENT MEDS . atorvastatin  10 mg Oral Daily  . cefUROXime (ZINACEF)  IV  1.5 g Intravenous Q12H  . dipyridamole-aspirin  1 capsule Oral BID  . docusate sodium  100 mg Oral Daily  . enoxaparin (LOVENOX) injection  40 mg Subcutaneous Q24H  . latanoprost  1 drop Both Eyes QHS  . levETIRAcetam  500 mg Oral Daily  . loratadine  10 mg Oral Daily  . memantine  10 mg Oral BID  . pantoprazole  40 mg Oral Daily   OBJECTIVE  Filed Vitals:   11/16/14 0342 11/16/14 0700 11/16/14 0740 11/16/14 1100  BP: 111/63  106/58   Pulse: 108  110   Temp: 97.9 F (36.6 C) 98.6 F (37 C)  97.7 F (36.5 C)  TempSrc: Oral Oral  Oral  Resp: 11  14   Height:      Weight:      SpO2: 95%  92%     Intake/Output Summary (Last 24 hours) at 11/16/14 1225 Last data filed at 11/16/14 0953  Gross per 24 hour  Intake   1310 ml  Output    351 ml  Net    959 ml   Filed Weights   11/15/14 0102 11/15/14 1245  Weight: 200 lb (90.719 kg) 206 lb 12.7 oz (93.8 kg)    PHYSICAL EXAM  General: Pleasant, NAD. Neuro: Alert and oriented X 3. Moves all extremities spontaneously. Psych: Normal affect. HEENT:  Normal  Neck: Supple without bruits or JVD. Lungs:  Resp regular and unlabored, CTA. Heart: RRR no s3, s4, or murmurs. Abdomen: Soft, non-tender, non-distended, BS + x 4.  Extremities: No clubbing, cyanosis or edema. DP/PT/Radials 2+ and equal bilaterally.  Accessory Clinical Findings  CBC  Recent Labs  11/15/14 0200 11/16/14 0234  WBC 13.2* 11.4*    NEUTROABS 12.3*  --   HGB 11.6* 9.9*  HCT 34.9* 30.0*  MCV 97.8 98.4  PLT 320 962   Basic Metabolic Panel  Recent Labs  11/15/14 0200 11/16/14 0234  NA 137 136  K 4.2 4.5  CL 101 103  CO2 27 27  GLUCOSE 142* 119*  BUN 16 16  CREATININE 0.90 0.82  CALCIUM 8.9 8.1*   Dg Femur, Min 2 Views Right  11/15/2014   CLINICAL DATA:  Acute onset of right leg pain.  Initial encounter.  EXAM: RIGHT FEMUR 2 VIEWS  COMPARISON:  None.  FINDINGS: The right femur appears intact. There is no evidence of fracture or dislocation. The right femoral head remains seated at the acetabulum.  The right knee joint is grossly unremarkable. No knee joint effusion is identified. Mild vascular calcifications are seen.  IMPRESSION: No evidence of fracture or dislocation.   Electronically Signed   By: Garald Balding M.D.   On: 11/15/2014 02:35   TELE:     ASSESSMENT AND PLAN  1. Right groin pseudoaneurysm 2. Nonobstructive CAD by recent LHC 3. Chronic systolic CHF/NICM with recent complete heart block s/p BiV pacemaker 4. History of  stroke, on Aggrenox 5. Essential HTN with h/o orthostasis 6. Leukocytosis 7. Mild anemia, stable  Thomas Long is an 81 y/o M with history of recent CHB s/p BiV-PPM 10/2014, NICM EF 45% (LHC 10/2014, EF 40-45% by echo 08/2014), nonobstructive CAD by cath 10/2014, HTN w/ history of orthostasis, HLD, prior TIA, prior stroke vs seizure, hypothyroidism, dementia who presented to [facility] overnight with right leg pain. He was found to have a right groin pseudoaneurysm measuring approx 4.8 x 3.5 x 2.9 cm. Pseudoaneurysm compression was attempted for over 20 minutes but was unsuccessful with little change in size. Also unsuccessful attempt at percutaneous thrombin injection of right femoral artery pseudoaneurysm.  He underwent a right groin exploration and repair of right deep femoral artery by vascular surgery on 11/15/2014. Improved groin pain.   He feels well. He can go home today,  I have checked his PM implantation would, there is only mild erythema, it looks well healing, no tenderness, no discharge.   We will notify the clinic to reschedule his appointment from this Monday 10-9 to 3 months from now. I have personally talked to [facility] rep who checked his PM 2 days ago and states that it is functioning properly.   Signed, Dorothy Spark MD, [facility] 11/16/2014

## 2014-11-16 NOTE — Discharge Summary (Signed)
Physician Discharge Summary  Patient ID: [name] MRN: [id number] DOB/AGE: March 26, 1931 81 y.o.   Primary Cardiologist: Dr. Claiborne Billings Electrophysiologist: Dr. Rayann Heman  Admit date: 11/15/2014 Discharge date: 11/16/2014  Admission Diagnoses: right groin pseudoaneurysm  Discharge Diagnoses:  Active Problems:   CAD in native artery   Complete heart block (HCC)   Pseudoaneurysm following procedure (HCC)   Essential hypertension   Chronic systolic CHF (congestive heart failure) (HCC)   History of stroke   Leukocytosis   Mild anemia   Discharged Condition: stable  Hospital Course: [name] is an 81 y/o M with history of recent CHB s/p BiV-PPM 10/2014, NICM EF 45% (LHC 10/2014, EF 40-45% by echo 08/2014), nonobstructive CAD by cath 10/2014, HTN w/ history of orthostasis, HLD, prior TIA, prior stroke vs seizure, hypothyroidism, dementia who presented to [facility] overnight with right leg pain. He was found to have a right groin pseudoaneurysm measuring approx 4.8 x 3.5 x 2.9 cm. Pseudoaneurysm compression was attempted for over 20 minutes but was unsuccessful with little change in size. Also unsuccessful attempt at percutaneous thrombin injection of right femoral artery pseudoaneurysm. He underwent a right groin exploration and repair of right deep femoral artery by vascular surgery on 11/15/2014. He tolerated the surgery well with resolution of pain. He was seen by Dr. Donnetta Hutching of Vascular Surgery on post op day #1. His groin was stable with palpable popliteal and distal pulses. He deemed him stable for discharge from a vascular standpoint. He was also seen by Dr. Meda Coffee who cleared him for discharge as well. He will f/u with Dr. Donnetta Hutching in 2 weeks.   Consults: vascular surgery  Significant Diagnostic Studies:   Treatments: See Hospital Course  Discharge Exam: Blood pressure 106/58, pulse 110, temperature 97.7 F (36.5 C), temperature source Oral, resp. rate 14, height 5\' 9"  (1.753 m),  weight 206 lb 12.7 oz (93.8 kg), SpO2 92 %.   Disposition: 01-Home or Self Care     Medication List    TAKE these medications        AGGRENOX 200-25 MG 12hr capsule  Generic drug:  dipyridamole-aspirin  TAKE 1 CAPSULE TWICE DAILY     atorvastatin 10 MG tablet  Commonly known as:  LIPITOR  Take 1 tablet (10 mg total) by mouth daily.     CENTRUM SILVER ADULT 50+ PO  Take 1 capsule by mouth daily.     ketoconazole 2 % shampoo  Commonly known as:  NIZORAL  Apply 1 application topically daily as needed for irritation. AS NEEDED     latanoprost 0.005 % ophthalmic solution  Commonly known as:  XALATAN  Place 1 drop into both eyes at bedtime.     levETIRAcetam 500 MG 24 hr tablet  Commonly known as:  KEPPRA XR  TAKE 1 TABLET BY MOUTH EVERY DAY     loratadine 10 MG tablet  Commonly known as:  CLARITIN  Take 10 mg by mouth daily.     memantine 10 MG tablet  Commonly known as:  NAMENDA  TAKE 1 TABLET TWICE A DAY       Follow-up Information    Follow up with Curt Jews, MD In 2 weeks.   Specialties:  Vascular Surgery, Cardiology   Why:  Office will call you to arrange your appt (sent)   Contact information:   [facility] [address] [place]: >30 MINTUES  Signed: Mickeal Daws 11/16/2014, 1:15  PM   

## 2014-11-16 NOTE — Progress Notes (Signed)
Received call back from [facility] PA. No new orders received.

## 2014-11-16 NOTE — Progress Notes (Signed)
Notified PA on call of pt fall. Will continue to monitor pt.

## 2014-11-18 ENCOUNTER — Encounter (HOSPITAL_COMMUNITY): Payer: Self-pay | Admitting: Vascular Surgery

## 2014-11-18 ENCOUNTER — Telehealth: Payer: Self-pay | Admitting: Cardiovascular Disease

## 2014-11-18 ENCOUNTER — Ambulatory Visit: Payer: Medicare Other | Admitting: Nurse Practitioner

## 2014-11-18 ENCOUNTER — Ambulatory Visit: Payer: Medicare Other

## 2014-11-18 NOTE — Telephone Encounter (Signed)
Spoke to wife . She states aware that appointment today is cancelled. Patient received instruction  From Dr Meda Coffee.  Dr Meda Coffee reviewed pacer site.  Needs follow up appointment in 3 months.- cardiologist Needs appointment with surgeons in 2 weeks. Sent to scheduler cancel appointment with Cecille Rubin , and wound clinic   Patient understands discharge instructions? yes  Patient understands medications and regiment? yes

## 2014-11-18 NOTE — Telephone Encounter (Signed)
Pt needs a post hosp f/u call He is scheduled to f/u with Truitt Merle at [address] on 10/10 at 11:30am  Thanks

## 2014-11-19 ENCOUNTER — Telehealth: Payer: Self-pay | Admitting: Vascular Surgery

## 2014-11-19 DIAGNOSIS — E785 Hyperlipidemia, unspecified: Secondary | ICD-10-CM | POA: Diagnosis not present

## 2014-11-19 DIAGNOSIS — F0390 Unspecified dementia without behavioral disturbance: Secondary | ICD-10-CM | POA: Diagnosis not present

## 2014-11-19 DIAGNOSIS — Z48812 Encounter for surgical aftercare following surgery on the circulatory system: Secondary | ICD-10-CM | POA: Diagnosis not present

## 2014-11-19 DIAGNOSIS — I10 Essential (primary) hypertension: Secondary | ICD-10-CM | POA: Diagnosis not present

## 2014-11-19 DIAGNOSIS — G8929 Other chronic pain: Secondary | ICD-10-CM | POA: Diagnosis not present

## 2014-11-19 DIAGNOSIS — I429 Cardiomyopathy, unspecified: Secondary | ICD-10-CM | POA: Diagnosis not present

## 2014-11-19 DIAGNOSIS — Z7901 Long term (current) use of anticoagulants: Secondary | ICD-10-CM | POA: Diagnosis not present

## 2014-11-19 DIAGNOSIS — M069 Rheumatoid arthritis, unspecified: Secondary | ICD-10-CM | POA: Diagnosis not present

## 2014-11-19 DIAGNOSIS — Z950 Presence of cardiac pacemaker: Secondary | ICD-10-CM | POA: Diagnosis not present

## 2014-11-19 DIAGNOSIS — I5022 Chronic systolic (congestive) heart failure: Secondary | ICD-10-CM | POA: Diagnosis not present

## 2014-11-19 DIAGNOSIS — F329 Major depressive disorder, single episode, unspecified: Secondary | ICD-10-CM | POA: Diagnosis not present

## 2014-11-19 DIAGNOSIS — E039 Hypothyroidism, unspecified: Secondary | ICD-10-CM | POA: Diagnosis not present

## 2014-11-19 DIAGNOSIS — I2510 Atherosclerotic heart disease of native coronary artery without angina pectoris: Secondary | ICD-10-CM | POA: Diagnosis not present

## 2014-11-19 DIAGNOSIS — G4733 Obstructive sleep apnea (adult) (pediatric): Secondary | ICD-10-CM | POA: Diagnosis not present

## 2014-11-19 DIAGNOSIS — Z9181 History of falling: Secondary | ICD-10-CM | POA: Diagnosis not present

## 2014-11-19 DIAGNOSIS — Z8673 Personal history of transient ischemic attack (TIA), and cerebral infarction without residual deficits: Secondary | ICD-10-CM | POA: Diagnosis not present

## 2014-11-19 NOTE — Telephone Encounter (Signed)
-----   Message from Mena Goes, RN sent at 11/17/2014  8:11 PM EDT ----- Regarding: schedule   ----- Message -----    From: Ulyses Amor, PA-C    Sent: 11/16/2014  10:23 AM      To: Vvs Charge Pool  F/U 2-3 weeks with Dr. Donnetta Hutching s/p femoral artery repair / hematoma evauation

## 2014-11-19 NOTE — Telephone Encounter (Signed)
Close encounter 

## 2014-11-19 NOTE — Telephone Encounter (Signed)
Unable to reach pt at home #, mailed letter, dpm °

## 2014-11-20 ENCOUNTER — Telehealth: Payer: Self-pay | Admitting: *Deleted

## 2014-11-20 NOTE — Telephone Encounter (Signed)
Per spouse, device was checked prior to discharge by industry Gaspar Bidding). Per spouse, wound was checked in hospital at day of discharge on 11/16/14.   Spouse aware ROV w/ Dr. Rayann Heman 12/25/14.

## 2014-11-25 DIAGNOSIS — I2510 Atherosclerotic heart disease of native coronary artery without angina pectoris: Secondary | ICD-10-CM | POA: Diagnosis not present

## 2014-11-25 DIAGNOSIS — F0390 Unspecified dementia without behavioral disturbance: Secondary | ICD-10-CM | POA: Diagnosis not present

## 2014-11-25 DIAGNOSIS — I5022 Chronic systolic (congestive) heart failure: Secondary | ICD-10-CM | POA: Diagnosis not present

## 2014-11-25 DIAGNOSIS — M069 Rheumatoid arthritis, unspecified: Secondary | ICD-10-CM | POA: Diagnosis not present

## 2014-11-25 DIAGNOSIS — Z48812 Encounter for surgical aftercare following surgery on the circulatory system: Secondary | ICD-10-CM | POA: Diagnosis not present

## 2014-11-25 DIAGNOSIS — G8929 Other chronic pain: Secondary | ICD-10-CM | POA: Diagnosis not present

## 2014-11-29 DIAGNOSIS — I5022 Chronic systolic (congestive) heart failure: Secondary | ICD-10-CM | POA: Diagnosis not present

## 2014-11-29 DIAGNOSIS — M069 Rheumatoid arthritis, unspecified: Secondary | ICD-10-CM | POA: Diagnosis not present

## 2014-11-29 DIAGNOSIS — F0390 Unspecified dementia without behavioral disturbance: Secondary | ICD-10-CM | POA: Diagnosis not present

## 2014-11-29 DIAGNOSIS — Z48812 Encounter for surgical aftercare following surgery on the circulatory system: Secondary | ICD-10-CM | POA: Diagnosis not present

## 2014-11-29 DIAGNOSIS — I2510 Atherosclerotic heart disease of native coronary artery without angina pectoris: Secondary | ICD-10-CM | POA: Diagnosis not present

## 2014-11-29 DIAGNOSIS — G8929 Other chronic pain: Secondary | ICD-10-CM | POA: Diagnosis not present

## 2014-12-02 DIAGNOSIS — M069 Rheumatoid arthritis, unspecified: Secondary | ICD-10-CM | POA: Diagnosis not present

## 2014-12-02 DIAGNOSIS — Z48812 Encounter for surgical aftercare following surgery on the circulatory system: Secondary | ICD-10-CM | POA: Diagnosis not present

## 2014-12-02 DIAGNOSIS — I5022 Chronic systolic (congestive) heart failure: Secondary | ICD-10-CM | POA: Diagnosis not present

## 2014-12-02 DIAGNOSIS — I2510 Atherosclerotic heart disease of native coronary artery without angina pectoris: Secondary | ICD-10-CM | POA: Diagnosis not present

## 2014-12-02 DIAGNOSIS — G8929 Other chronic pain: Secondary | ICD-10-CM | POA: Diagnosis not present

## 2014-12-02 DIAGNOSIS — F0390 Unspecified dementia without behavioral disturbance: Secondary | ICD-10-CM | POA: Diagnosis not present

## 2014-12-05 ENCOUNTER — Encounter: Payer: Self-pay | Admitting: Vascular Surgery

## 2014-12-06 DIAGNOSIS — G8929 Other chronic pain: Secondary | ICD-10-CM | POA: Diagnosis not present

## 2014-12-06 DIAGNOSIS — M069 Rheumatoid arthritis, unspecified: Secondary | ICD-10-CM | POA: Diagnosis not present

## 2014-12-06 DIAGNOSIS — I2510 Atherosclerotic heart disease of native coronary artery without angina pectoris: Secondary | ICD-10-CM | POA: Diagnosis not present

## 2014-12-06 DIAGNOSIS — Z48812 Encounter for surgical aftercare following surgery on the circulatory system: Secondary | ICD-10-CM | POA: Diagnosis not present

## 2014-12-06 DIAGNOSIS — I5022 Chronic systolic (congestive) heart failure: Secondary | ICD-10-CM | POA: Diagnosis not present

## 2014-12-06 DIAGNOSIS — F0390 Unspecified dementia without behavioral disturbance: Secondary | ICD-10-CM | POA: Diagnosis not present

## 2014-12-09 DIAGNOSIS — I2510 Atherosclerotic heart disease of native coronary artery without angina pectoris: Secondary | ICD-10-CM | POA: Diagnosis not present

## 2014-12-09 DIAGNOSIS — Z48812 Encounter for surgical aftercare following surgery on the circulatory system: Secondary | ICD-10-CM | POA: Diagnosis not present

## 2014-12-09 DIAGNOSIS — M069 Rheumatoid arthritis, unspecified: Secondary | ICD-10-CM | POA: Diagnosis not present

## 2014-12-09 DIAGNOSIS — I5022 Chronic systolic (congestive) heart failure: Secondary | ICD-10-CM | POA: Diagnosis not present

## 2014-12-09 DIAGNOSIS — F0390 Unspecified dementia without behavioral disturbance: Secondary | ICD-10-CM | POA: Diagnosis not present

## 2014-12-09 DIAGNOSIS — G8929 Other chronic pain: Secondary | ICD-10-CM | POA: Diagnosis not present

## 2014-12-10 ENCOUNTER — Ambulatory Visit (INDEPENDENT_AMBULATORY_CARE_PROVIDER_SITE_OTHER): Payer: Medicare Other | Admitting: Vascular Surgery

## 2014-12-10 ENCOUNTER — Encounter: Payer: Self-pay | Admitting: Vascular Surgery

## 2014-12-10 VITALS — BP 100/66 | HR 98 | Temp 97.9°F | Ht 69.0 in | Wt 196.6 lb

## 2014-12-10 DIAGNOSIS — T81718A Complication of other artery following a procedure, not elsewhere classified, initial encounter: Secondary | ICD-10-CM

## 2014-12-10 DIAGNOSIS — I724 Aneurysm of artery of lower extremity: Secondary | ICD-10-CM

## 2014-12-10 DIAGNOSIS — I9788 Other intraoperative complications of the circulatory system, not elsewhere classified: Secondary | ICD-10-CM

## 2014-12-10 NOTE — Progress Notes (Signed)
Patient resents today for follow-up of repair of right femoral false aneurysm on 11/15/2014. This was following cardiac catheterization. He had attempted thrombin injection and compression which did not) fistula. He then underwent expiration by myself with the repair the artery and evacuation of the hematoma associated with this. He did well discharged home. Is steady recovery from his hospitalization was good today. He is here today with his wife.  His right groin incision is healed quite nicely. There is no evidence of expansile mass. He does have a palpable right posterior tibial pulse  Impression and plan stable status post operative repair of right femoral false aneurysm. We'll continue his usual activities and see [place] again on as-needed basis

## 2014-12-12 DIAGNOSIS — G8929 Other chronic pain: Secondary | ICD-10-CM | POA: Diagnosis not present

## 2014-12-12 DIAGNOSIS — F0390 Unspecified dementia without behavioral disturbance: Secondary | ICD-10-CM | POA: Diagnosis not present

## 2014-12-12 DIAGNOSIS — I5022 Chronic systolic (congestive) heart failure: Secondary | ICD-10-CM | POA: Diagnosis not present

## 2014-12-12 DIAGNOSIS — I2510 Atherosclerotic heart disease of native coronary artery without angina pectoris: Secondary | ICD-10-CM | POA: Diagnosis not present

## 2014-12-12 DIAGNOSIS — Z48812 Encounter for surgical aftercare following surgery on the circulatory system: Secondary | ICD-10-CM | POA: Diagnosis not present

## 2014-12-12 DIAGNOSIS — M069 Rheumatoid arthritis, unspecified: Secondary | ICD-10-CM | POA: Diagnosis not present

## 2014-12-16 DIAGNOSIS — Z48812 Encounter for surgical aftercare following surgery on the circulatory system: Secondary | ICD-10-CM | POA: Diagnosis not present

## 2014-12-16 DIAGNOSIS — F0390 Unspecified dementia without behavioral disturbance: Secondary | ICD-10-CM | POA: Diagnosis not present

## 2014-12-16 DIAGNOSIS — G8929 Other chronic pain: Secondary | ICD-10-CM | POA: Diagnosis not present

## 2014-12-16 DIAGNOSIS — M069 Rheumatoid arthritis, unspecified: Secondary | ICD-10-CM | POA: Diagnosis not present

## 2014-12-16 DIAGNOSIS — I5022 Chronic systolic (congestive) heart failure: Secondary | ICD-10-CM | POA: Diagnosis not present

## 2014-12-16 DIAGNOSIS — I2510 Atherosclerotic heart disease of native coronary artery without angina pectoris: Secondary | ICD-10-CM | POA: Diagnosis not present

## 2014-12-20 DIAGNOSIS — F0390 Unspecified dementia without behavioral disturbance: Secondary | ICD-10-CM | POA: Diagnosis not present

## 2014-12-20 DIAGNOSIS — M069 Rheumatoid arthritis, unspecified: Secondary | ICD-10-CM | POA: Diagnosis not present

## 2014-12-20 DIAGNOSIS — G8929 Other chronic pain: Secondary | ICD-10-CM | POA: Diagnosis not present

## 2014-12-20 DIAGNOSIS — I5022 Chronic systolic (congestive) heart failure: Secondary | ICD-10-CM | POA: Diagnosis not present

## 2014-12-20 DIAGNOSIS — I2510 Atherosclerotic heart disease of native coronary artery without angina pectoris: Secondary | ICD-10-CM | POA: Diagnosis not present

## 2014-12-20 DIAGNOSIS — Z48812 Encounter for surgical aftercare following surgery on the circulatory system: Secondary | ICD-10-CM | POA: Diagnosis not present

## 2014-12-24 ENCOUNTER — Telehealth: Payer: Self-pay

## 2014-12-24 DIAGNOSIS — M069 Rheumatoid arthritis, unspecified: Secondary | ICD-10-CM | POA: Diagnosis not present

## 2014-12-24 DIAGNOSIS — Z48812 Encounter for surgical aftercare following surgery on the circulatory system: Secondary | ICD-10-CM | POA: Diagnosis not present

## 2014-12-24 DIAGNOSIS — G8929 Other chronic pain: Secondary | ICD-10-CM | POA: Diagnosis not present

## 2014-12-24 DIAGNOSIS — F0390 Unspecified dementia without behavioral disturbance: Secondary | ICD-10-CM | POA: Diagnosis not present

## 2014-12-24 DIAGNOSIS — I2510 Atherosclerotic heart disease of native coronary artery without angina pectoris: Secondary | ICD-10-CM | POA: Diagnosis not present

## 2014-12-24 DIAGNOSIS — I5022 Chronic systolic (congestive) heart failure: Secondary | ICD-10-CM | POA: Diagnosis not present

## 2014-12-24 NOTE — Telephone Encounter (Signed)
Called patient canceld appt on 12/28 and offer earlier appt due to NP-Megan being on vacation. R/s appt.

## 2014-12-25 ENCOUNTER — Ambulatory Visit (INDEPENDENT_AMBULATORY_CARE_PROVIDER_SITE_OTHER): Payer: Medicare Other | Admitting: Internal Medicine

## 2014-12-25 ENCOUNTER — Other Ambulatory Visit: Payer: Self-pay | Admitting: Cardiovascular Disease

## 2014-12-25 ENCOUNTER — Encounter: Payer: Self-pay | Admitting: Internal Medicine

## 2014-12-25 VITALS — BP 96/60 | HR 108 | Ht 69.0 in | Wt 195.4 lb

## 2014-12-25 DIAGNOSIS — I2510 Atherosclerotic heart disease of native coronary artery without angina pectoris: Secondary | ICD-10-CM

## 2014-12-25 DIAGNOSIS — I5022 Chronic systolic (congestive) heart failure: Secondary | ICD-10-CM

## 2014-12-25 DIAGNOSIS — I10 Essential (primary) hypertension: Secondary | ICD-10-CM | POA: Diagnosis not present

## 2014-12-25 DIAGNOSIS — R0602 Shortness of breath: Secondary | ICD-10-CM | POA: Diagnosis not present

## 2014-12-25 DIAGNOSIS — Z8673 Personal history of transient ischemic attack (TIA), and cerebral infarction without residual deficits: Secondary | ICD-10-CM

## 2014-12-25 DIAGNOSIS — I442 Atrioventricular block, complete: Secondary | ICD-10-CM

## 2014-12-25 LAB — CUP PACEART INCLINIC DEVICE CHECK
Battery Remaining Longevity: 76.8
Battery Voltage: 2.96 V
Brady Statistic RA Percent Paced: 0.07 %
Brady Statistic RV Percent Paced: 97 %
Date Time Interrogation Session: 20161116165250
Implantable Lead Implant Date: 20160930
Implantable Lead Implant Date: 20160930
Implantable Lead Implant Date: 20160930
Implantable Lead Location: 753858
Implantable Lead Location: 753859
Implantable Lead Location: 753860
Lead Channel Impedance Value: 425 Ohm
Lead Channel Impedance Value: 562.5 Ohm
Lead Channel Impedance Value: 712.5 Ohm
Lead Channel Pacing Threshold Amplitude: 0.375 V
Lead Channel Pacing Threshold Amplitude: 0.875 V
Lead Channel Pacing Threshold Amplitude: 1 V
Lead Channel Pacing Threshold Pulse Width: 0.5 ms
Lead Channel Pacing Threshold Pulse Width: 0.5 ms
Lead Channel Pacing Threshold Pulse Width: 0.8 ms
Lead Channel Sensing Intrinsic Amplitude: 5 mV
Lead Channel Sensing Intrinsic Amplitude: 6.8 mV
Lead Channel Setting Pacing Amplitude: 2 V
Lead Channel Setting Pacing Amplitude: 2 V
Lead Channel Setting Pacing Amplitude: 2 V
Lead Channel Setting Pacing Pulse Width: 0.5 ms
Lead Channel Setting Pacing Pulse Width: 0.8 ms
Lead Channel Setting Sensing Sensitivity: 5 mV
Pulse Gen Serial Number: 7798436

## 2014-12-25 LAB — CBC WITH DIFFERENTIAL/PLATELET
Basophils Absolute: 0 10*3/uL (ref 0.0–0.1)
Basophils Relative: 0 % (ref 0–1)
Eosinophils Absolute: 0.2 10*3/uL (ref 0.0–0.7)
Eosinophils Relative: 2 % (ref 0–5)
HCT: 36 % — ABNORMAL LOW (ref 39.0–52.0)
Hemoglobin: 12.2 g/dL — ABNORMAL LOW (ref 13.0–17.0)
Lymphocytes Relative: 15 % (ref 12–46)
Lymphs Abs: 1.1 10*3/uL (ref 0.7–4.0)
MCH: 32.7 pg (ref 26.0–34.0)
MCHC: 33.9 g/dL (ref 30.0–36.0)
MCV: 96.5 fL (ref 78.0–100.0)
MPV: 9.3 fL (ref 8.6–12.4)
Monocytes Absolute: 0.8 10*3/uL (ref 0.1–1.0)
Monocytes Relative: 11 % (ref 3–12)
Neutro Abs: 5.4 10*3/uL (ref 1.7–7.7)
Neutrophils Relative %: 72 % (ref 43–77)
Platelets: 347 10*3/uL (ref 150–400)
RBC: 3.73 MIL/uL — ABNORMAL LOW (ref 4.22–5.81)
RDW: 13.5 % (ref 11.5–15.5)
WBC: 7.5 10*3/uL (ref 4.0–10.5)

## 2014-12-25 LAB — BASIC METABOLIC PANEL
BUN: 24 mg/dL (ref 7–25)
CO2: 27 mmol/L (ref 20–31)
Calcium: 9.7 mg/dL (ref 8.6–10.3)
Chloride: 100 mmol/L (ref 98–110)
Creat: 1.18 mg/dL — ABNORMAL HIGH (ref 0.70–1.11)
Glucose, Bld: 103 mg/dL — ABNORMAL HIGH (ref 65–99)
Potassium: 4.7 mmol/L (ref 3.5–5.3)
Sodium: 137 mmol/L (ref 135–146)

## 2014-12-25 MED ORDER — RIVAROXABAN 20 MG PO TABS: 20.0000 mg | ORAL_TABLET | Freq: Every day | ORAL | Status: DC

## 2014-12-25 NOTE — Patient Instructions (Signed)
Medication Instructions:  Your physician has recommended you make the following change in your medication:  1) Stop Aggrenox 2) Start Xarelto 20 mg daily    Labwork: Your physician recommends that you return for lab work in: BMP/CBC    Testing/Procedures: Your physician has requested that you have an echocardiogram. Echocardiography is a painless test that uses sound waves to create images of your heart. It provides your doctor with information about the size and shape of your heart and how well your heart's chambers and valves are working. This procedure takes approximately one hour. There are no restrictions for this procedure.  A chest x-ray takes a picture of the organs and structures inside the chest, including the heart, lungs, and blood vessels. This test can show several things, including, whether the heart is enlarges; whether fluid is building up in the lungs; and whether pacemaker / defibrillator leads are still in place.    Follow-Up: Your physician wants you to follow-up in: 35 month with Chanetta Marshall, NP You will receive a reminder letter in the mail two months in advance. If you don't receive a letter, please call our office to schedule the follow-up appointment.   Remote monitoring is used to monitor your Pacemaker  from home. This monitoring reduces the number of office visits required to check your device to one time per year. It allows [place] to keep an eye on the functioning of your device to ensure it is working properly. You are scheduled for a device check from home on 03/26/15. You may send your transmission at any time that day. If you have a wireless device, the transmission will be sent automatically. After your physician reviews your transmission, you will receive a postcard with your next transmission date.    Any Other Special Instructions Will Be Listed Below (If Applicable).     If you need a refill on your cardiac medications before your next appointment,  please call your pharmacy.

## 2014-12-26 ENCOUNTER — Ambulatory Visit
Admission: RE | Admit: 2014-12-26 | Discharge: 2014-12-26 | Disposition: A | Discharge status: Home / Self Care | Payer: Medicare Other | Source: Ambulatory Visit | Attending: Internal Medicine | Admitting: Internal Medicine

## 2014-12-26 DIAGNOSIS — G8929 Other chronic pain: Secondary | ICD-10-CM | POA: Diagnosis not present

## 2014-12-26 DIAGNOSIS — Z48812 Encounter for surgical aftercare following surgery on the circulatory system: Secondary | ICD-10-CM | POA: Diagnosis not present

## 2014-12-26 DIAGNOSIS — R0602 Shortness of breath: Secondary | ICD-10-CM | POA: Diagnosis not present

## 2014-12-26 DIAGNOSIS — H401131 Primary open-angle glaucoma, bilateral, mild stage: Secondary | ICD-10-CM | POA: Diagnosis not present

## 2014-12-26 DIAGNOSIS — I5022 Chronic systolic (congestive) heart failure: Secondary | ICD-10-CM | POA: Diagnosis not present

## 2014-12-26 DIAGNOSIS — M069 Rheumatoid arthritis, unspecified: Secondary | ICD-10-CM | POA: Diagnosis not present

## 2014-12-26 DIAGNOSIS — I2510 Atherosclerotic heart disease of native coronary artery without angina pectoris: Secondary | ICD-10-CM | POA: Diagnosis not present

## 2014-12-26 DIAGNOSIS — F0390 Unspecified dementia without behavioral disturbance: Secondary | ICD-10-CM | POA: Diagnosis not present

## 2014-12-26 LAB — BRAIN NATRIURETIC PEPTIDE: Brain Natriuretic Peptide: 19.5 pg/mL (ref 0.0–100.0)

## 2014-12-27 NOTE — Progress Notes (Signed)
Electrophysiology Office Note   Date:  12/27/2014   ID:  Thomas Long, DOB Oct 14, 1931, MRN [id number]  PCP:  Mathews Argyle, MD  Cardiologist:  Dr Claiborne Billings Primary Electrophysiologist: [name], MD    Chief Complaint  Patient presents with  . Follow-up    cad , Pacer implant     History of Present Illness: Thomas Long is a 81 y.o. male who presents today for electrophysiology evaluation.   He continues to make slow recovery from his prior hospitalization.  He has had fatgue and has not been very active.  He reports SOB with moderate activity.  He also has symptoms of hypotension and dizziness.   Today, he denies symptoms of palpitations, chest pain,  orthopnea, PND, lower extremity edema, claudication, presyncope, syncope, bleeding, or neurologic sequela. The patient is tolerating medications without difficulties and is otherwise without complaint today.    Past Medical History  Diagnosis Date  . Depression   . Hyperlipidemia   . Hypertension   . Chronic pain   . TIA (transient ischemic attack)   . Presence of permanent cardiac pacemaker   . Facial cellulitis 02/19/2009    "related to [facility] & having skin cancer zapped; took him off the Embrel"  . Sleep apnea     "gone since losing weight"  . Hypothyroidism   . Seizures ([place]) 2011    "vs stroke; never determined which it was; on sz RX since"  (11/08/2014)  . Stroke [facility]) 20111    "vs seizure; never determined which it was; on sz RX since"  (11/08/2014)  . Rheumatoid arthritis ([place])     "hands mainly" (11/08/2014)  . Dementia     "memory lapses q now and then" (11/08/2014)  . Complete heart block ([place]) 11/07/2014    a. s/p [facility] Quadra Allure MP RF model [id number] (serial number [id number]) biventricular pacemaker 10/2014.  Marland Kitchen CAD (coronary artery disease)     a. LHC 10/2014: nonobstructive disease, 30% LAD with bridging up to 50%.  Marland Kitchen NICM (nonischemic cardiomyopathy) ([place])     a. 10/2014: EF 45%. (Prev 40-45%  by echo 08/2014).  . Orthostatic hypotension    Past Surgical History  Procedure Laterality Date  . Bi-ventricular pacemaker insertion (crt-p)  11/08/2014  . Tonsillectomy    . Cholecystectomy open  1980's  . Exploratory laparotomy  1990's    "put intestines back in & added mesh"  . Appendectomy  ~ 1988  . Cataract extraction w/ intraocular lens  implant, bilateral Bilateral early 2000's  . Knee cartilage surgery Left 1990's    "opened it up"  . Cardiac catheterization  04/01/09    which showed low normal EF at 50% with question of underlying borderline area of minimal inferoapical hypercontractility. He had mild coronary obstructive disease with coronary calcification and segmental 20% narrowing in the LAD proximally, 20% in the mid LAD with muscle bridging. There was calcification at the ostium of the RCA, and 20 to 30%narrowing of the proximal to mid RCA with 30% distal n  . Cardiac catheterization N/A 11/07/2014    Procedure: Left Heart Cath and Coronary Angiography/ temp wire;  Surgeon: [name], MD;  Location: [place] [facility];  Service: Cardiovascular;  Laterality: N/A;  . Ep implantable device N/A 11/08/2014    Procedure: Pacemaker Implant;  Surgeon: [name], MD; [facility] MP RF model [id number] (serial number [id number]) pacemaker; Laterality: Left  . Ep implantable device N/A 11/10/2014    Procedure:  Lead Revision/Repair;  Surgeon: [name], MD;   . False aneurysm repair Right 11/15/2014    Procedure: REPAIR OF RIGHT FEMORAL FALSE ANEURYSM ;  Surgeon: Rosetta Posner, MD;  Location: [facility] OR;  Service: Vascular;  Laterality: Right;     Current Outpatient Prescriptions  Medication Sig Dispense Refill  . atorvastatin (LIPITOR) 10 MG tablet Take 1 tablet (10 mg total) by mouth daily. 90 tablet 2  . ketoconazole (NIZORAL) 2 % shampoo Apply 1 application topically daily as needed for irritation. AS NEEDED  5  . latanoprost (XALATAN) 0.005 % ophthalmic solution  Place 1 drop into both eyes at bedtime.   3  . levETIRAcetam (KEPPRA XR) 500 MG 24 hr tablet TAKE 1 TABLET BY MOUTH EVERY DAY 90 tablet 1  . loratadine (CLARITIN) 10 MG tablet Take 10 mg by mouth daily.    . memantine (NAMENDA) 10 MG tablet TAKE 1 TABLET TWICE A DAY 180 tablet 0  . Multiple Vitamins-Minerals (CENTRUM SILVER ADULT 50+ PO) Take 1 capsule by mouth daily.    . rivaroxaban (XARELTO) 20 MG TABS tablet Take 1 tablet (20 mg total) by mouth daily with supper. 30 tablet 11   No current facility-administered medications for this visit.    Allergies:   Review of patient's allergies indicates no known allergies.   Social History:  The patient  reports that he has never smoked. He has never used smokeless tobacco. He reports that he does not drink alcohol or use illicit drugs.   Family History:  The patient's family history includes Cancer - Lung in his sister; Heart attack in his father; Thyroid disease in his child.    ROS:  Please see the history of present illness.   All other systems are reviewed and negative.    PHYSICAL EXAM: VS:  BP 96/60 mmHg  Pulse 108  Ht 5\' 9"  (1.753 m)  Wt 195 lb 6.4 oz (88.633 kg)  BMI 28.84 kg/m2  SpO2 96% , BMI Body mass index is 28.84 kg/(m^2). GEN: elderly and chronically ill , in no acute distress HEENT: normal Neck: no JVD, carotid bruits, or masses Cardiac: RRR; no murmurs, rubs, or gallops,+ dependant edema  Respiratory:  clear to auscultation bilaterally, normal work of breathing GI: soft, nontender, nondistended, + BS MS: no deformity or atrophy Skin: warm and dry, device pocket is well healed Neuro:  Strength and sensation are intact Psych: euthymic mood, full affect  Device interrogation is reviewed today in detail.  See PaceArt for details.   Recent Labs: 10/24/2014: ALT 11; TSH 0.944 11/07/2014: Magnesium 2.2 12/25/2014: BUN 24; Creat 1.18*; Hemoglobin 12.2*; Platelets 347; Potassium 4.7; Sodium 137    Lipid Panel       Component Value Date/Time   CHOL 127 10/24/2014 0814   TRIG 113 10/24/2014 0814   HDL 42 10/24/2014 0814   CHOLHDL 3.0 10/24/2014 0814   VLDL 23 10/24/2014 0814   LDLCALC 62 10/24/2014 0814     Wt Readings from Last 3 Encounters:  12/25/14 195 lb 6.4 oz (88.633 kg)  12/10/14 196 lb 9.6 oz (89.177 kg)  11/15/14 206 lb 12.7 oz (93.8 kg)      Other studies Reviewed: Additional studies/ records that were reviewed today include: hospital records   ASSESSMENT AND PLAN:  1.  Complete heart block Normal BiV pacemaker function See Pace Art report No changes today Repeat CXR at this time to ensure adequate lead positioning  2. afib Documented on device Given prior stroke, will  stop aggrenox and start on xarelto 20mg  daily Bmet, cbc today  3. SOB Likely partially due to deconditioning Regular activity encouraged Bmet, cbc, cxr, echo  4. Hypotension Cbc, bmet, echo  5. CHF Will enroll in ICM device clinic echo   Follow-up with Dr Claiborne Billings as scheduled [place] Return to see EP NP in 1 year  Current medicines are reviewed at length with the patient today.   The patient does not have concerns regarding his medicines.  The following changes were made today:  none  Labs/ tests ordered today include:  Orders Placed This Encounter  Procedures  . DG Chest 2 View  . Basic metabolic panel  . CBC with Differential  . Brain natriuretic peptide  . Implantable device check  . EKG 12-Lead  . ECHOCARDIOGRAM COMPLETE     Signed, [name], MD    [address] [place] [address] [phone] (office) [phone] (fax)

## 2014-12-28 ENCOUNTER — Encounter: Payer: Self-pay | Admitting: Internal Medicine

## 2014-12-30 ENCOUNTER — Telehealth: Payer: Self-pay | Admitting: Internal Medicine

## 2014-12-30 DIAGNOSIS — F0390 Unspecified dementia without behavioral disturbance: Secondary | ICD-10-CM | POA: Diagnosis not present

## 2014-12-30 DIAGNOSIS — I5022 Chronic systolic (congestive) heart failure: Secondary | ICD-10-CM | POA: Diagnosis not present

## 2014-12-30 DIAGNOSIS — Z48812 Encounter for surgical aftercare following surgery on the circulatory system: Secondary | ICD-10-CM | POA: Diagnosis not present

## 2014-12-30 DIAGNOSIS — G8929 Other chronic pain: Secondary | ICD-10-CM | POA: Diagnosis not present

## 2014-12-30 DIAGNOSIS — M069 Rheumatoid arthritis, unspecified: Secondary | ICD-10-CM | POA: Diagnosis not present

## 2014-12-30 DIAGNOSIS — I2510 Atherosclerotic heart disease of native coronary artery without angina pectoris: Secondary | ICD-10-CM | POA: Diagnosis not present

## 2014-12-30 NOTE — Telephone Encounter (Signed)
NewMessage  Pt wife calling to speak w/ Rn about xray results. Please call back and discuss.

## 2014-12-31 NOTE — Progress Notes (Signed)
Patient referred to [facility] clinic by Dr Rayann Heman on 12/25/2014.  In office ICM intro given and he agreed to monthly ICM follow to monitor fluid levels.  Enrollment letter sent to patient and date for 1st ICM transmission is 01/23/2015.

## 2014-12-31 NOTE — Telephone Encounter (Signed)
Spoke with wife she is aware of CXR results

## 2015-01-03 DIAGNOSIS — M069 Rheumatoid arthritis, unspecified: Secondary | ICD-10-CM | POA: Diagnosis not present

## 2015-01-03 DIAGNOSIS — I2510 Atherosclerotic heart disease of native coronary artery without angina pectoris: Secondary | ICD-10-CM | POA: Diagnosis not present

## 2015-01-03 DIAGNOSIS — Z48812 Encounter for surgical aftercare following surgery on the circulatory system: Secondary | ICD-10-CM | POA: Diagnosis not present

## 2015-01-03 DIAGNOSIS — I5022 Chronic systolic (congestive) heart failure: Secondary | ICD-10-CM | POA: Diagnosis not present

## 2015-01-03 DIAGNOSIS — G8929 Other chronic pain: Secondary | ICD-10-CM | POA: Diagnosis not present

## 2015-01-03 DIAGNOSIS — F0390 Unspecified dementia without behavioral disturbance: Secondary | ICD-10-CM | POA: Diagnosis not present

## 2015-01-06 ENCOUNTER — Encounter: Payer: Self-pay | Admitting: Adult Health

## 2015-01-06 ENCOUNTER — Ambulatory Visit (INDEPENDENT_AMBULATORY_CARE_PROVIDER_SITE_OTHER): Payer: Medicare Other | Admitting: Adult Health

## 2015-01-06 VITALS — BP 104/62 | HR 68 | Ht 69.0 in | Wt 196.0 lb

## 2015-01-06 DIAGNOSIS — R569 Unspecified convulsions: Secondary | ICD-10-CM

## 2015-01-06 DIAGNOSIS — R413 Other amnesia: Secondary | ICD-10-CM

## 2015-01-06 DIAGNOSIS — I2510 Atherosclerotic heart disease of native coronary artery without angina pectoris: Secondary | ICD-10-CM | POA: Diagnosis not present

## 2015-01-06 NOTE — Patient Instructions (Signed)
Continue Keppra.  If your symptoms worsen or you develop new symptoms please let us know.   

## 2015-01-06 NOTE — Progress Notes (Signed)
I agree with the assessment and plan as directed by NP .The patient is known to me .   Arriana Lohmann, MD  

## 2015-01-06 NOTE — Progress Notes (Signed)
PATIENT: Thomas Long DOB: September 11, 1931  REASON FOR VISIT: follow up- seizures, memory HISTORY FROM: patient  HISTORY OF PRESENT ILLNESS: Thomas Long is an 81 year old male with a history of seizures, stroke and memory loss. He returns today for follow-up visit. The patient continues to take Keppra XL. He denies any seizure events since the last visit. He states that he continues to tolerate the medication well. The patient denies any significant changes in his memory. He continues on Namenda. He does report since the last visit he had a pacemaker placed. He did have some complications after the procedure. 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 Since then he states that he is doing better. He is in physical therapy for his leg weakness. He does state that since undergoing anesthesia that may have affected his memory some. He is able to complete all ADLs independently. His wife states that she supervises just because she does not want him to fall. He does not operate a motor vehicle at this time. He denies any new neurological symptoms. He returns today for follow-up.   HISTORY  Thomas Long is an 81 year old male with a history of seizures, stroke and memory loss. He returns today for follow-up. The patient is currently taking Keppra XL for seizures and tolerating it well. The patient denies any seizure events. The patient is also on Namenda for memory loss. He reports that his memory has remained stable. He continues to operate a motor vehicle without difficulty. Denies having to give up anything due to his memory. He is able to complete all ADLs independently. The patient did have a stroke in 2008. Since then he has not expressed any strokelike symptoms. He is on Aggrenox. His primary care provider manages his blood pressure and cholesterol. Patient denies any new medical issues. He returns today for an evaluation.   HISTORY 12/21/13: Thomas Long is a  81 year old male with a history of Seizures and small stroke. He returns today for follow-up. He is currently taking Keppra XL and tolerating it well. He denies any recent seizure events. His seizure was during the night and it was a grand mal according to the wife. This is the only seizure he has had. They found that he had a small stroke at the same time. He operates a Teacher, music without difficulty. The patient was started on Namenda for memory loss. He reports that his memory has improved with namenda. His wife has noticed improvement as well. Denies having to give up anything due to his memory. He continues to do the finances at home. He has a hard recalling things but eventually it will come back to him. In 2008 his MRI showed a small infarct posterior aspect of the left lenticular nucleus. He is on aggrenox. His PCP Dr. Felipa Eth manages his BP and cholesterol. Patient remains active. He continues to visit "shut-ins" at his church.                                           REVIEW OF SYSTEMS: Out of a complete 14 system review of symptoms, the patient complains only of the following symptoms, and all other reviewed systems are negative.  Bruise easily, memory loss, walking difficulty, frequent waking, activity change  ALLERGIES: No Known Allergies  HOME MEDICATIONS: Outpatient Prescriptions Prior to Visit  Medication Sig Dispense Refill  . atorvastatin (LIPITOR) 10 MG tablet Take 1 tablet (10 mg total) by mouth daily. 90 tablet 2  . ketoconazole (NIZORAL) 2 % shampoo Apply 1 application topically daily as needed for irritation. AS NEEDED  5  . latanoprost (XALATAN) 0.005 % ophthalmic solution Place 1 drop into both eyes at bedtime.   3  . levETIRAcetam (KEPPRA XR) 500 MG 24 hr tablet TAKE 1 TABLET BY MOUTH EVERY DAY 90 tablet 1  . loratadine (CLARITIN) 10 MG tablet Take 10 mg by mouth daily.    . memantine (NAMENDA) 10 MG tablet TAKE 1 TABLET TWICE A DAY 180 tablet 0  . Multiple  Vitamins-Minerals (CENTRUM SILVER ADULT 50+ PO) Take 1 capsule by mouth daily.    . rivaroxaban (XARELTO) 20 MG TABS tablet Take 1 tablet (20 mg total) by mouth daily with supper. 30 tablet 11   No facility-administered medications prior to visit.    PAST MEDICAL HISTORY: Past Medical History  Diagnosis Date  . Depression   . Hyperlipidemia   . Hypertension   . Chronic pain   . TIA (transient ischemic attack)   . Presence of permanent cardiac pacemaker   . Facial cellulitis 02/19/2009    "related to [facility] & having skin cancer zapped; took him off the Embrel"  . Sleep apnea     "gone since losing weight"  . Hypothyroidism   . Seizures ([place]) 2011    "vs stroke; never determined which it was; on sz RX since"  (11/08/2014)  . Stroke [facility]) 20111    "vs seizure; never determined which it was; on sz RX since"  (11/08/2014)  . Rheumatoid arthritis ([place])     "hands mainly" (11/08/2014)  . Dementia     "memory lapses q now and then" (11/08/2014)  . Complete heart block ([place]) 11/07/2014    a. s/p [facility] Quadra Allure MP RF model [id number] (serial number [id number]) biventricular pacemaker 10/2014.  Marland Kitchen CAD (coronary artery disease)     a. LHC 10/2014: nonobstructive disease, 30% LAD with bridging up to 50%.  Marland Kitchen NICM (nonischemic cardiomyopathy) ([place])     a. 10/2014: EF 45%. (Prev 40-45% by echo 08/2014).  . Orthostatic hypotension     PAST SURGICAL HISTORY: Past Surgical History  Procedure Laterality Date  . Bi-ventricular pacemaker insertion (crt-p)  11/08/2014  . Tonsillectomy    . Cholecystectomy open  1980's  . Exploratory laparotomy  1990's    "put intestines back in & added mesh"  . Appendectomy  ~ 1988  . Cataract extraction w/ intraocular lens  implant, bilateral Bilateral early 2000's  . Knee cartilage surgery Left 1990's    "opened it up"  . Cardiac catheterization  04/01/09    which showed low normal EF at 50% with question of underlying borderline area of minimal inferoapical  hypercontractility. He had mild coronary obstructive disease with coronary calcification and segmental 20% narrowing in the LAD proximally, 20% in the mid LAD with muscle bridging. There was calcification at the ostium of the RCA, and 20 to 30%narrowing of the proximal to mid RCA with 30% distal n  . Cardiac catheterization N/A 11/07/2014    Procedure: Left Heart Cath and Coronary Angiography/ temp wire;  Surgeon: [name], MD;  Location: [place] [facility];  Service: Cardiovascular;  Laterality: N/A;  . Ep implantable device N/A 11/08/2014    Procedure: Pacemaker Implant;  Surgeon: [name], MD; [facility] Quadra Allure MP RF model  [id number] (serial number [id number]) pacemaker; Laterality: Left  . Ep implantable device N/A 11/10/2014    Procedure: Lead Revision/Repair;  Surgeon: [name], MD;   . False aneurysm repair Right 11/15/2014    Procedure: REPAIR OF RIGHT FEMORAL FALSE ANEURYSM ;  Surgeon: Rosetta Posner, MD;  Location: [facility] OR;  Service: Vascular;  Laterality: Right;    FAMILY HISTORY: Family History  Problem Relation Age of Onset  . Heart attack Father   . Cancer - Lung Sister   . Thyroid disease Child     SOCIAL HISTORY: Social History   Social History  . Marital Status: Married    Spouse Name: [name]  . Number of Children: 5  . Years of Education: College   Occupational History  . Not on file.   Social History Main Topics  . Smoking status: Never Smoker   . Smokeless tobacco: Never Used  . Alcohol Use: No  . Drug Use: No  . Sexual Activity: Not on file   Other Topics Concern  . Not on file   Social History Narrative   Patient is married [name]) and lives at home with wife and two children.   Patient has five adult children.   Patient is retired.   Patient has a college education.   Patient is right-handed.   Patient drinks two cups of coffee daily, 1/2 can of soda daily and tea- 2-3 times per week.      PHYSICAL EXAM  Filed Vitals:   01/06/15  1018  BP: 104/62  Pulse: 68  Height: 5\' 9"  (1.753 m)  Weight: 196 lb (88.905 kg)   Body mass index is 28.93 kg/(m^2).  MMSE - Mini Mental State Exam 01/06/2015  Orientation to time 3  Orientation to Place 5  Registration 3  Attention/ Calculation 5  Recall 2  Language- name 2 objects 2  Language- repeat 1  Language- follow 3 step command 3  Language- read & follow direction 1  Write a sentence 1  Copy design 0  Total score 26     Generalized: Well developed, in no acute distress   Neurological examination  Mentation: Alert. Follows all commands speech and language fluent Cranial nerve II-XII: Pupils were equal round reactive to light. Extraocular movements were full, visual field were full on confrontational test. Facial sensation and strength were normal. Uvula tongue midline. Head turning and shoulder shrug  were normal and symmetric. Motor: The motor testing reveals 5 over 5 strength of all 4 extremities. Good symmetric motor tone is noted throughout.  Sensory: Sensory testing is intact to soft touch on all 4 extremities. No evidence of extinction is noted.  Coordination: Cerebellar testing reveals good finger-nose-finger and heel-to-shin bilaterally.  Gait and station: uses a cane when ambulating. Reflexes: Deep tendon reflexes are symmetric and normal bilaterally.   DIAGNOSTIC DATA (LABS, IMAGING, TESTING) - I reviewed patient records, labs, notes, testing and imaging myself where available.  Lab Results  Component Value Date   WBC 7.5 12/25/2014   HGB 12.2* 12/25/2014   HCT 36.0* 12/25/2014   MCV 96.5 12/25/2014   PLT 347 12/25/2014      Component Value Date/Time   NA 137 12/25/2014 1605   K 4.7 12/25/2014 1605   CL 100 12/25/2014 1605   CO2 27 12/25/2014 1605   GLUCOSE 103* 12/25/2014 1605   BUN 24 12/25/2014 1605   CREATININE 1.18* 12/25/2014 1605   CREATININE 0.82 11/16/2014 0234   CALCIUM 9.7 12/25/2014 1605   PROT  6.1 10/24/2014 0814   ALBUMIN 3.7  10/24/2014 0814   AST 18 10/24/2014 0814   ALT 11 10/24/2014 0814   ALKPHOS 49 10/24/2014 0814   BILITOT 0.8 10/24/2014 0814   GFRNONAA >60 11/16/2014 0234   GFRAA >60 11/16/2014 0234   Lab Results  Component Value Date   CHOL 127 10/24/2014   HDL 42 10/24/2014   LDLCALC 62 10/24/2014   TRIG 113 10/24/2014   CHOLHDL 3.0 10/24/2014    Lab Results  Component Value Date   TSH 0.944 10/24/2014      ASSESSMENT AND PLAN 81 y.o. year old male  has a past medical history of Depression; Hyperlipidemia; Hypertension; Chronic pain; TIA (transient ischemic attack); Presence of permanent cardiac pacemaker; Facial cellulitis (02/19/2009); Sleep apnea; Hypothyroidism; Seizures ([place]) (2011); Stroke [facility]) (20111); Rheumatoid arthritis ([place]); Dementia; Complete heart block ([facility]) (11/07/2014); CAD (coronary artery disease); NICM (nonischemic cardiomyopathy) ([place]); and Orthostatic hypotension. here with;  1. Seizures 2. Memory loss  Overall the patient is doing well. He will continue on Keppra XL. Patient memory score is slightly decreased this visit. MMSE is 26/30 was previously 29/30. He will continue on Namenda. We will continue to monitor his memory. Patient advised that if his symptoms worsen or he develops any new symptoms he should let us know. He will follow-up in 6 months with Dr. Brett Fairy.   Ward Givens, MSN, NP-C 01/06/2015, 10:42 AM [facility] [address], [place], [place] [address] [phone]

## 2015-01-07 ENCOUNTER — Other Ambulatory Visit: Payer: Self-pay

## 2015-01-07 ENCOUNTER — Telehealth: Payer: Self-pay | Admitting: Internal Medicine

## 2015-01-07 ENCOUNTER — Ambulatory Visit (HOSPITAL_COMMUNITY): Payer: Medicare Other | Attending: Internal Medicine

## 2015-01-07 DIAGNOSIS — I517 Cardiomegaly: Secondary | ICD-10-CM | POA: Diagnosis not present

## 2015-01-07 DIAGNOSIS — R0602 Shortness of breath: Secondary | ICD-10-CM

## 2015-01-07 DIAGNOSIS — I442 Atrioventricular block, complete: Secondary | ICD-10-CM | POA: Diagnosis not present

## 2015-01-07 DIAGNOSIS — E785 Hyperlipidemia, unspecified: Secondary | ICD-10-CM | POA: Diagnosis not present

## 2015-01-07 DIAGNOSIS — I340 Nonrheumatic mitral (valve) insufficiency: Secondary | ICD-10-CM | POA: Insufficient documentation

## 2015-01-07 DIAGNOSIS — R0600 Dyspnea, unspecified: Secondary | ICD-10-CM | POA: Diagnosis present

## 2015-01-07 MED ORDER — RIVAROXABAN 20 MG PO TABS: 20.0000 mg | ORAL_TABLET | Freq: Every day | ORAL | Status: DC

## 2015-01-07 NOTE — Telephone Encounter (Signed)
Walk in pt form-pt needs prescription changed-gave to [facility] for the ok.

## 2015-01-08 DIAGNOSIS — G8929 Other chronic pain: Secondary | ICD-10-CM | POA: Diagnosis not present

## 2015-01-08 DIAGNOSIS — M069 Rheumatoid arthritis, unspecified: Secondary | ICD-10-CM | POA: Diagnosis not present

## 2015-01-08 DIAGNOSIS — F0390 Unspecified dementia without behavioral disturbance: Secondary | ICD-10-CM | POA: Diagnosis not present

## 2015-01-08 DIAGNOSIS — Z48812 Encounter for surgical aftercare following surgery on the circulatory system: Secondary | ICD-10-CM | POA: Diagnosis not present

## 2015-01-08 DIAGNOSIS — I5022 Chronic systolic (congestive) heart failure: Secondary | ICD-10-CM | POA: Diagnosis not present

## 2015-01-08 DIAGNOSIS — I2510 Atherosclerotic heart disease of native coronary artery without angina pectoris: Secondary | ICD-10-CM | POA: Diagnosis not present

## 2015-01-10 DIAGNOSIS — Z48812 Encounter for surgical aftercare following surgery on the circulatory system: Secondary | ICD-10-CM | POA: Diagnosis not present

## 2015-01-10 DIAGNOSIS — F0390 Unspecified dementia without behavioral disturbance: Secondary | ICD-10-CM | POA: Diagnosis not present

## 2015-01-10 DIAGNOSIS — I5022 Chronic systolic (congestive) heart failure: Secondary | ICD-10-CM | POA: Diagnosis not present

## 2015-01-10 DIAGNOSIS — G8929 Other chronic pain: Secondary | ICD-10-CM | POA: Diagnosis not present

## 2015-01-10 DIAGNOSIS — M069 Rheumatoid arthritis, unspecified: Secondary | ICD-10-CM | POA: Diagnosis not present

## 2015-01-10 DIAGNOSIS — I2510 Atherosclerotic heart disease of native coronary artery without angina pectoris: Secondary | ICD-10-CM | POA: Diagnosis not present

## 2015-01-14 DIAGNOSIS — I10 Essential (primary) hypertension: Secondary | ICD-10-CM | POA: Diagnosis not present

## 2015-01-15 DIAGNOSIS — I5022 Chronic systolic (congestive) heart failure: Secondary | ICD-10-CM | POA: Diagnosis not present

## 2015-01-15 DIAGNOSIS — G8929 Other chronic pain: Secondary | ICD-10-CM | POA: Diagnosis not present

## 2015-01-15 DIAGNOSIS — M069 Rheumatoid arthritis, unspecified: Secondary | ICD-10-CM | POA: Diagnosis not present

## 2015-01-15 DIAGNOSIS — F0390 Unspecified dementia without behavioral disturbance: Secondary | ICD-10-CM | POA: Diagnosis not present

## 2015-01-15 DIAGNOSIS — I2510 Atherosclerotic heart disease of native coronary artery without angina pectoris: Secondary | ICD-10-CM | POA: Diagnosis not present

## 2015-01-15 DIAGNOSIS — Z48812 Encounter for surgical aftercare following surgery on the circulatory system: Secondary | ICD-10-CM | POA: Diagnosis not present

## 2015-01-16 DIAGNOSIS — I2510 Atherosclerotic heart disease of native coronary artery without angina pectoris: Secondary | ICD-10-CM | POA: Diagnosis not present

## 2015-01-16 DIAGNOSIS — F0390 Unspecified dementia without behavioral disturbance: Secondary | ICD-10-CM | POA: Diagnosis not present

## 2015-01-16 DIAGNOSIS — Z48812 Encounter for surgical aftercare following surgery on the circulatory system: Secondary | ICD-10-CM | POA: Diagnosis not present

## 2015-01-16 DIAGNOSIS — M069 Rheumatoid arthritis, unspecified: Secondary | ICD-10-CM | POA: Diagnosis not present

## 2015-01-16 DIAGNOSIS — I5022 Chronic systolic (congestive) heart failure: Secondary | ICD-10-CM | POA: Diagnosis not present

## 2015-01-16 DIAGNOSIS — G8929 Other chronic pain: Secondary | ICD-10-CM | POA: Diagnosis not present

## 2015-01-17 ENCOUNTER — Telehealth: Payer: Self-pay | Admitting: Internal Medicine

## 2015-01-17 NOTE — Telephone Encounter (Signed)
New message ° ° ° ° ° °Calling for echo results °

## 2015-01-17 NOTE — Telephone Encounter (Signed)
Spoke with patient's wife and gave her his echo results

## 2015-01-23 ENCOUNTER — Ambulatory Visit (INDEPENDENT_AMBULATORY_CARE_PROVIDER_SITE_OTHER): Payer: Medicare Other

## 2015-01-23 DIAGNOSIS — I5022 Chronic systolic (congestive) heart failure: Secondary | ICD-10-CM

## 2015-01-24 NOTE — Progress Notes (Signed)
Telephone call to patient for 1st ICM encounter.  2 ICM transmissions received but received message stating no reports are available at this time because the device has temporarily suspended the use of RF/high speed telemetry in order to more accurately assess the remaining battery capacity.   Transmission rescheduled for 02/12/2015 and patient notified.  Letter sent with new transmission date.  Patient denied any HF symptoms at this time.

## 2015-02-03 ENCOUNTER — Other Ambulatory Visit: Payer: Self-pay | Admitting: Neurology

## 2015-02-05 ENCOUNTER — Ambulatory Visit: Payer: 59 | Admitting: Adult Health

## 2015-02-18 ENCOUNTER — Telehealth: Payer: Self-pay

## 2015-02-18 ENCOUNTER — Encounter: Payer: Self-pay | Admitting: Cardiovascular Disease

## 2015-02-18 ENCOUNTER — Ambulatory Visit (INDEPENDENT_AMBULATORY_CARE_PROVIDER_SITE_OTHER): Payer: Medicare Other | Admitting: Cardiovascular Disease

## 2015-02-18 VITALS — BP 100/68 | HR 93 | Ht 70.0 in | Wt 198.1 lb

## 2015-02-18 DIAGNOSIS — E785 Hyperlipidemia, unspecified: Secondary | ICD-10-CM | POA: Diagnosis not present

## 2015-02-18 DIAGNOSIS — I2510 Atherosclerotic heart disease of native coronary artery without angina pectoris: Secondary | ICD-10-CM

## 2015-02-18 DIAGNOSIS — I442 Atrioventricular block, complete: Secondary | ICD-10-CM | POA: Diagnosis not present

## 2015-02-18 DIAGNOSIS — R0789 Other chest pain: Secondary | ICD-10-CM

## 2015-02-18 DIAGNOSIS — Z7901 Long term (current) use of anticoagulants: Secondary | ICD-10-CM

## 2015-02-18 DIAGNOSIS — I25810 Atherosclerosis of coronary artery bypass graft(s) without angina pectoris: Secondary | ICD-10-CM | POA: Diagnosis not present

## 2015-02-18 DIAGNOSIS — I480 Paroxysmal atrial fibrillation: Secondary | ICD-10-CM

## 2015-02-18 NOTE — Patient Instructions (Signed)
Your physician wants you to follow-up in: 6 months or sooner if needed. You will receive a reminder letter in the mail two months in advance. If you don't receive a letter, please call our office to schedule the follow-up appointment.   If you need a refill on your cardiac medications before your next appointment, please call your pharmacy. 

## 2015-02-18 NOTE — Telephone Encounter (Signed)
Attempted ICM call to patient to inform remote transmission is scheduled for 02/28/2015.  No answer.

## 2015-02-19 ENCOUNTER — Encounter: Payer: Self-pay | Admitting: Cardiovascular Disease

## 2015-02-19 DIAGNOSIS — I480 Paroxysmal atrial fibrillation: Secondary | ICD-10-CM | POA: Insufficient documentation

## 2015-02-19 DIAGNOSIS — Z7901 Long term (current) use of anticoagulants: Secondary | ICD-10-CM | POA: Insufficient documentation

## 2015-02-19 NOTE — Progress Notes (Signed)
Patient ID: Thomas Long, male   DOB: [birth year], 82 y.o.   MRN: [id number]    HPI: Thomas Long is a 82 y.o. male who presents to the office today for a 4 month followup cardiology evaluation following his recent hospitalization.    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  In 2011 cardiac catheterization revealed ejection fraction at 50% with mild coronary artery disease coronary calcification with segmental 20% narrowing in the proximal LAD and mid LAD with mild muscle bridging, calcification at the ostium of the right coronary artery with 20-30% narrowing in the proximal to mid RCA and 30% distal narrowing. He has a history of a TIA with possible seizure and has been on Aggrenox and Keppra and is currently followed by neurology. An echo Doppler study in April 2012 showed an ejection fraction of 50% with inferior hypokinesis, mild mitral annular calcification and mild TR, mild aortic sclerosis without stenosis. I had seen him on 05/30/2012 with complaints of increased fatigability as well as shortness of breath with less activity.  A two-year followup echo Doppler study on 07/06/2012 which showed mild LVH. Ejection fraction was 50-55% and again there was mild hypokinesis of the basal mid inferior myocardium and  grade 1 diastolic dysfunction. He had minimal pulmonary hypertension with PA pressure 32 mm, mitral annular calcification with trivial MR. His aortic valve was mildly sclerotic without stenosis.  When I saw on 08/01/2012 he had orthostatic symptoms and was hypotensive. At that time, I weaned slowly and ultimately discontinued his Bystolic. He felt improved off this therapy.  He has been without exertional chest pain.  He does admit to some aching nonexertional chest pain, particularly when he lies down, which lasts seconds to minutes. He does note some shortness of breath particularly with activity.  He denies any recent seizure activity.  He has had some difficulty  with early memory loss and has been started on Namenda 10 mg twice a day by neurology.  He continues to take atorvastatin 10 mg for hyperlipidemia.  He is unaware of palpitations.   He underwent a follow-up echo Doppler study on 08/29/2014.  This showed normal LV cavity size, but his LV function has slightly declined and was now 40-45% without segmental wall motion abnormality.  There also was felt to be mild the reduced RV systolic function.  He has had issues with low blood pressure.  He  saw Karrie Doffing, PA in Dr. Stefani Dama office.  He has not had any seizure activity but continues to be on Keppra.  He does have some short-term memory issues, which seem to be exacerbated by being overtired.  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  He no longer does visitation at the hospital due to his inability to walk distances without developing  shortness of breath.  He denies any chest pain.  He denies any awareness of arrhythmia.  He denies any bleeding.    Since I last saw him in the office in early September, he was admitted to [facility] hospital on 11/07/2014 with symptomatic complete heart block.  Upon presenting to the emergency room, his heart rate was in the 30s.  He had experienced several episodes of recurrent chest pain.  He was brought semi-urgently to the cardiac catheterization laboratory in cardiac catheterization was performed by me without difficulty.  This revealed no significant obstructive CAD but there was mild mid systolic bridging in the mid LAD with  narrowing up to 50% during systole.  He had a normal left circumflex coronary artery and there was calcification of the ostium of the RCA without significant stenosis and a very large dominant RCA vessel.  He had mild global LV dysfunction with an ejection fraction at 45%.  There was extensive mitral annular calcification.  He underwent insertion of a temporary transvenous pacemaker for his underlying  complete heart block.  The following day he underwent permanent pacemaker insertion by Dr. Rayann Heman and had a [employer] biventricular pacemaker implantation without difficulty.  Unfortunately, on October 2 1 back to the EP lab due to right atrial lead dislodgment and a new right atrial lead was placed and it was repositioning of a previously implanted right ventricular lead.  He developed a right groin pseudoaneurysm and underwent repair by Dr. Sherren Mocha Early on 11/15/2014.  He saw Dr. Rayann Heman back in the office on 01/07/2015.  Interrogation of his device revealed paroxysmal atrial fibrillation.  As result, his Aggrenox was discontinued and he was started on Xarelto 20 mg daily.  A follow-up echo Doppler study now showed normalization of LV function with an ejection fraction of 55-60%.  There was grade 2 diastolic dysfunction.  There was mild mitral regurgitation and mild dilatation of his left atrium.  Presently he denies chest pain, PND orthopnea.  He denies bleeding. He has been walking with a cane.  He presents for evaluation  Past Medical History  Diagnosis Date  . Depression   . Hyperlipidemia   . Hypertension   . Chronic pain   . TIA (transient ischemic attack)   . Presence of permanent cardiac pacemaker   . Facial cellulitis 02/19/2009    "related to [facility] & having skin cancer zapped; took him off the Embrel"  . Sleep apnea     "gone since losing weight"  . Hypothyroidism   . Seizures ([place]) 2011    "vs stroke; never determined which it was; on sz RX since"  (11/08/2014)  . Stroke [facility]) 20111    "vs seizure; never determined which it was; on sz RX since"  (11/08/2014)  . Rheumatoid arthritis ([place])     "hands mainly" (11/08/2014)  . Dementia     "memory lapses q now and then" (11/08/2014)  . Complete heart block ([place]) 11/07/2014    a. s/p [facility] Quadra Allure MP RF model [id number] (serial number [id number]) biventricular pacemaker 10/2014.  Marland Kitchen CAD (coronary artery disease)     a. LHC 10/2014:  nonobstructive disease, 30% LAD with bridging up to 50%.  Marland Kitchen NICM (nonischemic cardiomyopathy) ([place])     a. 10/2014: EF 45%. (Prev 40-45% by echo 08/2014).  . Orthostatic hypotension     Past Surgical History  Procedure Laterality Date  . Bi-ventricular pacemaker insertion (crt-p)  11/08/2014  . Tonsillectomy    . Cholecystectomy open  1980's  . Exploratory laparotomy  1990's    "put intestines back in & added mesh"  . Appendectomy  ~ 1988  . Cataract extraction w/ intraocular lens  implant, bilateral Bilateral early 2000's  . Knee cartilage surgery Left 1990's    "opened it up"  . Cardiac catheterization  04/01/09    which showed low normal EF at 50% with question of underlying borderline area of minimal inferoapical hypercontractility. He had mild coronary obstructive disease with coronary calcification and segmental 20% narrowing in the LAD proximally, 20% in the mid LAD with muscle bridging. There was calcification at the ostium of the RCA, and 20  to 30%narrowing of the proximal to mid RCA with 30% distal n  . Cardiac catheterization N/A 11/07/2014    Procedure: Left Heart Cath and Coronary Angiography/ temp wire;  Surgeon: [name], MD;  Location: [place] [facility];  Service: Cardiovascular;  Laterality: N/A;  . Ep implantable device N/A 11/08/2014    Procedure: Pacemaker Implant;  Surgeon: [name], MD; [facility] MP RF model [id number] (serial number [id number]) pacemaker; Laterality: Left  . Ep implantable device N/A 11/10/2014    Procedure: Lead Revision/Repair;  Surgeon: [name], MD;   . False aneurysm repair Right 11/15/2014    Procedure: REPAIR OF RIGHT FEMORAL FALSE ANEURYSM ;  Surgeon: Rosetta Posner, MD;  Location: [facility] OR;  Service: Vascular;  Laterality: Right;    No Known Allergies  Current Outpatient Prescriptions  Medication Sig Dispense Refill  . atorvastatin (LIPITOR) 10 MG tablet Take 1 tablet (10 mg total) by mouth daily. 90 tablet 2  .  ketoconazole (NIZORAL) 2 % shampoo Apply 1 application topically daily as needed for irritation. AS NEEDED  5  . latanoprost (XALATAN) 0.005 % ophthalmic solution Place 1 drop into both eyes at bedtime.   3  . levETIRAcetam (KEPPRA XR) 500 MG 24 hr tablet TAKE 1 TABLET BY MOUTH EVERY DAY 90 tablet 1  . loratadine (CLARITIN) 10 MG tablet Take 10 mg by mouth daily.    . memantine (NAMENDA) 10 MG tablet TAKE 1 TABLET TWICE A DAY 180 tablet 1  . Multiple Vitamins-Minerals (CENTRUM SILVER ADULT 50+ PO) Take 1 capsule by mouth daily.    . rivaroxaban (XARELTO) 20 MG TABS tablet Take 1 tablet (20 mg total) by mouth daily with supper. 90 tablet 3   No current facility-administered medications for this visit.    Socially he is married has 5 children 6 grandchildren. He typically goes to bed approximately 8:30 at night and wakes up at approximately 4 AM in the morning. In the early morning he spends time in prayer and then typically attends 7 AM Mass. Since I last saw him he is retired from his [employer].  ROS General: Negative; No fevers, chills, or night sweats;  HEENT: Negative; No changes in vision or hearing, sinus congestion, difficulty swallowing Pulmonary: Negative; No cough, wheezing, shortness of breath, hemoptysis Cardiovascular: See history of present illness GI: Negative; No nausea, vomiting, diarrhea, or abdominal pain GU: Negative; No dysuria, hematuria, or difficulty voiding Musculoskeletal: Negative; no myalgias, joint pain, or weakness Hematologic/Oncology: Negative; no easy bruising, bleeding Endocrine: Negative; no heat/cold intolerance; no diabetes Neuro: No recent seizures. Skin: Negative; No rashes or skin lesions Psychiatric: Negative; No behavioral problems, depression Sleep: Negative; No snoring, daytime sleepiness, hypersomnolence, bruxism, restless legs, hypnogognic hallucinations, no cataplexy Other comprehensive 14 point system review is negative.   PE BP  100/68 mmHg  Pulse 93  Ht '5\' 10"'  (1.778 m)  Wt 198 lb 1.6 oz (89.858 kg)  BMI 28.42 kg/m2    Repeat blood pressure by me was 100/60.  Wt Readings from Last 3 Encounters:  02/18/15 198 lb 1.6 oz (89.858 kg)  01/06/15 196 lb (88.905 kg)  12/25/14 195 lb 6.4 oz (88.633 kg)   General: Alert, oriented, no distress.  Skin: normal turgor, no rashes HEENT: Normocephalic, atraumatic. Pupils round and reactive; sclera anicteric;no lid lag.  Nose without nasal septal hypertrophy no xanthelasmas Mouth/Parynx benign; Mallinpatti scale 2 Neck: No JVD, no carotid bruits with normal carotid upstroke. Chest wall: Nontender to palpation  Lungs: clear to ausculatation and percussion; no wheezing or rales Heart: RRR, s1 s2 normal  1/6 systolic murmur, unchanged. No diastolic murmur. No rubs thrills or heaves Abdomen: soft, nontender; no hepatosplenomehaly, BS+; abdominal aorta nontender and not dilated by palpation. mild central adiposity.  Back: No CVA tenderness Pulses 2+; well-healed incision in his right groin following pseudoaneurysm repair Extremities: no clubbing cyanosis or edema, Homan's sign negative  Neurologic: grossly nonfocal Psychological: Normal affect and mood  ECG (independently read by me): Paced rhythm at 93 bpm.  Possible underlying A. fib.  ECG (independently read by me): Sinus tachycardia at 10 7 bpm.  First degree AV block with a PR interval at 224 ms.  Mild RV conduction delay.  No ectopy  May 2016 ECG (independently read by me): Sinus rhythm at 98 bpm.  Occasional PVC.  No ST segment changes.  QTc interval 472 ms.  First-degree AV block with a PR interval at 230 ms.  March 2015 ECG (independently read by me): Sinus tachycardia 103 beats per minute with one PAC. First degree AV block. RV conduction delay.  Prior 11/02/2012 ECG: Normal sinus rhythm at 94beats per minute with a rare PAC. Borderline first-degree block; PR 210 ms. nonspecific T changes.  LABS: BMP Latest Ref  Rng 12/25/2014 11/16/2014 11/15/2014  Glucose 65 - 99 mg/dL 103(H) 119(H) 142(H)  BUN 7 - 25 mg/dL '24 16 16  ' Creatinine 0.70 - 1.11 mg/dL 1.18(H) 0.82 0.90  Sodium 135 - 146 mmol/L 137 136 137  Potassium 3.5 - 5.3 mmol/L 4.7 4.5 4.2  Chloride 98 - 110 mmol/L 100 103 101  CO2 20 - 31 mmol/L '27 27 27  ' Calcium 8.6 - 10.3 mg/dL 9.7 8.1(L) 8.9   Hepatic Function Latest Ref Rng 10/24/2014 04/25/2013  Total Protein 6.1 - 8.1 g/dL 6.1 6.8  Albumin 3.6 - 5.1 g/dL 3.7 4.1  AST 10 - 35 U/L 18 20  ALT 9 - 46 U/L 11 13  Alk Phosphatase 40 - 115 U/L 49 51  Total Bilirubin 0.2 - 1.2 mg/dL 0.8 0.9   CBC Latest Ref Rng 12/25/2014 11/16/2014 11/15/2014  WBC 4.0 - 10.5 K/uL 7.5 11.4(H) 13.2(H)  Hemoglobin 13.0 - 17.0 g/dL 12.2(L) 9.9(L) 11.6(L)  Hematocrit 39.0 - 52.0 % 36.0(L) 30.0(L) 34.9(L)  Platelets 150 - 400 K/uL 347 291 320   Lab Results  Component Value Date   MCV 96.5 12/25/2014   MCV 98.4 11/16/2014   MCV 97.8 11/15/2014   Lab Results  Component Value Date   TSH 0.944 10/24/2014  No results found for: HGBA1C  Lipid Panel     Component Value Date/Time   CHOL 127 10/24/2014 0814   TRIG 113 10/24/2014 0814   HDL 42 10/24/2014 0814   CHOLHDL 3.0 10/24/2014 0814   VLDL 23 10/24/2014 0814   LDLCALC 62 10/24/2014 0814   RADIOLOGY: No results found.    ASSESSMENT AND PLAN: Thomas Long is an 82 year old white male has a history of documented mild CAD and previously was on beta blocker therapy.  Beta blocker had been discontinued due to  previous orthostatic hypotension and marked fatigability.  Since I last saw him in the office, he developed symptomatic complete heart block with heart rates in the 30s in the setting also of the mild recurrent chest pain.  Cardiac catheterization did not demonstrate significant obstructive disease and his LV function was reduced initially at 45%.  He ultimately underwent insertion of a biventricular pacemaker and several days later required lead revision  due  to atrial lead dislodgment.  His hospital course was complicated by the development of a pseudoaneurysm and he underwent successful repair by Dr. Donnetta Hutching.  He has been found to have paroxysmal atrial fibrillation on monitoring and for this reason is now on full dose anticoagulation with Xarelto.  He is now walking with a cane.  His recent echo Doppler study has shown normalization of LV function with grade 2 diastolic dysfunction.  He has seen the neurologist recently and he is stable from that perspective without recurrent seizure activity.  He continues to be on atorvastatin at 10 mg for mild hyperlipidemia.  He is on Namenda for memory issues.  His groin site is stable following pseudoaneurysm repair.  He will be having a pacemaker interrogation remotely in February.  I will see him in 6 months for cardiology reevaluation or sooner if problems arise.     [name], MD, [facility]  02/19/2015 5:18 PM

## 2015-02-28 ENCOUNTER — Ambulatory Visit (INDEPENDENT_AMBULATORY_CARE_PROVIDER_SITE_OTHER): Payer: Medicare Other

## 2015-02-28 ENCOUNTER — Telehealth: Payer: Self-pay | Admitting: Cardiology

## 2015-02-28 DIAGNOSIS — Z950 Presence of cardiac pacemaker: Secondary | ICD-10-CM

## 2015-02-28 DIAGNOSIS — I5022 Chronic systolic (congestive) heart failure: Secondary | ICD-10-CM | POA: Diagnosis not present

## 2015-02-28 NOTE — Telephone Encounter (Signed)
LMOVM reminding pt to send remote transmission.   

## 2015-03-03 NOTE — Progress Notes (Signed)
EPIC Encounter for ICM Monitoring  Patient Name: Thomas Long is a 82 y.o. male Date: 03/03/2015 Primary Care Physican: Mathews Argyle, MD Primary Cardiologist: Claiborne Billings Electrophysiologist: Allred Dry Weight: Does not weigh at home       In the past month, have you:  1. Gained more than 2 pounds in a day or more than 5 pounds in a week? Unknown, patient has a home scale and encouraged him to weigh daily at same time with same amount of clothes.  Advised if gain of 2-3 pounds overnight or 5 pounds in a week.    2. Had changes in your medications (with verification of current medications)? no  3. Had more shortness of breath than is usual for you? no  4. Limited your activity because of shortness of breath? no  5. Not been able to sleep because of shortness of breath? no  6. Had increased swelling in your feet or ankles? no  7. Had symptoms of dehydration (dizziness, dry mouth, increased thirst, decreased urine output) no  8. Had changes in sodium restriction? no  9. Been compliant with medication? Yes   ICM trend: 3 month view for 02/28/2015   ICM trend: 1 year view for 02/28/2015         Follow-up plan: ICM clinic phone appointment 04/03/2015.  Corvue thoracic impedance below reference line starting 02/25/2015 to 02/28/2015 suggesting fluid retention.   He reported a slight increase in SOB last week but feels fine today.  He is not prescribed a diuretic.  Encouraged him to eat low salt diet.  No changes today.    Copy of note sent to patient's primary care physician, primary cardiologist, and device following physician.  Rosalene Billings, RN, CCM 03/03/2015 10:16 AM

## 2015-03-26 ENCOUNTER — Other Ambulatory Visit: Payer: Self-pay | Admitting: Neurology

## 2015-04-03 ENCOUNTER — Telehealth: Payer: Self-pay | Admitting: Internal Medicine

## 2015-04-03 ENCOUNTER — Ambulatory Visit (INDEPENDENT_AMBULATORY_CARE_PROVIDER_SITE_OTHER): Payer: Medicare Other | Admitting: *Deleted

## 2015-04-03 DIAGNOSIS — Z950 Presence of cardiac pacemaker: Secondary | ICD-10-CM | POA: Diagnosis not present

## 2015-04-03 DIAGNOSIS — I5022 Chronic systolic (congestive) heart failure: Secondary | ICD-10-CM | POA: Diagnosis not present

## 2015-04-03 DIAGNOSIS — I442 Atrioventricular block, complete: Secondary | ICD-10-CM

## 2015-04-03 NOTE — Progress Notes (Signed)
Remote pacemaker transmission.   

## 2015-04-03 NOTE — Telephone Encounter (Signed)
Please call, had some concerns. His pulse rate was 118 and now is 108. His oxygen level this morning was 97 and now it is 92.Blood pressure was 85/59 and now it 90/52.Please call to advise.

## 2015-04-03 NOTE — Telephone Encounter (Signed)
Patient's wife called concerned because patient's HR is elevated and BP low. This AM, BP=88/52 and HR 118. Most recent readings were BP = 90/52 and HR 108. This was taken after a 4 hour nap. Caller st the patient was a little bit woozy but still waking up. She has no other symptoms to report. She st the patient has not had anything to drink. Encouraged her to give patient some water - he may be a little dehydrated causing low BP and increased HR.  Patient's wife requests a call from [facility] tomorrow after 0900 to clarify general questions about patient's device.   To [facility] and Dr. Rayann Heman.

## 2015-04-03 NOTE — Telephone Encounter (Signed)
New message      Calling to see what is the highest his bp and pulse should be before he calls the doctor.  He has a pacemaker.

## 2015-04-04 ENCOUNTER — Telehealth: Payer: Self-pay

## 2015-04-04 NOTE — Telephone Encounter (Signed)
Remote ICM transmission received.  Attempted patient call and left message for return call.   

## 2015-04-04 NOTE — Telephone Encounter (Signed)
Spoke with Thomas Long- explained pacemaker function, monitoring/transmission process. She verbalizes understanding and is appreciative. Next remote transmission 07/03/15.

## 2015-04-18 NOTE — Progress Notes (Signed)
EPIC Encounter for ICM Monitoring  Patient Name: Thomas Long is a 82 y.o. male Date: 04/18/2015 Primary Care Physican: Mathews Argyle, MD Primary Cardiologist: Claiborne Billings Electrophysiologist: Allred Dry Weight: unknown   Bi-V Pacing 98%      In the past month, have you:  1. Gained more than 2 pounds in a day or more than 5 pounds in a week? N/A  2. Had changes in your medications (with verification of current medications)? N/A  3. Had more shortness of breath than is usual for you? N/A  4. Limited your activity because of shortness of breath? N/A  5. Not been able to sleep because of shortness of breath? N/A  6. Had increased swelling in your feet or ankles? N/A  7. Had symptoms of dehydration (dizziness, dry mouth, increased thirst, decreased urine output) N/A  8. Had changes in sodium restriction? N/A  9. Been compliant with medication? N/A   ICM trend: 3 month view for 04/04/2015  ICM trend: 1 year view for 04/04/2015   Follow-up plan: ICM clinic phone appointment on 05/19/2015.  Unable to reach patient and transmission reviewed.  Thoracic impedance trending along reference line suggesting stable fluid levels.  No changes.   Rosalene Billings, RN, CCM 04/18/2015 12:38 PM

## 2015-04-29 LAB — CUP PACEART REMOTE DEVICE CHECK
Brady Statistic RA Percent Paced: 1 %
Brady Statistic RV Percent Paced: 98 %
Date Time Interrogation Session: 20170321140512
Implantable Lead Implant Date: 20160930
Implantable Lead Implant Date: 20160930
Implantable Lead Implant Date: 20160930
Implantable Lead Location: 753858
Implantable Lead Location: 753859
Implantable Lead Location: 753860
Lead Channel Setting Pacing Amplitude: 2 V
Lead Channel Setting Pacing Amplitude: 2 V
Lead Channel Setting Pacing Amplitude: 2 V
Lead Channel Setting Pacing Pulse Width: 0.5 ms
Lead Channel Setting Pacing Pulse Width: 0.8 ms
Lead Channel Setting Sensing Sensitivity: 5 mV
Pulse Gen Serial Number: 7798436

## 2015-04-30 ENCOUNTER — Encounter: Payer: Self-pay | Admitting: Cardiology

## 2015-05-16 ENCOUNTER — Encounter: Payer: Self-pay | Admitting: Cardiology

## 2015-05-19 ENCOUNTER — Ambulatory Visit (INDEPENDENT_AMBULATORY_CARE_PROVIDER_SITE_OTHER): Payer: Medicare Other

## 2015-05-19 ENCOUNTER — Telehealth: Payer: Self-pay

## 2015-05-19 DIAGNOSIS — Z950 Presence of cardiac pacemaker: Secondary | ICD-10-CM

## 2015-05-19 DIAGNOSIS — I5022 Chronic systolic (congestive) heart failure: Secondary | ICD-10-CM | POA: Diagnosis not present

## 2015-05-19 NOTE — Telephone Encounter (Signed)
Remote ICM transmission received.  Attempted patient call and spoke with wife.  She stated he was napping and requested a call back.

## 2015-05-19 NOTE — Progress Notes (Signed)
EPIC Encounter for ICM Monitoring  Patient Name: Thomas Long is a 82 y.o. male Date: 05/19/2015 Primary Care Physican: Mathews Argyle, MD Primary Cardiologist: Claiborne Billings Electrophysiologist: Allred Dry Weight: 198 lbs   Bi-V Pacing 98%      In the past month, have you:  1. Gained more than 2 pounds in a day or more than 5 pounds in a week? No  2. Had changes in your medications (with verification of current medications)? No  3. Had more shortness of breath than is usual for you? No  4. Limited your activity because of shortness of breath? No  5. Not been able to sleep because of shortness of breath? No  6. Had increased swelling in your feet or ankles? No  7. Had symptoms of dehydration (dizziness, dry mouth, increased thirst, decreased urine output) No  8. Had changes in sodium restriction? No  9. Been compliant with medication? N/A   ICM trend: 3 month view for 05/19/2015   ICM trend: 1 year view for 05/19/2015   Follow-up plan: ICM clinic phone appointment on 07/03/2015.   Spoke with patient.   Thoracic impedance below reference line from 05/14/2015 to 05/17/2015 suggesting fluid accumulation.  He denied any fluid symptoms.  Encouraged him to limit salt intake to 2000 mg daily.   No changes today.    Rosalene Billings, RN, CCM 05/19/2015 3:15 PM

## 2015-07-03 ENCOUNTER — Ambulatory Visit (INDEPENDENT_AMBULATORY_CARE_PROVIDER_SITE_OTHER): Payer: Medicare Other | Admitting: *Deleted

## 2015-07-03 DIAGNOSIS — I442 Atrioventricular block, complete: Secondary | ICD-10-CM | POA: Diagnosis not present

## 2015-07-03 DIAGNOSIS — Z950 Presence of cardiac pacemaker: Secondary | ICD-10-CM

## 2015-07-03 DIAGNOSIS — I5022 Chronic systolic (congestive) heart failure: Secondary | ICD-10-CM | POA: Diagnosis not present

## 2015-07-03 NOTE — Progress Notes (Signed)
Remote pacemaker transmission.   

## 2015-07-04 NOTE — Progress Notes (Addendum)
EPIC Encounter for ICM Monitoring  Patient Name: Thomas Long is a 82 y.o. male Date: 07/04/2015 Primary Care Physican: Mathews Argyle, MD Primary Cardiologist: Claiborne Billings Electrophysiologist: Allred Dry Weight: 200 lbs   Bi-V Pacing 98%      In the past month, have you:  1. Gained more than 2 pounds in a day or more than 5 pounds in a week? no  2. Had changes in your medications (with verification of current medications)? no  3. Had more shortness of breath than is usual for you? no  4. Limited your activity because of shortness of breath? no  5. Not been able to sleep because of shortness of breath? no  6. Had increased swelling in your feet, ankles, legs or stomach area? no  7. Had symptoms of dehydration (dizziness, dry mouth, increased thirst, decreased urine output) no  8. Had changes in sodium restriction? no  9. Been compliant with medication? Yes  ICM trend: 3 month view for 07/03/2015   ICM trend: 1 year view for 07/03/2015  Follow-up plan: ICM clinic phone appointment 08/04/2015.    FLUID LEVELS:  Corvue thoracic impedance decreased 06/21/2015 to 06/28/2015 suggesting fluid accumulation and return to baseline 06/29/2015 suggesting stable fluid levels.    SYMPTOMS:   None.  He did not have any symptoms during the time of decreased thoracic impedance and thought fluid could be related to his birthday celebration foods.   He reported he is doing well at this time.  Denied any symptoms such as weight gain of 3 pounds overnight or 5 pounds within a week, SOB and/or lower extremity swelling.  Encouraged to call for any fluid symptoms.   EDUCATION:   Reminded to limit sodium intake to < 2000 mg and fluid intake to 64 oz daily.     RECOMMENDATIONS: No changes today.    Rosalene Billings, RN, CCM 07/04/2015 8:51 AM

## 2015-07-08 ENCOUNTER — Ambulatory Visit (INDEPENDENT_AMBULATORY_CARE_PROVIDER_SITE_OTHER): Payer: Medicare Other | Admitting: Neurology

## 2015-07-08 ENCOUNTER — Encounter: Payer: Self-pay | Admitting: Neurology

## 2015-07-08 VITALS — BP 120/64 | HR 60 | Resp 20 | Ht 70.0 in | Wt 202.0 lb

## 2015-07-08 DIAGNOSIS — R413 Other amnesia: Secondary | ICD-10-CM | POA: Diagnosis not present

## 2015-07-08 DIAGNOSIS — R569 Unspecified convulsions: Secondary | ICD-10-CM

## 2015-07-08 DIAGNOSIS — I724 Aneurysm of artery of lower extremity: Secondary | ICD-10-CM

## 2015-07-08 DIAGNOSIS — I509 Heart failure, unspecified: Secondary | ICD-10-CM | POA: Insufficient documentation

## 2015-07-08 DIAGNOSIS — S7410XA Injury of femoral nerve at hip and thigh level, unspecified leg, initial encounter: Secondary | ICD-10-CM | POA: Insufficient documentation

## 2015-07-08 DIAGNOSIS — S7411XS Injury of femoral nerve at hip and thigh level, right leg, sequela: Secondary | ICD-10-CM

## 2015-07-08 NOTE — Progress Notes (Signed)
PATIENT: Thomas Long DOB: [birth year]  REASON FOR VISIT: follow up- seizures, memory HISTORY FROM: patient  HISTORY OF PRESENT ILLNESS: Thomas Long is an 82 year old male with a history of seizures, stroke and memory loss. He returns today for follow-up visit.  The patient continues to take Keppra XL. He denies any seizure events since the last visit.  He states that he continues to tolerate the medication well. The patient denies any significant changes in his memory. He continues on Namenda. He does report since the last visit he had a pacemaker placed. He did have some complications after the procedure. 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 Since then he states that he is doing better. He is in physical therapy for his leg weakness. He does state that since undergoing anesthesia that may have affected his memory some.  He is able to complete all ADLs independently. His wife states that she supervises just because she does not want him to fall.  He does not operate a motor vehicle any longer . He denies any new neurological symptoms. A MMSE was performed today  ( 07-08-2015) and he scored 28-30 points.  Thomas Long had 4 several months last year been excessively daytime sleepy and also at nighttime confusional episodes, he sleeps more now after he had a pacemaker inserted on 11-05-2014 but he seems to have more restful sleep and less of the interruptions and confusional spells at night. There may be a correlation to the pacemaker. pulserate is calmer. 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 Thomas Long had one spell of severe shortness of breath while at church ascending the stairs to [facility]  Before the pacemaker was placed. . He had bradycardia of 30 bpm- late September 2016. Leg pain at the groin, fell twice after the pacemaker insertion. 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 In early October he had to undergo surgery. He has not completely recovered his use of the leg. He uses a cane, has trouble to drive. Had to go rehab.   HISTORY  Thomas Long is an 82 year old male with a history of seizures, stroke and memory loss. He returns today for follow-up. The patient is currently taking Keppra XL for seizures and tolerating it well. The patient denies any seizure events. The patient is also on Namenda for memory loss. He reports that his memory has remained stable. He continues to operate a motor vehicle without difficulty. Denies having to give up anything due to his memory. He is able to complete all ADLs independently. The patient did have a stroke in 2008. Since then he has not expressed any strokelike symptoms. He is on Aggrenox. His primary care provider manages his blood pressure and cholesterol. Patient denies any new medical issues. He returns today for an evaluation.   HISTORY 12/21/13: Thomas Long is a 82 year old male with a history ofSeizures and small stroke. He returns today for follow-up. He is currently taking Keppra XL and tolerating it well. He denies any recent seizure events. His seizure was during the night and it was a grand mal according to the wife. This is the only seizure he has had. They found that he had a small stroke at the same time. He operates a Teacher, music without difficulty. The patient was started on Namenda for memory loss. He reports that his memory has  improved with namenda. His wife has noticed improvement as well. Denies having to give up anything due to his memory. He continues to do the finances at home. He has a hard recalling things but eventually it will come back to him. In 2008 his MRI showed a small infarct posterior aspect of the left lenticular nucleus. He is on aggrenox. His PCP Dr. Felipa Eth manages his BP and cholesterol. Patient remains active. He continues to visit "shut-ins" at his church.                                            REVIEW OF SYSTEMS: Out of a complete 14 system review of symptoms, the patient complains only of the following symptoms, and all other reviewed systems are negative.  Bruise easily, memory loss, walking difficulty, frequent waking, activity change, gait impairment - right  leg weakness after femoral  nerve damage from pseudoaneurysm. He grieves his independence, can not drive. He has trouble walking in the home. Needs a lift chair.    ALLERGIES: No Known Allergies  HOME MEDICATIONS: Outpatient Prescriptions Prior to Visit  Medication Sig Dispense Refill  . atorvastatin (LIPITOR) 10 MG tablet Take 1 tablet (10 mg total) by mouth daily. 90 tablet 2  . ketoconazole (NIZORAL) 2 % shampoo Apply 1 application topically daily as needed for irritation. AS NEEDED  5  . latanoprost (XALATAN) 0.005 % ophthalmic solution Place 1 drop into both eyes at bedtime.   3  . levETIRAcetam (KEPPRA XR) 500 MG 24 hr tablet TAKE 1 TABLET BY MOUTH EVERY DAY 90 tablet 1  . loratadine (CLARITIN) 10 MG tablet Take 10 mg by mouth daily.    . memantine (NAMENDA) 10 MG tablet TAKE 1 TABLET TWICE A DAY 180 tablet 1  . Multiple Vitamins-Minerals (CENTRUM SILVER ADULT 50+ PO) Take 1 capsule by mouth daily.    . rivaroxaban (XARELTO) 20 MG TABS tablet Take 1 tablet (20 mg total) by mouth daily with supper. 90 tablet 3   No facility-administered medications prior to visit.    PAST MEDICAL HISTORY: Past Medical History  Diagnosis Date  . Depression   . Hyperlipidemia   . Hypertension   . Chronic pain   . TIA (transient ischemic attack)   . Presence of permanent cardiac pacemaker   . Facial cellulitis 02/19/2009    "related to [facility] & having skin cancer zapped; took him off the [place]"  . Sleep apnea     "gone since losing weight"  . Hypothyroidism   . Seizures ([place]) 2011    "vs stroke; never determined which it was; on sz RX since"  (11/08/2014)  . Stroke [facility]) 20111    "vs seizure; never determined which  it was; on sz RX since"  (11/08/2014)  . Rheumatoid arthritis ([place])     "hands mainly" (11/08/2014)  . Dementia     "memory lapses q now and then" (11/08/2014)  . Complete heart block ([place]) 11/07/2014    a. s/p [facility] Quadra Allure MP RF model [id number] (serial number [id number]) biventricular pacemaker 10/2014.  Marland Kitchen CAD (coronary artery disease)     a. LHC 10/2014: nonobstructive disease, 30% LAD with bridging up to 50%.  Marland Kitchen NICM (nonischemic cardiomyopathy) ([place])     a. 10/2014: EF 45%. (Prev 40-45% by echo 08/2014).  . Orthostatic hypotension     PAST SURGICAL HISTORY: Past  Surgical History  Procedure Laterality Date  . Bi-ventricular pacemaker insertion (crt-p)  11/08/2014  . Tonsillectomy    . Cholecystectomy open  1980's  . Exploratory laparotomy  1990's    "put intestines back in & added mesh"  . Appendectomy  ~ 1988  . Cataract extraction w/ intraocular lens  implant, bilateral Bilateral early 2000's  . Knee cartilage surgery Left 1990's    "opened it up"  . Cardiac catheterization  04/01/09    which showed low normal EF at 50% with question of underlying borderline area of minimal inferoapical hypercontractility. He had mild coronary obstructive disease with coronary calcification and segmental 20% narrowing in the LAD proximally, 20% in the mid LAD with muscle bridging. There was calcification at the ostium of the RCA, and 20 to 30%narrowing of the proximal to mid RCA with 30% distal n  . Cardiac catheterization N/A 11/07/2014    Procedure: Left Heart Cath and Coronary Angiography/ temp wire;  Surgeon: [name], MD;  Location: [place] [facility] LAB;  Service: Cardiovascular;  Laterality: N/A;  . Ep implantable device N/A 11/08/2014    Procedure: Pacemaker Implant;  Surgeon: [name], MD; [facility] MP RF model [id number] (serial number [id number]) pacemaker; Laterality: Left  . Ep implantable device N/A 11/10/2014    Procedure: Lead Revision/Repair;  Surgeon: [name], MD;   . False aneurysm repair Right 11/15/2014    Procedure: REPAIR OF RIGHT FEMORAL FALSE ANEURYSM ;  Surgeon: Rosetta Posner, MD;  Location: [facility] OR;  Service: Vascular;  Laterality: Right;    FAMILY HISTORY: Family History  Problem Relation Age of Onset  . Heart attack Father   . Cancer - Lung Sister   . Thyroid disease Child     SOCIAL HISTORY: Social History   Social History  . Marital Status: Married    Spouse Name: [name]  . Number of Children: 5  . Years of Education: College   Occupational History  . Not on file.   Social History Main Topics  . Smoking status: Never Smoker   . Smokeless tobacco: Never Used  . Alcohol Use: No  . Drug Use: No  . Sexual Activity: Not on file   Other Topics Concern  . Not on file   Social History Narrative   Patient is married [name]) and lives at home with wife and two children.   Patient has five adult children.   Patient is retired.   Patient has a college education.   Patient is right-handed.   Patient drinks two cups of coffee daily, 1/2 can of soda daily and tea- 2-3 times per week.      PHYSICAL EXAM  Filed Vitals:   07/08/15 1111  BP: 120/64  Pulse: 60  Resp: 20  Height: 5\' 10"  (1.778 m)  Weight: 202 lb (91.627 kg)   Body mass index is 28.98 kg/(m^2).  MMSE - Mini Mental State Exam 07/08/2015 01/06/2015  Orientation to time 4 3  Orientation to Place 5 5  Registration 3 3  Attention/ Calculation 5 5  Recall 2 2  Language- name 2 objects 2 2  Language- repeat 1 1  Language- follow 3 step command 3 3  Language- read & follow direction 1 1  Write a sentence 1 1  Copy design 1 0  Total score 28 26     Generalized: Well developed, in no acute distress   Neurological examination  Mentation: Alert. Follows all commands speech and language  fluent Cranial nerve II-XII: Pupils were equal round reactive to light. Extraocular movements were full, visual field were full on confrontational test. Facial  sensation and strength were normal. Uvula tongue midline. Head turning and shoulder shrug  were normal and symmetric. Motor: he has a right foot drop and hip flexor weakness. Sensory: numbness on the anterior thigh and lateral 5 this is a femoral nerve distribution as well   DIAGNOSTIC DATA (LABS, IMAGING, TESTING) - I reviewed patient records, labs, notes, testing and imaging myself where available. All from 12-2014 .    ASSESSMENT AND Long 82 y.o. year old male  has a past medical history of Depression; Hyperlipidemia; Hypertension; Chronic pain; TIA (transient ischemic attack); Presence of permanent cardiac pacemaker; Facial cellulitis (02/19/2009); Sleep apnea; Hypothyroidism; Seizures ([place]) (2011); Stroke [facility]) (20111); Rheumatoid arthritis ([place]); Dementia; Complete heart block (HCC) (11/07/2014); CAD (coronary artery disease); NICM (nonischemic cardiomyopathy) ([place]); and Orthostatic hypotension. here with;  The patient seems to have been much more stable and his vital signs, he also tends to have less confusion or nocturnal arousals since having a pacemaker implanted. 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 It limits his driving. The patient has a stable Mini-Mental Status Examination at 28 out of 30 points. I think once yearly follow-up for memory should be fine. He feels that both of his legs are numb to the right leg has worked drop and he has a complication of noted needing to walk with a cane to be stable there is also more weakness at the thigh and at the hamstrings. He is on chronic anticoagulation using Xarelto. I would like to order nerve conduction study with EMG, the EMG is at the discretion of my colleague given his anticoagulation status.  Overall the patient is doing well. He will continue on Keppra XL. He had one time a seizure and some of his other confusional  episodes may have reflected seizures. There has been no return of any seizure-like activity for over 12 months. 07-08-2015 .  Patient memory score is slightly decreased this visit. MMSE is 26/30 was previously 29/30. He will continue on Namenda.  Hypersomnia, schedules naps- likely cardiac related. No REM BD.   Patient advised that if his symptoms worsen or he develops any new symptoms he should let us know.  Q 6 months with Dr. Brett Fairy alternating with Ward Givens, NP /.  I will ask for an EMG and NCV study. Cardiac rehab. Per Dr Claiborne Billings.       Saher Davee, MD   07/08/2015, 11:23 AM [facility] [address] [place], [place] [address] [phone]

## 2015-07-13 ENCOUNTER — Other Ambulatory Visit: Payer: Self-pay | Admitting: Cardiovascular Disease

## 2015-07-14 NOTE — Telephone Encounter (Signed)
Rx(s) sent to pharmacy electronically.  

## 2015-07-16 ENCOUNTER — Encounter: Payer: Self-pay | Admitting: Cardiology

## 2015-07-17 ENCOUNTER — Encounter: Payer: Medicare Other | Admitting: Neurology

## 2015-07-21 LAB — CUP PACEART REMOTE DEVICE CHECK
Date Time Interrogation Session: 20170612153251
Implantable Lead Implant Date: 20160930
Implantable Lead Implant Date: 20160930
Implantable Lead Implant Date: 20160930
Implantable Lead Location: 753858
Implantable Lead Location: 753859
Implantable Lead Location: 753860
Lead Channel Setting Pacing Amplitude: 2 V
Lead Channel Setting Pacing Amplitude: 2 V
Lead Channel Setting Pacing Amplitude: 2 V
Lead Channel Setting Pacing Pulse Width: 0.5 ms
Lead Channel Setting Pacing Pulse Width: 0.8 ms
Lead Channel Setting Sensing Sensitivity: 5 mV
Pulse Gen Serial Number: 7798436

## 2015-08-04 ENCOUNTER — Ambulatory Visit (INDEPENDENT_AMBULATORY_CARE_PROVIDER_SITE_OTHER): Payer: Medicare Other

## 2015-08-04 ENCOUNTER — Telehealth: Payer: Self-pay | Admitting: Cardiology

## 2015-08-04 DIAGNOSIS — Z950 Presence of cardiac pacemaker: Secondary | ICD-10-CM

## 2015-08-04 DIAGNOSIS — I5022 Chronic systolic (congestive) heart failure: Secondary | ICD-10-CM

## 2015-08-04 NOTE — Telephone Encounter (Signed)
Spoke with pt and reminded pt of remote transmission that is due today. Pt verbalized understanding.   

## 2015-08-14 NOTE — Progress Notes (Signed)
EPIC Encounter for ICM Monitoring  Patient Name: Thomas Long is a 82 y.o. male Date: 08/14/2015 Primary Care Physican: Mathews Argyle, MD Primary Cardiologist: Claiborne Billings Electrophysiologist: Allred Dry Weight: unknown Bi-V Pacing:  98%        ICM trend: 08/04/2015     Direct trend view shows stable fluid levels through 08/12/2015.      Follow-up plan: ICM clinic phone appointment on 09/01/2015.  Copy of ICM check sent to device physician.   Rosalene Billings, RN 08/14/2015 8:57 AM

## 2015-08-16 ENCOUNTER — Other Ambulatory Visit: Payer: Self-pay | Admitting: Neurology

## 2015-09-01 ENCOUNTER — Ambulatory Visit (INDEPENDENT_AMBULATORY_CARE_PROVIDER_SITE_OTHER): Payer: Medicare Other

## 2015-09-01 ENCOUNTER — Telehealth: Payer: Self-pay | Admitting: Cardiology

## 2015-09-01 DIAGNOSIS — I5022 Chronic systolic (congestive) heart failure: Secondary | ICD-10-CM | POA: Diagnosis not present

## 2015-09-01 DIAGNOSIS — Z950 Presence of cardiac pacemaker: Secondary | ICD-10-CM

## 2015-09-01 NOTE — Telephone Encounter (Signed)
Spoke with pt and reminded pt of remote transmission that is due today. Pt verbalized understanding.   

## 2015-09-02 NOTE — Progress Notes (Signed)
EPIC Encounter for ICM Monitoring  Patient Name: Thomas Long is a 82 y.o. male Date: 09/02/2015 Primary Care Physican: Mathews Argyle, MD Primary Cardiologist: Claiborne Billings Electrophysiologist: Allred Dry Weight: 202 lb Bi-V Pacing:  97%       Heart Failure questions reviewed, pt asymptomatic   Thoracic impedance stable.  Recommendations: No changes.  Low sodium diet education provided   ICM trend: 09/01/2015    Follow-up plan: ICM clinic phone appointment on 10/06/2015.  Copy of ICM check sent to device physician.   Rosalene Billings, RN 09/02/2015 2:49 PM

## 2015-09-24 ENCOUNTER — Other Ambulatory Visit: Payer: Self-pay | Admitting: Neurology

## 2015-10-06 ENCOUNTER — Ambulatory Visit (INDEPENDENT_AMBULATORY_CARE_PROVIDER_SITE_OTHER): Payer: Medicare Other | Admitting: *Deleted

## 2015-10-06 ENCOUNTER — Telehealth: Payer: Self-pay

## 2015-10-06 DIAGNOSIS — Z950 Presence of cardiac pacemaker: Secondary | ICD-10-CM | POA: Diagnosis not present

## 2015-10-06 DIAGNOSIS — I5022 Chronic systolic (congestive) heart failure: Secondary | ICD-10-CM

## 2015-10-06 NOTE — Telephone Encounter (Signed)
Remote ICM transmission received.  Attempted patient call and no answer or answering machine.  

## 2015-10-06 NOTE — Progress Notes (Signed)
EPIC Encounter for ICM Monitoring  Patient Name: Thomas Long is a 82 y.o. male Date: 10/06/2015 Primary Care Physican: Mathews Argyle, MD Primary Cardiologist: Claiborne Billings Electrophysiologist: Allred Dry Weight:  unknown Bi-V Pacing:  97%       Attempted ICM call and unable to reach.  Transmission reviewed.   Thoracic impedance normal.  Follow-up plan: ICM clinic phone appointment on 11/06/2015.  Copy of ICM check sent to device physician.   ICM trend: 10/06/2015       Rosalene Billings, RN 10/06/2015 2:57 PM

## 2015-10-10 ENCOUNTER — Encounter: Payer: Self-pay | Admitting: Cardiology

## 2015-10-22 ENCOUNTER — Ambulatory Visit (INDEPENDENT_AMBULATORY_CARE_PROVIDER_SITE_OTHER): Payer: Medicare Other | Admitting: Cardiovascular Disease

## 2015-10-22 ENCOUNTER — Encounter: Payer: Self-pay | Admitting: Cardiovascular Disease

## 2015-10-22 VITALS — BP 90/60 | HR 100 | Ht 70.0 in | Wt 203.4 lb

## 2015-10-22 DIAGNOSIS — Z7901 Long term (current) use of anticoagulants: Secondary | ICD-10-CM

## 2015-10-22 DIAGNOSIS — I442 Atrioventricular block, complete: Secondary | ICD-10-CM

## 2015-10-22 DIAGNOSIS — I480 Paroxysmal atrial fibrillation: Secondary | ICD-10-CM | POA: Diagnosis not present

## 2015-10-22 DIAGNOSIS — Z950 Presence of cardiac pacemaker: Secondary | ICD-10-CM | POA: Diagnosis not present

## 2015-10-22 DIAGNOSIS — E785 Hyperlipidemia, unspecified: Secondary | ICD-10-CM

## 2015-10-22 NOTE — Patient Instructions (Signed)
Your physician wants you to follow-up in: 6 months. You will receive a reminder letter in the mail two months in advance. If you don't receive a letter, please call our office to schedule the follow-up appointment.    I will call [facility] office to schedule appointment with Dr.Allred

## 2015-10-24 NOTE — Progress Notes (Signed)
Patient ID: Thomas Long, male   DOB: 04-25-1931, 82 y.o.   MRN: [id number]    HPI: Kamarii Long is a 82 y.o. male who presents to the office today for an 8 month followup cardiology evaluation.  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  In 2011 cardiac catheterization revealed ejection fraction at 50% with mild coronary artery disease coronary calcification with segmental 20% narrowing in the proximal LAD and mid LAD with mild muscle bridging, calcification at the ostium of the right coronary artery with 20-30% narrowing in the proximal to mid RCA and 30% distal narrowing. He has a history of a TIA with possible seizure and has been on Aggrenox and Keppra and is currently followed by neurology. An echo Doppler study in April 2012 showed an ejection fraction of 50% with inferior hypokinesis, mild mitral annular calcification and mild TR, mild aortic sclerosis without stenosis. I had seen him on 05/30/2012 with complaints of increased fatigability as well as shortness of breath with less activity.  A two-year followup echo Doppler study on 07/06/2012 which showed mild LVH. Ejection fraction was 50-55% and again there was mild hypokinesis of the basal mid inferior myocardium and  grade 1 diastolic dysfunction. He had minimal pulmonary hypertension with PA pressure 32 mm, mitral annular calcification with trivial MR. His aortic valve was mildly sclerotic without stenosis.  When I saw on 08/01/2012 he had orthostatic symptoms and was hypotensive. At that time, I weaned slowly and ultimately discontinued his Bystolic. He felt improved off this therapy. He has been without exertional chest pain.  He does admit to some aching nonexertional chest pain, particularly when he lies down, which lasts seconds to minutes. He does note some shortness of breath particularly with activity.  He denies any recent seizure activity.  He has had some difficulty with early memory loss and has been  started on Namenda 10 mg twice a day by neurology.  He continues to take atorvastatin 10 mg for hyperlipidemia.  He is unaware of palpitations.   He underwent a follow-up echo Doppler study on 08/29/2014.  This showed normal LV cavity size, but his LV function has slightly declined and was now 40-45% without segmental wall motion abnormality.  There also was felt to be mild the reduced RV systolic function.  He has had issues with low blood pressure.  He  saw Thomas Doffing, PA in Dr. Stefani Dama office.  He has not had any seizure activity but continues to be on Keppra.  He does have some short-term memory issues, which seem to be exacerbated by being overtired.  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  He no longer does visitation at the hospital due to his inability to walk distances without developing  shortness of breath.  He denies any chest pain.  He denies any awareness of arrhythmia.  He denies any bleeding.    Since I last saw him in the office in early September, he was admitted to [facility] hospital on 11/07/2014 with symptomatic complete heart block.  Upon presenting to the emergency room, his heart rate was in the 30s.  He had experienced several episodes of recurrent chest pain.  He was brought semi-urgently to the cardiac catheterization laboratory in cardiac catheterization was performed by me without difficulty.  This revealed no significant obstructive CAD but there was mild mid systolic bridging in the mid LAD with narrowing up to 50% during systole.  He had a normal left circumflex coronary artery and there was calcification of the ostium of the RCA without significant stenosis and a very large dominant RCA vessel.  He had mild global LV dysfunction with an ejection fraction at 45%.  There was extensive mitral annular calcification.  He underwent insertion of a temporary transvenous pacemaker for his underlying complete heart block.  The following  day he underwent permanent pacemaker insertion by Dr. Rayann Heman and had a [employer] biventricular pacemaker implantation without difficulty.  Unfortunately, on October 2 1 back to the EP lab due to right atrial lead dislodgment and a new right atrial lead was placed and it was repositioning of a previously implanted right ventricular lead.  He developed a right groin pseudoaneurysm and underwent repair by Dr. Sherren Mocha Early on 11/15/2014.  He saw Dr. Rayann Heman back in the office on 01/07/2015.  Interrogation of his device revealed paroxysmal atrial fibrillation.  As result, his Aggrenox was discontinued and he was started on Xarelto 20 mg daily.  A follow-up echo Doppler study now showed normalization of LV function with an ejection fraction of 55-60%.  There was grade 2 diastolic dysfunction.  There was mild mitral regurgitation and mild dilatation of his left atrium.  Since I last saw him, he denies chest pain.  He denies any episodes of dizziness.  He had recently undergone a thoracic impedance evaluation, which was normal.  He was biventricular pacing 97% of the time.  He is followed by neurology.  He's not had any seizure activity.  He sleeps well.  He does note fatigue.  He presents for reevaluation.  Past Medical History:  Diagnosis Date  . CAD (coronary artery disease)    a. LHC 10/2014: nonobstructive disease, 30% LAD with bridging up to 50%.  . Chronic pain   . Complete heart block ([place]) 11/07/2014   a. s/p [facility] Quadra Allure MP RF model [id number] (serial number [id number]) biventricular pacemaker 10/2014.  Marland Kitchen Dementia    "memory lapses q now and then" (11/08/2014)  . Depression   . Facial cellulitis 02/19/2009   "related to [facility] & having skin cancer zapped; took him off the [place]"  . Hyperlipidemia   . Hypertension   . Hypothyroidism   . NICM (nonischemic cardiomyopathy) ([place])    a. 10/2014: EF 45%. (Prev 40-45% by echo 08/2014).  . Orthostatic hypotension   . Presence of permanent cardiac  pacemaker   . Rheumatoid arthritis ([place])    "hands mainly" (11/08/2014)  . Seizures ([place]) 2011   "vs stroke; never determined which it was; on sz RX since"  (11/08/2014)  . Sleep apnea    "gone since losing weight"  . Stroke [facility]) 20111   "vs seizure; never determined which it was; on sz RX since"  (11/08/2014)  . TIA (transient ischemic attack)     Past Surgical History:  Procedure Laterality Date  . APPENDECTOMY  ~ 1988  . BI-VENTRICULAR PACEMAKER INSERTION (CRT-P)  11/08/2014  . CARDIAC CATHETERIZATION  04/01/09   which showed low normal EF at 50% with question of underlying borderline area of minimal inferoapical hypercontractility. He had mild coronary obstructive disease with coronary calcification and segmental 20% narrowing in the LAD proximally, 20% in the mid LAD with muscle bridging. There was calcification at the ostium of the RCA, and 20 to 30%narrowing of the proximal to mid RCA with 30% distal n  . CARDIAC CATHETERIZATION N/A 11/07/2014   Procedure: Left Heart Cath and Coronary Angiography/ temp wire;  Surgeon: [name], MD;  Location: [place] [facility];  Service: Cardiovascular;  Laterality: N/A;  . CATARACT EXTRACTION W/ INTRAOCULAR LENS  IMPLANT, BILATERAL Bilateral early 2000's  . CHOLECYSTECTOMY OPEN  1980's  . EP IMPLANTABLE DEVICE N/A 11/08/2014   Procedure: Pacemaker Implant;  Surgeon: [name], MD; [facility] MP RF model [id number] (serial number [id number]) pacemaker; Laterality: Left  . EP IMPLANTABLE DEVICE N/A 11/10/2014   Procedure: Lead Revision/Repair;  Surgeon: [name], MD;   . EXPLORATORY LAPAROTOMY  1990's   "put intestines back in & added mesh"  . FALSE ANEURYSM REPAIR Right 11/15/2014   Procedure: REPAIR OF RIGHT FEMORAL FALSE ANEURYSM ;  Surgeon: Rosetta Posner, MD;  Location: [facility];  Service: Vascular;  Laterality: Right;  . KNEE CARTILAGE SURGERY Left 1990's   "opened it up"  . TONSILLECTOMY      No Known Allergies  Current  Outpatient Prescriptions  Medication Sig Dispense Refill  . atorvastatin (LIPITOR) 10 MG tablet TAKE 1 TABLET DAILY 90 tablet 2  . ketoconazole (NIZORAL) 2 % shampoo Apply 1 application topically daily as needed for irritation. AS NEEDED  5  . latanoprost (XALATAN) 0.005 % ophthalmic solution Place 1 drop into both eyes at bedtime.   3  . levETIRAcetam (KEPPRA XR) 500 MG 24 hr tablet TAKE 1 TABLET BY MOUTH EVERY DAY 90 tablet 3  . loratadine (CLARITIN) 10 MG tablet Take 10 mg by mouth daily.    . memantine (NAMENDA) 10 MG tablet TAKE 1 TABLET TWICE A DAY 180 tablet 1  . Multiple Vitamins-Minerals (CENTRUM SILVER ADULT 50+ PO) Take 1 capsule by mouth daily.    . rivaroxaban (XARELTO) 20 MG TABS tablet Take 1 tablet (20 mg total) by mouth daily with supper. 90 tablet 3  . SIMBRINZA 1-0.2 % SUSP PLACE 1 DROP INTO RIGHT EYE 3 TIMES A DAY  11   No current facility-administered medications for this visit.     Socially he is married has 5 children 6 grandchildren. He typically goes to bed approximately 8:30 at night and wakes up at approximately 4 AM in the morning. In the early morning he spends time in prayer and then typically attends 7 AM Mass. Since I last saw him he is retired from his [employer].  ROS General: Negative; No fevers, chills, or night sweats;  HEENT: Positive for visual changes in his right eye due to glaucoma; no change in hearing, sinus congestion, difficulty swallowing Pulmonary: Negative; No cough, wheezing, shortness of breath, hemoptysis Cardiovascular: See history of present illness GI: Negative; No nausea, vomiting, diarrhea, or abdominal pain GU: Negative; No dysuria, hematuria, or difficulty voiding Musculoskeletal: Negative; no myalgias, joint pain, or weakness Hematologic/Oncology: Negative; no easy bruising, bleeding Endocrine: Negative; no heat/cold intolerance; no diabetes Neuro: No recent seizures. Skin: Negative; No rashes or skin lesions Psychiatric:  Negative; No behavioral problems, depression Sleep: Negative; No snoring, daytime sleepiness, hypersomnolence, bruxism, restless legs, hypnogognic hallucinations, no cataplexy Other comprehensive 14 point system review is negative.   PE BP 90/60 (BP Location: Right Arm, Patient Position: Sitting, Cuff Size: Normal)   Pulse 100   Ht _0  (1.778 m)   Wt 203 lb 6 oz (92.3 kg)   BMI 29.18 kg/m     Repeat blood pressure by me was 102/64.  Wt Readings from Last 3 Encounters:  10/22/15 203 lb 6 oz (92.3 kg)  07/08/15 202 lb (91.6 kg)  02/18/15 198 lb 1.6 oz (89.9  kg)   General: Alert, oriented, no distress.  Skin: normal turgor, no rashes HEENT: Normocephalic, atraumatic. Pupils round and reactive; sclera anicteric;no lid lag.  Nose without nasal septal hypertrophy no xanthelasmas Mouth/Parynx benign; Mallinpatti scale 2 Neck: No JVD, no carotid bruits with normal carotid upstroke. Chest wall: Nontender to palpation Lungs: clear to ausculatation and percussion; no wheezing or rales Heart: RRR, s1 s2 normal  1/6 systolic murmur, unchanged. No diastolic murmur. No rubs thrills or heaves Abdomen: soft, nontender; no hepatosplenomehaly, BS+; abdominal aorta nontender and not dilated by palpation. mild central adiposity.  Back: No CVA tenderness Pulses 2+; well-healed incision in his right groin following pseudoaneurysm repair Extremities: no clubbing cyanosis or edema, Homan's sign negative  Neurologic: grossly nonfocal Psychological: Normal affect and mood  ECG (independently read by me): Atrial sensing and ventricular paced rhythm at 101 bpm.  January 2017 ECG (independently read by me): Paced rhythm at 93 bpm.  Possible underlying A. fib.  ECG (independently read by me): Sinus tachycardia at 10 7 bpm.  First degree AV block with a PR interval at 224 ms.  Mild RV conduction delay.  No ectopy  May 2016 ECG (independently read by me): Sinus rhythm at 98 bpm.  Occasional PVC.  No ST  segment changes.  QTc interval 472 ms.  First-degree AV block with a PR interval at 230 ms.  March 2015 ECG (independently read by me): Sinus tachycardia 103 beats per minute with one PAC. First degree AV block. RV conduction delay.  Prior 11/02/2012 ECG: Normal sinus rhythm at 94beats per minute with a rare PAC. Borderline first-degree block; PR 210 ms. nonspecific T changes.  LABS: BMP Latest Ref Rng & Units 12/25/2014 11/16/2014 11/15/2014  Glucose 65 - 99 mg/dL 103(H) 119(H) 142(H)  BUN 7 - 25 mg/dL _0 Creatinine 0.70 - 1.11 mg/dL 1.18(H) 0.82 0.90  Sodium 135 - 146 mmol/L 137 136 137  Potassium 3.5 - 5.3 mmol/L 4.7 4.5 4.2  Chloride 98 - 110 mmol/L 100 103 101  CO2 20 - 31 mmol/L _1 Calcium 8.6 - 10.3 mg/dL 9.7 8.1(L) 8.9   Hepatic Function Latest Ref Rng & Units 10/24/2014 04/25/2013  Total Protein 6.1 - 8.1 g/dL 6.1 6.8  Albumin 3.6 - 5.1 g/dL 3.7 4.1  AST 10 - 35 U/L 18 20  ALT 9 - 46 U/L 11 13  Alk Phosphatase 40 - 115 U/L 49 51  Total Bilirubin 0.2 - 1.2 mg/dL 0.8 0.9   CBC Latest Ref Rng & Units 12/25/2014 11/16/2014 11/15/2014  WBC 4.0 - 10.5 K/uL 7.5 11.4(H) 13.2(H)  Hemoglobin 13.0 - 17.0 g/dL 12.2(L) 9.9(L) 11.6(L)  Hematocrit 39.0 - 52.0 % 36.0(L) 30.0(L) 34.9(L)  Platelets 150 - 400 K/uL 347 291 320   Lab Results  Component Value Date   MCV 96.5 12/25/2014   MCV 98.4 11/16/2014   MCV 97.8 11/15/2014   Lab Results  Component Value Date   TSH 0.944 10/24/2014  No results found for: HGBA1C  Lipid Panel     Component Value Date/Time   CHOL 127 10/24/2014 0814   TRIG 113 10/24/2014 0814   HDL 42 10/24/2014 0814   CHOLHDL 3.0 10/24/2014 0814   VLDL 23 10/24/2014 0814   LDLCALC 62 10/24/2014 0814   RADIOLOGY: No results found.    ASSESSMENT AND PLAN: Thomas Long is an 82 year old white male has a history of documented mild CAD and previously was on beta blocker therapy.  Beta blocker had  been discontinued due to  previous orthostatic  hypotension and marked fatigability.  In 2016, he developed symptomatic complete heart block with heart rates in the 30s in the setting also of the mild recurrent chest pain.  Cardiac catheterization did not demonstrate significant obstructive disease and his LV function was reduced initially at 45%.  He ultimately underwent insertion of a biventricular pacemaker and several days later required lead revision due to atrial lead dislodgment.  His hospital course was complicated by the development of a pseudoaneurysm and he underwent successful repair by Dr. Donnetta Hutching.  He has been found to have paroxysmal atrial fibrillation on subsequent monitoring and for this reason is now on full dose anticoagulation with Xarelto.   His last echo Doppler study has shown normalization of LV function with grade 2 diastolic dysfunction.  He has seen the neurologist recently and he is stable from that perspective without recurrent seizure activity.  He continues to be on atorvastatin at 10 mg for mild hyperlipidemia.  He is on Namenda for memory issues.  Recent interrogation revealed normal thoracic impedance.  I will schedule him for a follow-up appointment to see Dr. Rayann Heman for one-year evaluation following his pacemaker insertion.  I will see him in 6 months for follow-up cartilaginous evaluation.   [name], MD, [facility]  10/24/2015 10:10 PM

## 2015-11-06 ENCOUNTER — Ambulatory Visit (INDEPENDENT_AMBULATORY_CARE_PROVIDER_SITE_OTHER): Payer: Medicare Other | Admitting: *Deleted

## 2015-11-06 ENCOUNTER — Telehealth: Payer: Self-pay

## 2015-11-06 DIAGNOSIS — I442 Atrioventricular block, complete: Secondary | ICD-10-CM

## 2015-11-06 DIAGNOSIS — Z950 Presence of cardiac pacemaker: Secondary | ICD-10-CM

## 2015-11-06 DIAGNOSIS — I5022 Chronic systolic (congestive) heart failure: Secondary | ICD-10-CM | POA: Diagnosis not present

## 2015-11-06 NOTE — Telephone Encounter (Signed)
Call to patient and requested to send remote transmission.  He has monitor located in den to send manually.  Explained he should move the monitor by his bed so the transmission can be sent automatically.  He stated he would do so.

## 2015-11-06 NOTE — Progress Notes (Signed)
EPIC Encounter for ICM Monitoring  Patient Name: Thomas Long is a 82 y.o. male Date: 11/06/2015 Primary Care Physican: Mathews Argyle, MD Primary Cardiologist: Claiborne Billings Electrophysiologist: Allred Dry Weight: 202 lb  Bi-V Pacing:  97%       Heart Failure questions reviewed, pt asymptomatic   Thoracic impedance normal   Recommendations: No changes.  Low sodium diet education provided.    Follow-up plan: ICM clinic phone appointment on 01/20/2016.  Office appointment on 12/18/2015 with Chanetta Marshall, NP  Copy of ICM check sent to device physician.   ICM trend: 11/06/2015       Rosalene Billings, RN 11/06/2015 10:17 AM

## 2015-11-06 NOTE — Progress Notes (Signed)
Remote pacemaker transmission.   

## 2015-11-12 ENCOUNTER — Encounter: Payer: Self-pay | Admitting: Cardiology

## 2015-11-26 ENCOUNTER — Encounter: Payer: Self-pay | Admitting: Cardiology

## 2015-12-04 ENCOUNTER — Encounter: Payer: Self-pay | Admitting: Nurse Practitioner

## 2015-12-04 LAB — CUP PACEART REMOTE DEVICE CHECK
Battery Remaining Longevity: 86 mo
Battery Remaining Percentage: 95.5 %
Battery Voltage: 2.98 V
Brady Statistic AP VP Percent: 1 %
Brady Statistic AP VS Percent: 1 %
Brady Statistic AS VP Percent: 97 %
Brady Statistic AS VS Percent: 2.5 %
Brady Statistic RA Percent Paced: 1 %
Date Time Interrogation Session: 20170928141515
Implantable Lead Implant Date: 20160930
Implantable Lead Implant Date: 20160930
Implantable Lead Implant Date: 20160930
Implantable Lead Location: 753858
Implantable Lead Location: 753859
Implantable Lead Location: 753860
Lead Channel Impedance Value: 360 Ohm
Lead Channel Impedance Value: 490 Ohm
Lead Channel Impedance Value: 590 Ohm
Lead Channel Pacing Threshold Amplitude: 0.5 V
Lead Channel Pacing Threshold Amplitude: 0.75 V
Lead Channel Pacing Threshold Amplitude: 0.875 V
Lead Channel Pacing Threshold Pulse Width: 0.5 ms
Lead Channel Pacing Threshold Pulse Width: 0.5 ms
Lead Channel Pacing Threshold Pulse Width: 0.8 ms
Lead Channel Sensing Intrinsic Amplitude: 4.1 mV
Lead Channel Sensing Intrinsic Amplitude: 7.2 mV
Lead Channel Setting Pacing Amplitude: 2 V
Lead Channel Setting Pacing Amplitude: 2 V
Lead Channel Setting Pacing Amplitude: 2 V
Lead Channel Setting Pacing Pulse Width: 0.5 ms
Lead Channel Setting Pacing Pulse Width: 0.8 ms
Lead Channel Setting Sensing Sensitivity: 5 mV
Pulse Gen Serial Number: 7798436

## 2015-12-09 ENCOUNTER — Other Ambulatory Visit: Payer: Self-pay | Admitting: Internal Medicine

## 2015-12-18 ENCOUNTER — Encounter: Payer: Self-pay | Admitting: Nurse Practitioner

## 2015-12-18 ENCOUNTER — Ambulatory Visit (INDEPENDENT_AMBULATORY_CARE_PROVIDER_SITE_OTHER): Payer: Medicare Other | Admitting: Nurse Practitioner

## 2015-12-18 VITALS — BP 122/72 | HR 100 | Ht 70.0 in | Wt 204.2 lb

## 2015-12-18 DIAGNOSIS — R000 Tachycardia, unspecified: Secondary | ICD-10-CM

## 2015-12-18 DIAGNOSIS — I5022 Chronic systolic (congestive) heart failure: Secondary | ICD-10-CM

## 2015-12-18 DIAGNOSIS — I442 Atrioventricular block, complete: Secondary | ICD-10-CM | POA: Diagnosis not present

## 2015-12-18 DIAGNOSIS — I951 Orthostatic hypotension: Secondary | ICD-10-CM

## 2015-12-18 NOTE — Patient Instructions (Signed)
Medication Instructions:   Your physician recommends that you continue on your current medications as directed. Please refer to the Current Medication list given to you today.   If you need a refill on your cardiac medications before your next appointment, please call your pharmacy.  Labwork: NONE ORDERED  TODAY    Testing/Procedures: NONE ORDERED  TODAY    Follow-Up:  Your physician wants you to follow-up in: [place] will receive a reminder letter in the mail two months in advance. If you don't receive a letter, please call our office to schedule the follow-up appointment.  Remote monitoring is used to monitor your Pacemaker of ICD from home. This monitoring reduces the number of office visits required to check your device to one time per year. It allows [place] to keep an eye on the functioning of your device to ensure it is working properly. You are scheduled for a device check from home on . 03/19/15..You may send your transmission at any time that day. If you have a wireless device, the transmission will be sent automatically. After your physician reviews your transmission, you will receive a postcard with your next transmission date.     Any Other Special Instructions Will Be Listed Below (If Applicable).

## 2015-12-18 NOTE — Progress Notes (Signed)
Electrophysiology Office Note Date: 12/19/2015  ID:  Thomas Long, DOB November 15, 1931, MRN [id number]  PCP: Mathews Argyle, MD Primary Cardiologist: Claiborne Billings Electrophysiologist: Allred  CC: Pacemaker follow-up  Thomas Long is a 82 y.o. male seen today for Dr Rayann Heman. He presents today for routine electrophysiology followup.  Since last being seen in our clinic, the patient reports doing relatively well. He has chronic fatigue and orthostatic intolerance. Medical therapy has been limited by hypotension. He denies chest pain, palpitations, dyspnea, PND, orthopnea, nausea, vomiting, syncope, edema, weight gain, or early satiety.   Past Medical History:  Diagnosis Date  . CAD (coronary artery disease)    a. LHC 10/2014: nonobstructive disease, 30% LAD with bridging up to 50%.  . Chronic pain   . Complete heart block ([place]) 11/07/2014   a. s/p [facility] Quadra Allure MP RF model [id number] (serial number [id number]) biventricular pacemaker 10/2014.  Marland Kitchen Dementia    "memory lapses q now and then" (11/08/2014)  . Depression   . Facial cellulitis 02/19/2009   "related to [facility] & having skin cancer zapped; took him off the Embrel"  . Hyperlipidemia   . Hypertension   . Hypothyroidism   . NICM (nonischemic cardiomyopathy) ([place])    a. 10/2014: EF 45%. (Prev 40-45% by echo 08/2014).  . Orthostatic hypotension   . Presence of permanent cardiac pacemaker   . Rheumatoid arthritis ([place])    "hands mainly" (11/08/2014)  . Seizures ([place]) 2011   "vs stroke; never determined which it was; on sz RX since"  (11/08/2014)  . Sleep apnea    "gone since losing weight"  . Stroke [facility]) 20111   "vs seizure; never determined which it was; on sz RX since"  (11/08/2014)  . TIA (transient ischemic attack)    Past Surgical History:  Procedure Laterality Date  . APPENDECTOMY  ~ 1988  . BI-VENTRICULAR PACEMAKER INSERTION (CRT-P)  11/08/2014  . CARDIAC CATHETERIZATION  04/01/09   which showed low normal EF at 50%  with question of underlying borderline area of minimal inferoapical hypercontractility. He had mild coronary obstructive disease with coronary calcification and segmental 20% narrowing in the LAD proximally, 20% in the mid LAD with muscle bridging. There was calcification at the ostium of the RCA, and 20 to 30%narrowing of the proximal to mid RCA with 30% distal n  . CARDIAC CATHETERIZATION N/A 11/07/2014   Procedure: Left Heart Cath and Coronary Angiography/ temp wire;  Surgeon: [name], MD;  Location: [place] [facility];  Service: Cardiovascular;  Laterality: N/A;  . CATARACT EXTRACTION W/ INTRAOCULAR LENS  IMPLANT, BILATERAL Bilateral early 2000's  . CHOLECYSTECTOMY OPEN  1980's  . EP IMPLANTABLE DEVICE N/A 11/08/2014   Procedure: Pacemaker Implant;  Surgeon: [name], MD; [facility] MP RF model [phone] [id number] (serial number [id number]) pacemaker; Laterality: Left  . EP IMPLANTABLE DEVICE N/A 11/10/2014   Procedure: Lead Revision/Repair;  Surgeon: [name], MD;   . EXPLORATORY LAPAROTOMY  1990's   "put intestines back in & added mesh"  . FALSE ANEURYSM REPAIR Right 11/15/2014   Procedure: REPAIR OF RIGHT FEMORAL FALSE ANEURYSM ;  Surgeon: Rosetta Posner, MD;  Location: ;  Service: Vascular;  Laterality: Right;  . KNEE CARTILAGE SURGERY Left 1990's   "opened it up"  . TONSILLECTOMY      Current Outpatient Prescriptions  Medication Sig Dispense Refill  . atorvastatin (LIPITOR) 10 MG tablet TAKE 1 TABLET DAILY 90 tablet 2  . ketoconazole (NIZORAL) 2 %  shampoo Apply 1 application topically daily as needed for irritation. AS NEEDED  5  . latanoprost (XALATAN) 0.005 % ophthalmic solution Place 1 drop into both eyes at bedtime.   3  . levETIRAcetam (KEPPRA XR) 500 MG 24 hr tablet TAKE 1 TABLET BY MOUTH EVERY DAY 90 tablet 3  . loratadine (CLARITIN) 10 MG tablet Take 10 mg by mouth daily.    . memantine (NAMENDA) 10 MG tablet TAKE 1 TABLET TWICE A DAY 180 tablet 1  .  Multiple Vitamins-Minerals (CENTRUM SILVER ADULT 50+ PO) Take 1 capsule by mouth daily.    . rivaroxaban (XARELTO) 20 MG TABS tablet Take 1 tablet (20 mg total) by mouth daily with supper. 90 tablet 0  . SIMBRINZA 1-0.2 % SUSP PLACE 1 DROP INTO RIGHT EYE 3 TIMES A DAY  11   No current facility-administered medications for this visit.     Allergies:   Patient has no known allergies.   Social History: Social History   Social History  . Marital status: Married    Spouse name: Thomas Long  . Number of children: 5  . Years of education: College   Occupational History  . Not on file.   Social History Main Topics  . Smoking status: Never Smoker  . Smokeless tobacco: Never Used  . Alcohol use No  . Drug use: No  . Sexual activity: Not on file   Other Topics Concern  . Not on file   Social History Narrative   Patient is married Thomas Long) and lives at home with wife and two children.   Patient has five adult children.   Patient is retired.   Patient has a college education.   Patient is right-handed.   Patient drinks two cups of coffee daily, 1/2 can of soda daily and tea- 2-3 times per week.    Family History: Family History  Problem Relation Age of Onset  . Heart attack Father   . Cancer - Lung Sister   . Thyroid disease Child      Review of Systems: All other systems reviewed and are otherwise negative except as noted above.   Physical Exam: VS:  BP 122/72   Pulse 100   Ht 5\' 10"  (1.778 m)   Wt 204 lb 4 oz (92.6 kg)   BMI 29.31 kg/m  , BMI Body mass index is 29.31 kg/m.  GEN- The patient is elderly and obese appearing, alert and oriented x 3 today.   HEENT: normocephalic, atraumatic; sclera clear, conjunctiva pink; hearing intact; oropharynx clear; neck supple Lungs- Clear to ausculation bilaterally, normal work of breathing.  No wheezes, rales, rhonchi Heart- Regular rate and rhythm (paced) GI- soft, non-tender, non-distended, bowel sounds present Extremities-  no clubbing, cyanosis, +BLE dependent edema MS- no significant deformity or atrophy Skin- warm and dry, no rash or lesion; PPM pocket well healed Psych- euthymic mood, full affect Neuro- strength and sensation are intact  PPM Interrogation- reviewed in detail today,  See PACEART report  Recent Labs: 12/25/2014: Brain Natriuretic Peptide 19.5; BUN 24; Creat 1.18; Hemoglobin 12.2; Platelets 347; Potassium 4.7; Sodium 137   Wt Readings from Last 3 Encounters:  12/18/15 204 lb 4 oz (92.6 kg)  10/22/15 203 lb 6 oz (92.3 kg)  07/08/15 202 lb (91.6 kg)     Other studies Reviewed: Additional studies/ records that were reviewed today include: Dr Rayann Heman and Dr Evette Georges office notes  Assessment and Plan:  1.  Complete heart block Normal PPM function See Claudia Desanctis Art  report No changes today  2.  Paroxysmal atrial fibrillation Continue Xarelto for CHADS2VASC of at least 5  3.  Sinus tachycardia Not able to tolerate BB 2/2 hypotension Could consider Corlanor.  Patient and wife would be willing to try if Dr Rayann Heman and Dr Claiborne Billings agree, will send note today  4.  Hypotension Chronic    Labs/ tests ordered today include: none No orders of the defined types were placed in this encounter.    Disposition:   Follow up with Dr Claiborne Billings as scheduled, Delilah Shan, [facility] clinic, Dr Rayann Heman 1 year    Signed, Chanetta Marshall, NP 12/19/2015 10:14 AM  [facility] [address] [place] [address] [phone] (office) [phone] (fax)

## 2015-12-29 LAB — CUP PACEART INCLINIC DEVICE CHECK
Date Time Interrogation Session: 20171120121222
Implantable Lead Implant Date: 20160930
Implantable Lead Implant Date: 20160930
Implantable Lead Implant Date: 20160930
Implantable Lead Location: 753858
Implantable Lead Location: 753859
Implantable Lead Location: 753860
Implantable Pulse Generator Implant Date: 20160930
Pulse Gen Serial Number: 7798436

## 2016-01-07 ENCOUNTER — Ambulatory Visit (INDEPENDENT_AMBULATORY_CARE_PROVIDER_SITE_OTHER): Payer: Medicare Other | Admitting: Adult Health

## 2016-01-07 ENCOUNTER — Encounter: Payer: Self-pay | Admitting: Adult Health

## 2016-01-07 VITALS — BP 101/68 | HR 97 | Wt 206.6 lb

## 2016-01-07 DIAGNOSIS — R569 Unspecified convulsions: Secondary | ICD-10-CM

## 2016-01-07 DIAGNOSIS — R413 Other amnesia: Secondary | ICD-10-CM

## 2016-01-07 NOTE — Progress Notes (Signed)
PATIENT: Thomas Long DOB: [birth year]  REASON FOR VISIT: follow up- seizures, stroke, memory loss HISTORY FROM: patient and wife  HISTORY OF PRESENT ILLNESS: Thomas Long is an 82 year old male with a history of seizures, stroke and memory loss. He returns today for follow-up. He is currently on Keppra XL 500 mg daily. He denies any seizure events. Overall he feels that he's been doing well. Feels that his memory has remained stable. His wife has noticed that when he is tired his memory is worse. States that he tries to take 1 nap a day. He does have a pacemaker but continues to have trouble with his blood pressure being low. He is able to complete all ADLs independently. His wife does prepare his medication box for him. Denies any trouble sleeping. Denies hallucinations. Denies tremor. At the last visit nerve conduction studies with EMG was ordered after the patient suffered a femoral nerve injury. However the patient deferred on these tests for now. He denies any new neurological symptoms. He returns today for an evaluation.   HISTORY 07/08/15 [facility]): Thomas Long is an 82 year old male with a history of seizures, stroke and memory loss. He returns today for follow-up visit.  The patient continues to take Keppra XL. He denies any seizure events since the last visit.  He states that he continues to tolerate the medication well. The patient denies any significant changes in his memory. He continues on Namenda. He does report since the last visit he had a pacemaker placed. He did have some complications after the procedure. 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 Since then he states that he is doing better. He is in physical therapy for his leg weakness. He does state that since undergoing anesthesia that may have affected his memory some.  He is able to complete all ADLs independently. His wife states that she supervises just because she does not  want him to fall.  He does not operate a motor vehicle any longer . He denies any new neurological symptoms. A MMSE was performed today  ( 07-08-2015) and he scored 28-30 points.  Thomas Long had 4 several months last year been excessively daytime sleepy and also at nighttime confusional episodes, he sleeps more now after he had a pacemaker inserted on 11-05-2014 but he seems to have more restful sleep and less of the interruptions and confusional spells at night. There may be a correlation to the pacemaker. pulserate is calmer. 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 Thomas Long had one spell of severe shortness of breath while at church ascending the stairs to [facility]  Before the pacemaker was placed. . He had bradycardia of 30 bpm- late September 2016. Leg pain at the groin, fell twice after the pacemaker insertion. 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 In early October he had to undergo surgery. He has not completely recovered his use of the leg. He uses a cane, has trouble to drive. Had to go rehab.   HISTORY  Thomas. Devincent is an 82 year old male with a history of seizures, stroke and memory loss. He returns today for follow-up. The patient is currently taking Keppra XL for seizures and tolerating it well. The patient denies any seizure events. The patient is also on Namenda for memory loss. He reports that his memory has remained stable. He continues to operate a motor  vehicle without difficulty. Denies having to give up anything due to his memory. He is able to complete all ADLs independently. The patient did have a stroke in 2008. Since then he has not expressed any strokelike symptoms. He is on Aggrenox. His primary care provider manages his blood pressure and cholesterol. Patient denies any new medical issues. He returns today for an evaluation.  REVIEW OF SYSTEMS: Out of a complete 14 system review of symptoms, the patient  complains only of the following symptoms, and all other reviewed systems are negative.  Bruise/bleed easily, environmental allergies, memory loss  ALLERGIES: No Known Allergies  HOME MEDICATIONS: Outpatient Medications Prior to Visit  Medication Sig Dispense Refill  . atorvastatin (LIPITOR) 10 MG tablet TAKE 1 TABLET DAILY 90 tablet 2  . ketoconazole (NIZORAL) 2 % shampoo Apply 1 application topically daily as needed for irritation. AS NEEDED  5  . latanoprost (XALATAN) 0.005 % ophthalmic solution Place 1 drop into both eyes at bedtime.   3  . levETIRAcetam (KEPPRA XR) 500 MG 24 hr tablet TAKE 1 TABLET BY MOUTH EVERY DAY 90 tablet 3  . loratadine (CLARITIN) 10 MG tablet Take 10 mg by mouth daily.    . memantine (NAMENDA) 10 MG tablet TAKE 1 TABLET TWICE A DAY 180 tablet 1  . Multiple Vitamins-Minerals (CENTRUM SILVER ADULT 50+ PO) Take 1 capsule by mouth daily.    . rivaroxaban (XARELTO) 20 MG TABS tablet Take 1 tablet (20 mg total) by mouth daily with supper. 90 tablet 0  . SIMBRINZA 1-0.2 % SUSP PLACE 1 DROP INTO RIGHT EYE 3 TIMES A DAY  11   No facility-administered medications prior to visit.     PAST MEDICAL HISTORY: Past Medical History:  Diagnosis Date  . CAD (coronary artery disease)    a. LHC 10/2014: nonobstructive disease, 30% LAD with bridging up to 50%.  . Chronic pain   . Complete heart block ([place]) 11/07/2014   a. s/p [facility] Quadra Allure MP RF model [id number] (serial number [id number]) biventricular pacemaker 10/2014.  Marland Kitchen Dementia    "memory lapses q now and then" (11/08/2014)  . Depression   . Facial cellulitis 02/19/2009   "related to [facility] & having skin cancer zapped; took him off the Embrel"  . Hyperlipidemia   . Hypertension   . Hypothyroidism   . NICM (nonischemic cardiomyopathy) ([place])    a. 10/2014: EF 45%. (Prev 40-45% by echo 08/2014).  . Orthostatic hypotension   . Presence of permanent cardiac pacemaker   . Rheumatoid arthritis ([place])    "hands mainly"  (11/08/2014)  . Seizures ([place]) 2011   "vs stroke; never determined which it was; on sz RX since"  (11/08/2014)  . Sleep apnea    "gone since losing weight"  . Stroke [facility]) 20111   "vs seizure; never determined which it was; on sz RX since"  (11/08/2014)  . TIA (transient ischemic attack)     PAST SURGICAL HISTORY: Past Surgical History:  Procedure Laterality Date  . APPENDECTOMY  ~ 1988  . BI-VENTRICULAR PACEMAKER INSERTION (CRT-P)  11/08/2014  . CARDIAC CATHETERIZATION  04/01/09   which showed low normal EF at 50% with question of underlying borderline area of minimal inferoapical hypercontractility. He had mild coronary obstructive disease with coronary calcification and segmental 20% narrowing in the LAD proximally, 20% in the mid LAD with muscle bridging. There was calcification at the ostium of the RCA, and 20 to 30%narrowing of the proximal to mid RCA with 30%  distal n  . CARDIAC CATHETERIZATION N/A 11/07/2014   Procedure: Left Heart Cath and Coronary Angiography/ temp wire;  Surgeon: [name], MD;  Location: [facility];  Service: Cardiovascular;  Laterality: N/A;  . CATARACT EXTRACTION W/ INTRAOCULAR LENS  IMPLANT, BILATERAL Bilateral early 2000's  . CHOLECYSTECTOMY OPEN  1980's  . EP IMPLANTABLE DEVICE N/A 11/08/2014   Procedure: Pacemaker Implant;  Surgeon: [name], MD; [facility] MP RF model [id number] (serial number [id number]) pacemaker; Laterality: Left  . EP IMPLANTABLE DEVICE N/A 11/10/2014   Procedure: Lead Revision/Repair;  Surgeon: [name], MD;   . EXPLORATORY LAPAROTOMY  1990's   "put intestines back in & added mesh"  . FALSE ANEURYSM REPAIR Right 11/15/2014   Procedure: REPAIR OF RIGHT FEMORAL FALSE ANEURYSM ;  Surgeon: Rosetta Posner, MD;  Location: [place];  Service: Vascular;  Laterality: Right;  . KNEE CARTILAGE SURGERY Left 1990's   "opened it up"  . TONSILLECTOMY      FAMILY HISTORY: Family History  Problem Relation Age of Onset  .  Heart attack Father   . Cancer - Lung Sister   . Thyroid disease Child     SOCIAL HISTORY: Social History   Social History  . Marital status: Married    Spouse name: [name]  . Number of children: 5  . Years of education: College   Occupational History  . Not on file.   Social History Main Topics  . Smoking status: Never Smoker  . Smokeless tobacco: Never Used  . Alcohol use No  . Drug use: No  . Sexual activity: Not on file   Other Topics Concern  . Not on file   Social History Narrative   Patient is married [name]) and lives at home with wife and two children.   Patient has five adult children.   Patient is retired.   Patient has a college education.   Patient is right-handed.   Patient drinks two cups of coffee daily, 1/2 can of soda daily and tea- 2-3 times per week.      PHYSICAL EXAM  Vitals:   01/07/16 1040  BP: 101/68  Pulse: 97  Weight: 206 lb 9.6 oz (93.7 kg)   Body mass index is 29.64 kg/m.   MMSE - Mini Mental State Exam 01/07/2016 07/08/2015 01/06/2015  Orientation to time 3 4 3   Orientation to Place 5 5 5   Registration 3 3 3   Attention/ Calculation 5 5 5   Recall 2 2 2   Language- name 2 objects 2 2 2   Language- repeat 1 1 1   Language- follow 3 step command 3 3 3   Language- read & follow direction 1 1 1   Write a sentence 1 1 1   Copy design 1 1 0  Total score 27 28 26      Generalized: Well developed, in no acute distress   Neurological examination  Mentation: Alert oriented to time, place, history taking. Follows all commands speech and language fluent Cranial nerve II-XII: Pupils were equal round reactive to light. Extraocular movements were full, visual field were full on confrontational test. Facial sensation and strength were normal. Uvula tongue midline. Head turning and shoulder shrug  were normal and symmetric. Motor: The motor testing reveals 5 over 5 strength of all 4 extremities. Good symmetric motor tone is noted throughout.    Sensory: Sensory testing is intact to soft touch on all 4 extremities. No evidence of extinction is noted.  Coordination: Cerebellar testing reveals  good finger-nose-finger and heel-to-shin bilaterally.  Gait and station: Patient has a slightly stooped posture. Gait is slightly unsteady. Tandem gait not attempted. Romberg is negative..  Reflexes: Deep tendon reflexes are symmetric and normal bilaterally.   DIAGNOSTIC DATA (LABS, IMAGING, TESTING) - I reviewed patient records, labs, notes, testing and imaging myself where available.  Lab Results  Component Value Date   WBC 7.5 12/25/2014   HGB 12.2 (L) 12/25/2014   HCT 36.0 (L) 12/25/2014   MCV 96.5 12/25/2014   PLT 347 12/25/2014      Component Value Date/Time   NA 137 12/25/2014 1605   K 4.7 12/25/2014 1605   CL 100 12/25/2014 1605   CO2 27 12/25/2014 1605   GLUCOSE 103 (H) 12/25/2014 1605   BUN 24 12/25/2014 1605   CREATININE 1.18 (H) 12/25/2014 1605   CALCIUM 9.7 12/25/2014 1605   PROT 6.1 10/24/2014 0814   ALBUMIN 3.7 10/24/2014 0814   AST 18 10/24/2014 0814   ALT 11 10/24/2014 0814   ALKPHOS 49 10/24/2014 0814   BILITOT 0.8 10/24/2014 0814   GFRNONAA >60 11/16/2014 0234   GFRAA >60 11/16/2014 0234   Lab Results  Component Value Date   CHOL 127 10/24/2014   HDL 42 10/24/2014   LDLCALC 62 10/24/2014   TRIG 113 10/24/2014   CHOLHDL 3.0 10/24/2014       ASSESSMENT AND Long 82 y.o. year old male  has a past medical history of CAD (coronary artery disease); Chronic pain; Complete heart block ([place]) (11/07/2014); Dementia; Depression; Facial cellulitis (02/19/2009); Hyperlipidemia; Hypertension; Hypothyroidism; NICM (nonischemic cardiomyopathy) ([place]); Orthostatic hypotension; Presence of permanent cardiac pacemaker; Rheumatoid arthritis ([place]); Seizures ([place]) (2011); Sleep apnea; Stroke [facility]) (20111); and TIA (transient ischemic attack). here with:  1. Seizures 2. Memory disturbance  Overall the patient has remained  stable. He will continue on Keppra XL 500 mg daily. His memory score is also stable. He will continue on Namenda. We will continue to monitor his memory. Patient advised that if his symptoms worsen or he develops any new symptoms he should let us know. He will follow-up in 6 months or sooner if needed.       Ward Givens, MSN, NP-C 01/07/2016, 10:50 AM [facility] [address] [place], [place] [address] [phone]

## 2016-01-07 NOTE — Patient Instructions (Signed)
Continue Keppra Memory score is stable If your symptoms worsen or you develop new symptoms please let us know.

## 2016-01-08 NOTE — Progress Notes (Signed)
I agree with the assessment and plan as directed by NP .The patient is known to me .   Bernita Beckstrom, MD  

## 2016-01-20 ENCOUNTER — Ambulatory Visit (INDEPENDENT_AMBULATORY_CARE_PROVIDER_SITE_OTHER): Payer: Medicare Other

## 2016-01-20 DIAGNOSIS — I5022 Chronic systolic (congestive) heart failure: Secondary | ICD-10-CM

## 2016-01-20 DIAGNOSIS — Z950 Presence of cardiac pacemaker: Secondary | ICD-10-CM | POA: Diagnosis not present

## 2016-01-20 NOTE — Progress Notes (Signed)
EPIC Encounter for ICM Monitoring  Patient Name: Thomas Long is a 82 y.o. male Date: 01/20/2016 Primary Care Physican: Mathews Argyle, MD Primary Cardiologist:Kelly Electrophysiologist: Allred Dry Weight:     unknown Bi-V Pacing:  97%              Heart Failure questions reviewed, pt asymptomatic.and feeling good.    Thoracic impedance normal   Recommendations: No changes.  Reinforced to limit low salt food choices to 2000 mg day and limiting fluid intake to < 2 liters per day. Encouraged to call for fluid symptoms.    Follow-up plan: ICM clinic phone appointment on 02/24/2016.  Copy of ICM check sent to device physician.   ICM trend: 01/20/2016             Rosalene Billings, RN 01/20/2016 5:02 PM

## 2016-01-31 IMAGING — CR DG CHEST 2V
2 series · 2 of 2 positions shown · non-contrast
Comparison: 11/11/2014

CLINICAL DATA: Shortness of breath for the past 2-3 weeks.

EXAM:
CHEST  2 VIEW

[w chest pa]
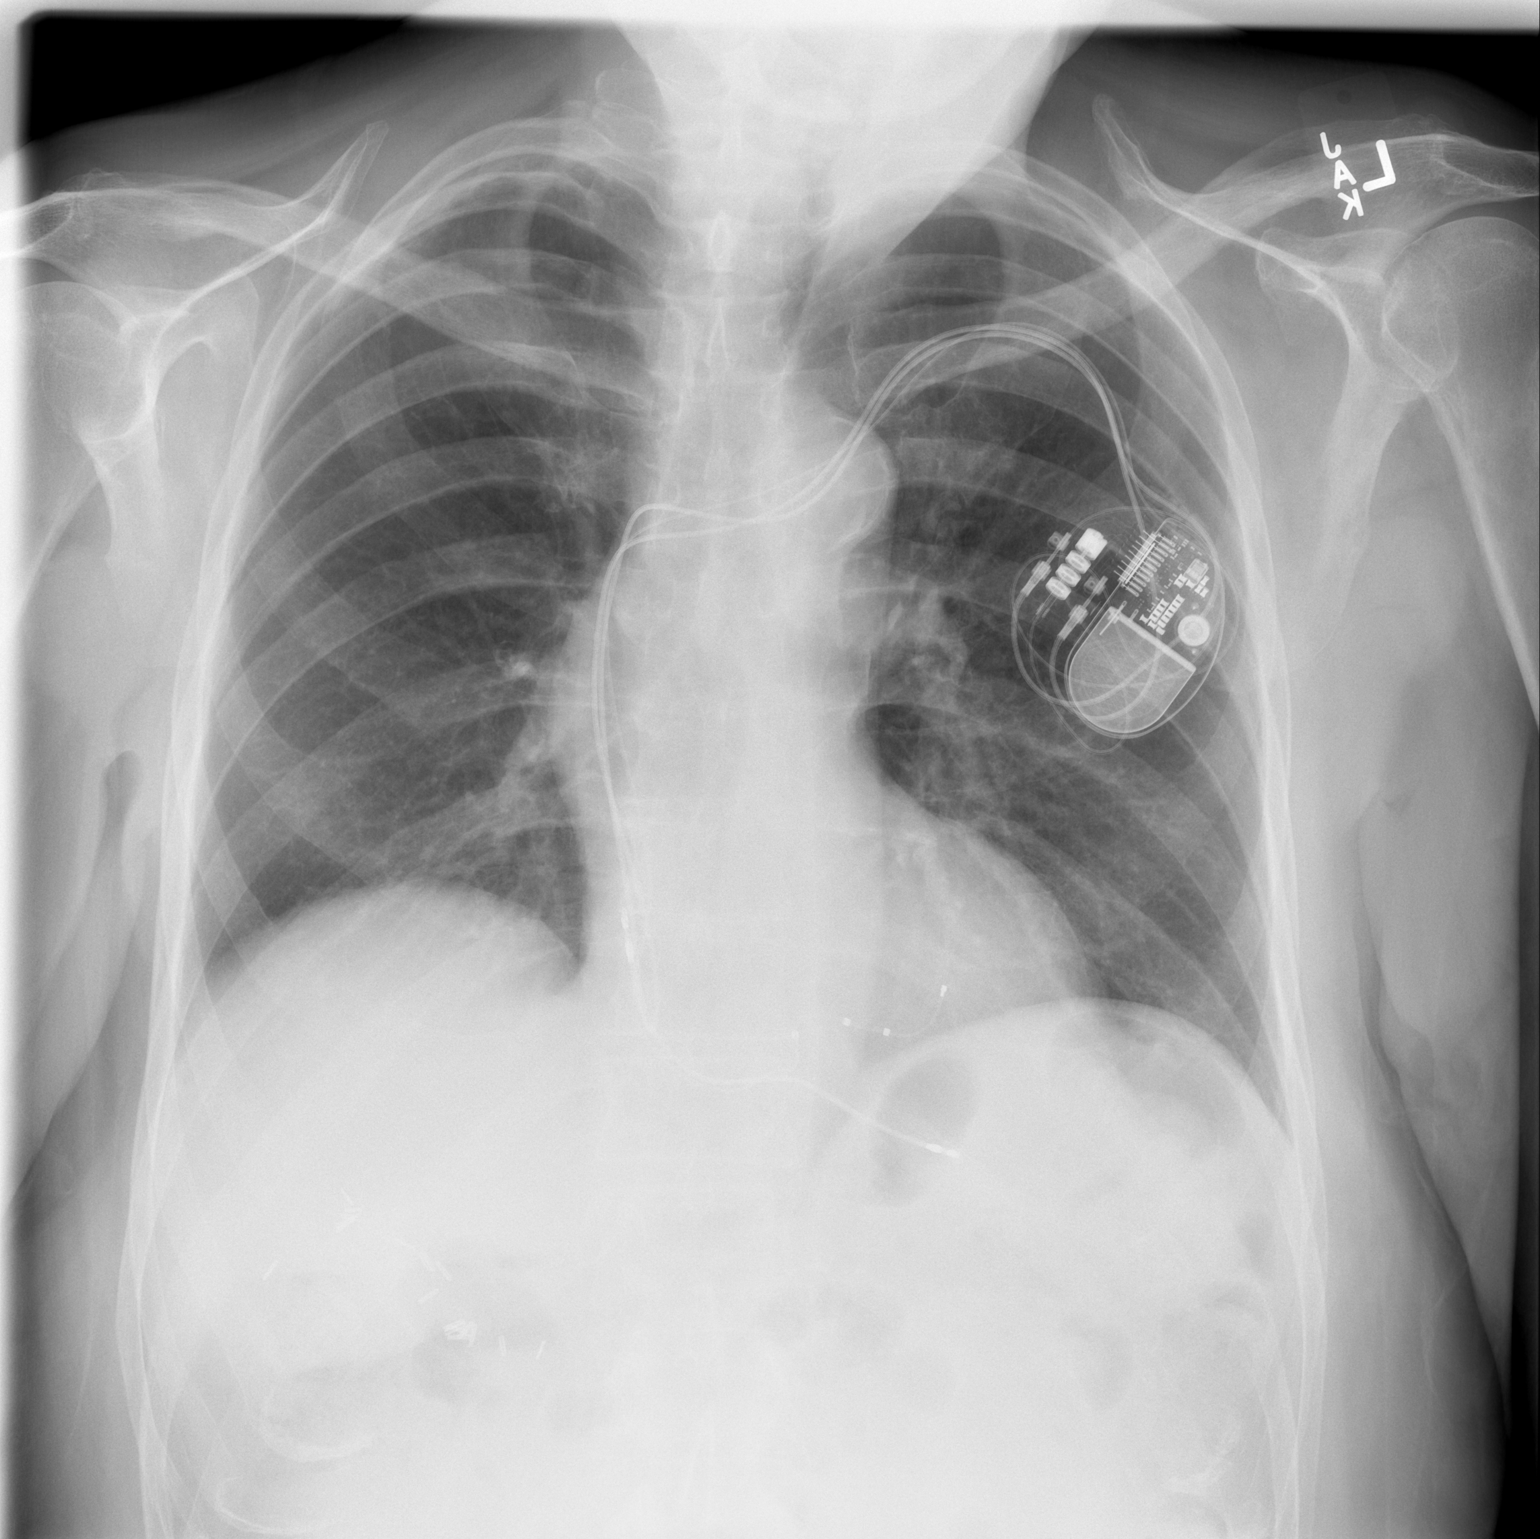

[w chest lat]
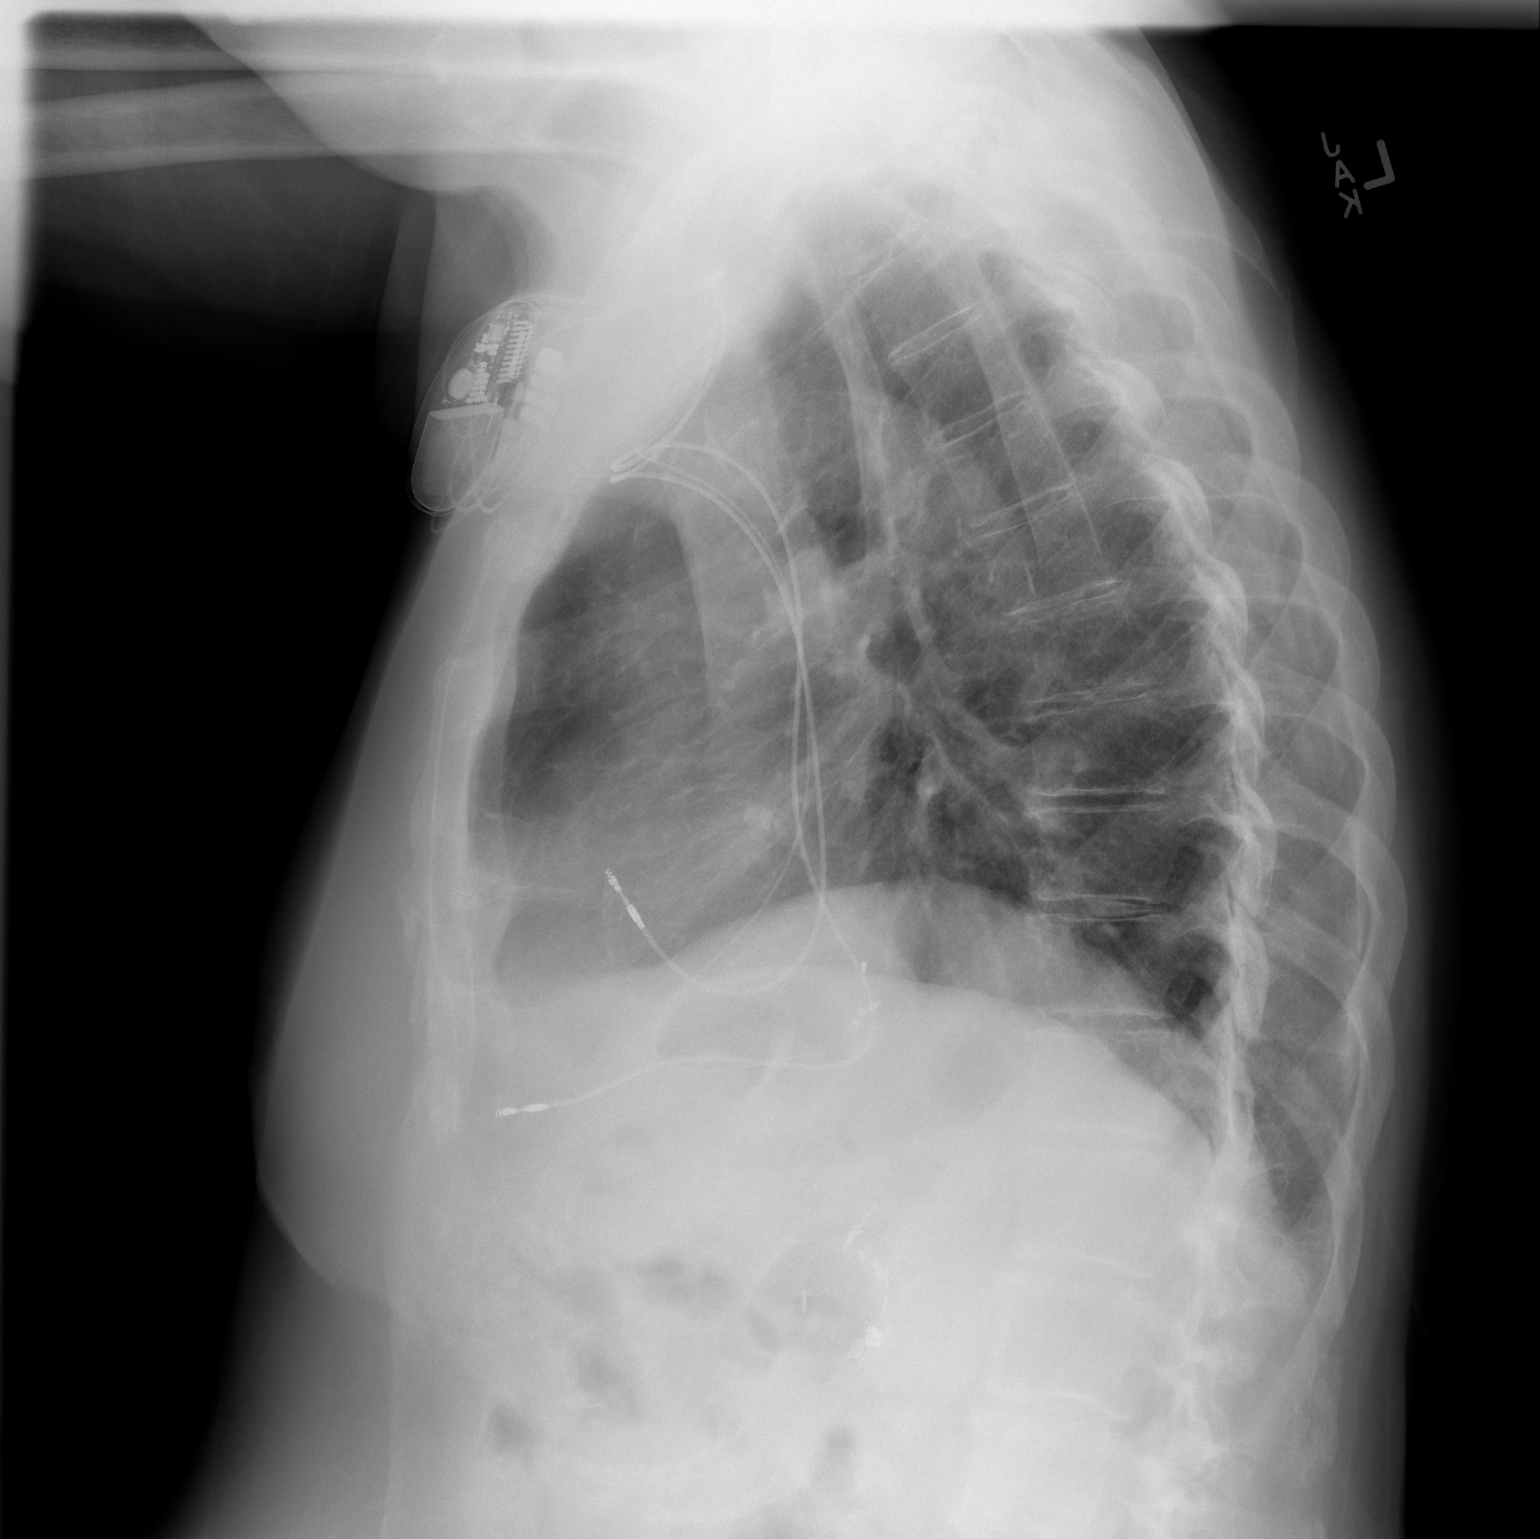

[2 of 2 positions shown; findings below may reference images not displayed]

FINDINGS: The cardiac silhouette, mediastinal and hilar contours are within
normal limits and stable. There is tortuosity and calcification of
the thoracic aorta. Stable pacer wires. Mild chronic bronchitic type
lung changes but no infiltrates, edema or effusions. The bony thorax
is intact.
IMPRESSION: Mild chronic lung changes but no acute pulmonary findings.

## 2016-02-08 ENCOUNTER — Other Ambulatory Visit: Payer: Self-pay | Admitting: Neurology

## 2016-02-24 ENCOUNTER — Telehealth: Payer: Self-pay | Admitting: Cardiology

## 2016-02-24 ENCOUNTER — Ambulatory Visit (INDEPENDENT_AMBULATORY_CARE_PROVIDER_SITE_OTHER): Payer: Medicare Other

## 2016-02-24 DIAGNOSIS — I5022 Chronic systolic (congestive) heart failure: Secondary | ICD-10-CM | POA: Diagnosis not present

## 2016-02-24 DIAGNOSIS — Z950 Presence of cardiac pacemaker: Secondary | ICD-10-CM

## 2016-02-24 NOTE — Telephone Encounter (Signed)
LMOVM for pt informing him that he doesn't have to do anything w/ his home monitor b/c it is automatic and we had already received it.

## 2016-02-24 NOTE — Telephone Encounter (Signed)
New Message:    Please call,pt says he does not know how to transmit.

## 2016-02-24 NOTE — Progress Notes (Signed)
EPIC Encounter for ICM Monitoring  Patient Name: Thomas Long is a 83 y.o. male Date: 02/24/2016 Primary Care Physican: Mathews Argyle, MD Primary Cardiologist:Kelly Electrophysiologist: Allred Dry Weight:unknown Bi-V Pacing: 97%      Spoke with wife.  Heart Failure questions reviewed, pt asymptomatic   Thoracic impedance normal   Recommendations: No changes. Discussed limiting dietary salt intake to 2000 mg/day and fluid intake to < 2 liters per day. Encouraged to call for fluid symptoms.  Follow-up plan: ICM clinic phone appointment on 03/26/2016.  Copy of ICM check sent to primary cardiologist and device physician.   3 month ICM trend: 02/24/2016   1 Year ICM trend:      Rosalene Billings, RN 02/24/2016 9:11 AM

## 2016-03-26 ENCOUNTER — Ambulatory Visit (INDEPENDENT_AMBULATORY_CARE_PROVIDER_SITE_OTHER): Payer: Medicare Other

## 2016-03-26 DIAGNOSIS — Z950 Presence of cardiac pacemaker: Secondary | ICD-10-CM | POA: Diagnosis not present

## 2016-03-26 DIAGNOSIS — I5022 Chronic systolic (congestive) heart failure: Secondary | ICD-10-CM

## 2016-03-26 NOTE — Progress Notes (Signed)
EPIC Encounter for ICM Monitoring  Patient Name: Thomas Long is a 83 y.o. male Date: 03/26/2016 Primary Care Physican: Mathews Argyle, MD Primary [place] Electrophysiologist: Allred Dry Weight:unknown Bi-V Pacing: 93%         Heart Failure questions reviewed, pt asymptomatic    Thoracic impedance normal.  Recommendations: No changes. Reminded to limit dietary salt intake to 2000 mg/day and fluid intake to < 2 liters/day. Encouraged to call for fluid symptoms.  Follow-up plan: ICM clinic phone appointment on 04/26/2016.  Copy of ICM check sent to device physician.   3 month ICM trend: 03/26/2016     1 Year ICM trend:      Rosalene Billings, RN 03/26/2016 11:08 AM

## 2016-04-05 ENCOUNTER — Telehealth: Payer: Self-pay | Admitting: Pharmacist

## 2016-04-05 ENCOUNTER — Other Ambulatory Visit: Payer: Self-pay | Admitting: Pharmacist

## 2016-04-05 MED ORDER — RIVAROXABAN 20 MG PO TABS: 20.0000 mg | ORAL_TABLET | Freq: Every day | ORAL | 1 refills | Status: DC

## 2016-04-05 NOTE — Telephone Encounter (Signed)
Pt's wife called asking for Xarelto refill. We do not have labs since 2016. States pt sees Dr Felipa Eth at [facility]. Called them and pt had labs checked in July 2017 - SCr 0.92. Pt on appropriate dose of Xarelto 20mg  daily and refill sent in.

## 2016-04-20 ENCOUNTER — Ambulatory Visit: Payer: Medicare Other | Admitting: Cardiovascular Disease

## 2016-04-26 ENCOUNTER — Ambulatory Visit: Payer: Medicare Other

## 2016-04-26 DIAGNOSIS — I5022 Chronic systolic (congestive) heart failure: Secondary | ICD-10-CM

## 2016-04-26 DIAGNOSIS — Z950 Presence of cardiac pacemaker: Secondary | ICD-10-CM

## 2016-04-26 NOTE — Progress Notes (Signed)
EPIC Encounter for ICM Monitoring  Patient Name: Thomas Long is a 83 y.o. male Date: 04/26/2016 Primary Care Physican: Mathews Argyle, MD Primary [place] Electrophysiologist: Allred Dry Weight:unknown Bi-V Pacing: 93%       Heart Failure questions reviewed, pt asymptomatic    Thoracic impedance normal.  No diuretic.   Recommendations: No changes. Reminded to limit dietary salt intake to 2000 mg/day and fluid intake to < 2 liters/day. Encouraged to call for fluid symptoms.  Follow-up plan: ICM clinic phone appointment on 4/20/218.  Office visit with Dr Claiborne Billings 04/29/2016  Copy of ICM check sent to primary cardiologist and device physician.   3 month ICM trend: 04/26/2016      1 Year ICM trend:      Rosalene Billings, RN 04/26/2016 11:35 AM

## 2016-04-29 ENCOUNTER — Ambulatory Visit (INDEPENDENT_AMBULATORY_CARE_PROVIDER_SITE_OTHER): Payer: Medicare Other | Admitting: Cardiovascular Disease

## 2016-04-29 VITALS — BP 118/74 | HR 94 | Ht 71.0 in | Wt 208.0 lb

## 2016-04-29 DIAGNOSIS — Z7901 Long term (current) use of anticoagulants: Secondary | ICD-10-CM | POA: Diagnosis not present

## 2016-04-29 DIAGNOSIS — I480 Paroxysmal atrial fibrillation: Secondary | ICD-10-CM

## 2016-04-29 DIAGNOSIS — I2510 Atherosclerotic heart disease of native coronary artery without angina pectoris: Secondary | ICD-10-CM | POA: Diagnosis not present

## 2016-04-29 DIAGNOSIS — I10 Essential (primary) hypertension: Secondary | ICD-10-CM | POA: Diagnosis not present

## 2016-04-29 DIAGNOSIS — R413 Other amnesia: Secondary | ICD-10-CM | POA: Diagnosis not present

## 2016-04-29 DIAGNOSIS — Z950 Presence of cardiac pacemaker: Secondary | ICD-10-CM

## 2016-04-29 MED ORDER — ATORVASTATIN CALCIUM 10 MG PO TABS: 10.0000 mg | ORAL_TABLET | Freq: Every day | ORAL | 3 refills | Status: DC

## 2016-04-29 NOTE — Patient Instructions (Signed)
Your physician wants you to follow-up in: 6 months or sooner if needed. You will receive a reminder letter in the mail two months in advance. If you don't receive a letter, please call our office to schedule the follow-up appointment.  No changes were made today in your therapy.  If you need a refill on your cardiac medications before your next appointment, please call your pharmacy.

## 2016-04-29 NOTE — Progress Notes (Signed)
Patient ID: Thomas Long, male   DOB: May 13, 1931, 83 y.o.   MRN: [id number]    HPI: Thomas Long is a 83 y.o. male who presents to the office today for an 6 month followup cardiology evaluation.  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  In 2011 cardiac catheterization revealed ejection fraction at 50% with mild coronary artery disease coronary calcification with segmental 20% narrowing in the proximal LAD and mid LAD with mild muscle bridging, calcification at the ostium of the right coronary artery with 20-30% narrowing in the proximal to mid RCA and 30% distal narrowing. He has a history of a TIA with possible seizure and has been on Aggrenox and Keppra and is currently followed by neurology. An echo Doppler study in April 2012 showed an ejection fraction of 50% with inferior hypokinesis, mild mitral annular calcification and mild TR, mild aortic sclerosis without stenosis. I had seen him on 05/30/2012 with complaints of increased fatigability as well as shortness of breath with less activity.  A two-year followup echo Doppler study on 07/06/2012 which showed mild LVH. Ejection fraction was 50-55% and again there was mild hypokinesis of the basal mid inferior myocardium and  grade 1 diastolic dysfunction. He had minimal pulmonary hypertension with PA pressure 32 mm, mitral annular calcification with trivial MR. His aortic valve was mildly sclerotic without stenosis.  When I saw on 08/01/2012 he had orthostatic symptoms and was hypotensive. At that time, I weaned slowly and ultimately discontinued his Bystolic. He felt improved off this therapy. He has been without exertional chest pain.  He does admit to some aching nonexertional chest pain, particularly when he lies down, which lasts seconds to minutes. He does note some shortness of breath particularly with activity.  He denies any recent seizure activity.  He has had some difficulty with early memory loss and has been  started on Namenda 10 mg twice a day by neurology.  He continues to take atorvastatin 10 mg for hyperlipidemia.  He is unaware of palpitations.   He underwent a follow-up echo Doppler study on 08/29/2014.  This showed normal LV cavity size, but his LV function has slightly declined and was now 40-45% without segmental wall motion abnormality.  There also was felt to be mild the reduced RV systolic function.  He has had issues with low blood pressure.  He  saw Karrie Doffing, PA in Dr. Stefani Dama office.  He has not had any seizure activity but continues to be on Keppra.  He does have some short-term memory issues, which seem to be exacerbated by being overtired.  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  He no longer does visitation at the hospital due to his inability to walk distances without developing  shortness of breath.  He denies any chest pain.  He denies any awareness of arrhythmia.  He denies any bleeding.    He was admitted to [facility] hospital on 11/07/2014 with symptomatic complete heart block.  Upon presenting to the emergency room, his heart rate was in the 30s.  He had experienced several episodes of recurrent chest pain.  He was brought semi-urgently to the cardiac catheterization laboratory in cardiac catheterization was performed by me without difficulty.  This revealed no significant obstructive CAD but there was mild mid systolic bridging in the mid LAD with narrowing up to 50% during systole.  He had a normal left circumflex coronary artery and there was  calcification of the ostium of the RCA without significant stenosis and a very large dominant RCA vessel.  He had mild global LV dysfunction with an ejection fraction at 45%.  There was extensive mitral annular calcification.  He underwent insertion of a temporary transvenous pacemaker for his underlying complete heart block.  The following day he underwent permanent pacemaker insertion by Dr.  Rayann Heman and had a [employer] biventricular pacemaker implantation without difficulty.  Unfortunately, on October 2 1 back to the EP lab due to right atrial lead dislodgment and a new right atrial lead was placed and it was repositioning of a previously implanted right ventricular lead.  He developed a right groin pseudoaneurysm and underwent repair by Dr. Sherren Mocha Early on 11/15/2014.  He saw Dr. Rayann Heman back in the office on 01/07/2015.  Interrogation of his device revealed paroxysmal atrial fibrillation.  As result, his Aggrenox was discontinued and he was started on Xarelto 20 mg daily.  A follow-up echo Doppler study now showed normalization of LV function with an ejection fraction of 55-60%.  There was grade 2 diastolic dysfunction.  There was mild mitral regurgitation and mild dilatation of his left atrium.  Since I last saw him, he denies chest pain.  He denies any episodes of dizziness.  He is unaware of any episodes of recurrent atrial fibrillation or tachycardic spells.  He has recently undergone a thoracic impedance evaluation on April 26 2016, which was normal.  He has had some issues with delayed memory.  His activity has been limited.  He admits to fatigue.  He is followed by neurology.  He's not had any seizure activity.  He is also followed by Dr. Felipa Eth.   He sleeps well.  He presents for reevaluation.  Past Medical History:  Diagnosis Date  . CAD (coronary artery disease)    a. LHC 10/2014: nonobstructive disease, 30% LAD with bridging up to 50%.  . Chronic pain   . Complete heart block ([place]) 11/07/2014   a. s/p [facility] Quadra Allure MP RF model [id number] (serial number [id number]) biventricular pacemaker 10/2014.  Marland Kitchen Dementia    "memory lapses q now and then" (11/08/2014)  . Depression   . Facial cellulitis 02/19/2009   "related to [facility] & having skin cancer zapped; took him off the [place]"  . Hyperlipidemia   . Hypertension   . Hypothyroidism   . NICM (nonischemic cardiomyopathy) ([place])     a. 10/2014: EF 45%. (Prev 40-45% by echo 08/2014).  . Orthostatic hypotension   . Presence of permanent cardiac pacemaker   . Rheumatoid arthritis ([place])    "hands mainly" (11/08/2014)  . Seizures ([place]) 2011   "vs stroke; never determined which it was; on sz RX since"  (11/08/2014)  . Sleep apnea    "gone since losing weight"  . Stroke [facility]) 20111   "vs seizure; never determined which it was; on sz RX since"  (11/08/2014)  . TIA (transient ischemic attack)     Past Surgical History:  Procedure Laterality Date  . APPENDECTOMY  ~ 1988  . BI-VENTRICULAR PACEMAKER INSERTION (CRT-P)  11/08/2014  . CARDIAC CATHETERIZATION  04/01/09   which showed low normal EF at 50% with question of underlying borderline area of minimal inferoapical hypercontractility. He had mild coronary obstructive disease with coronary calcification and segmental 20% narrowing in the LAD proximally, 20% in the mid LAD with muscle bridging. There was calcification at the ostium of the RCA, and 20 to 30%narrowing of the proximal to mid  RCA with 30% distal n  . CARDIAC CATHETERIZATION N/A 11/07/2014   Procedure: Left Heart Cath and Coronary Angiography/ temp wire;  Surgeon: [name], MD;  Location: [place] CV [facility];  Service: Cardiovascular;  Laterality: N/A;  . CATARACT EXTRACTION W/ INTRAOCULAR LENS  IMPLANT, BILATERAL Bilateral early 2000's  . CHOLECYSTECTOMY OPEN  1980's  . EP IMPLANTABLE DEVICE N/A 11/08/2014   Procedure: Pacemaker Implant;  Surgeon: [name], MD; [facility] MP RF model [id number] (serial number [id number]) pacemaker; Laterality: Left  . EP IMPLANTABLE DEVICE N/A 11/10/2014   Procedure: Lead Revision/Repair;  Surgeon: [name], MD;   . EXPLORATORY LAPAROTOMY  1990's   "put intestines back in & added mesh"  . FALSE ANEURYSM REPAIR Right 11/15/2014   Procedure: REPAIR OF RIGHT FEMORAL FALSE ANEURYSM ;  Surgeon: Rosetta Posner, MD;  Location: [place];  Service: Vascular;  Laterality:  Right;  . KNEE CARTILAGE SURGERY Left 1990's   "opened it up"  . TONSILLECTOMY      No Known Allergies  Current Outpatient Prescriptions  Medication Sig Dispense Refill  . ketoconazole (NIZORAL) 2 % shampoo Apply 1 application topically daily as needed for irritation. AS NEEDED  5  . latanoprost (XALATAN) 0.005 % ophthalmic solution Place 1 drop into both eyes at bedtime.   3  . levETIRAcetam (KEPPRA XR) 500 MG 24 hr tablet TAKE 1 TABLET BY MOUTH EVERY DAY 90 tablet 3  . loratadine (CLARITIN) 10 MG tablet Take 10 mg by mouth daily.    . memantine (NAMENDA) 10 MG tablet TAKE 1 TABLET TWICE A DAY 180 tablet 1  . Multiple Vitamins-Minerals (CENTRUM SILVER ADULT 50+ PO) Take 1 capsule by mouth daily.    . rivaroxaban (XARELTO) 20 MG TABS tablet Take 1 tablet (20 mg total) by mouth daily with supper. 90 tablet 1  . SIMBRINZA 1-0.2 % SUSP PLACE 1 DROP INTO RIGHT EYE 3 TIMES A DAY  11  . atorvastatin (LIPITOR) 10 MG tablet Take 1 tablet (10 mg total) by mouth daily. 90 tablet 3   No current facility-administered medications for this visit.     Socially he is married has 5 children 6 grandchildren. He typically goes to bed approximately 8:30 at night and wakes up at approximately 4 AM in the morning. In the early morning he spends time in prayer and then typically attends 7 AM Mass. Since I last saw him he is retired from his [employer].  ROS General: Negative; No fevers, chills, or night sweats;  HEENT: Positive for visual changes in his right eye due to glaucoma; no change in hearing, sinus congestion, difficulty swallowing Pulmonary: Negative; No cough, wheezing, shortness of breath, hemoptysis Cardiovascular: See history of present illness GI: Negative; No nausea, vomiting, diarrhea, or abdominal pain GU: Negative; No dysuria, hematuria, or difficulty voiding Musculoskeletal: Negative; no myalgias, joint pain, or weakness Hematologic/Oncology: Negative; no easy bruising,  bleeding Endocrine: Negative; no heat/cold intolerance; no diabetes Neuro: No recent seizures. Skin: Negative; No rashes or skin lesions Psychiatric: Negative; No behavioral problems, depression Sleep: Negative; No snoring, daytime sleepiness, hypersomnolence, bruxism, restless legs, hypnogognic hallucinations, no cataplexy Other comprehensive 14 point system review is negative.   PE BP 118/74   Pulse 94   Ht '5\' 11"'  (1.803 m)   Wt 208 lb (94.3 kg)   BMI 29.01 kg/m     Repeat blood pressure by me was 104/70  Wt Readings from Last 3 Encounters:  04/29/16 208 lb (94.3  kg)  01/07/16 206 lb 9.6 oz (93.7 kg)  12/18/15 204 lb 4 oz (92.6 kg)   General: Alert, oriented, no distress.  Skin: normal turgor, no rashes HEENT: Normocephalic, atraumatic. Pupils round and reactive; sclera anicteric;no lid lag.  Nose without nasal septal hypertrophy no xanthelasmas Mouth/Parynx benign; Mallinpatti scale 2 Neck: No JVD, no carotid bruits with normal carotid upstroke. Chest wall: Nontender to palpation Lungs: clear to ausculatation and percussion; no wheezing or rales Heart: RRR, s1 s2 normal  1/6 systolic murmur, unchanged. No diastolic murmur. No rubs thrills or heaves Abdomen: soft, nontender; no hepatosplenomehaly, BS+; abdominal aorta nontender and not dilated by palpation. mild central adiposity.  Back: No CVA tenderness Pulses 2+; well-healed incision in his right groin following pseudoaneurysm repair Extremities: no clubbing cyanosis or edema, Homan's sign negative  Neurologic: grossly nonfocal Psychological: Normal affect and mood  ECG (independently read by me): Atrially sensed, ventricular paced rhythm at 94 bpm.  PR interval 198 ms.  September 2017 ECG (independently read by me): Atrial sensing and ventricular paced rhythm at 101 bpm.  January 2017 ECG (independently read by me): Paced rhythm at 93 bpm.  Possible underlying A. fib.  ECG (independently read by me): Sinus  tachycardia at 10 7 bpm.  First degree AV block with a PR interval at 224 ms.  Mild RV conduction delay.  No ectopy  May 2016 ECG (independently read by me): Sinus rhythm at 98 bpm.  Occasional PVC.  No ST segment changes.  QTc interval 472 ms.  First-degree AV block with a PR interval at 230 ms.  March 2015 ECG (independently read by me): Sinus tachycardia 103 beats per minute with one PAC. First degree AV block. RV conduction delay.  Prior 11/02/2012 ECG: Normal sinus rhythm at 94beats per minute with a rare PAC. Borderline first-degree block; PR 210 ms. nonspecific T changes.  LABS: BMP Latest Ref Rng & Units 12/25/2014 11/16/2014 11/15/2014  Glucose 65 - 99 mg/dL 103(H) 119(H) 142(H)  BUN 7 - 25 mg/dL '24 16 16  ' Creatinine 0.70 - 1.11 mg/dL 1.18(H) 0.82 0.90  Sodium 135 - 146 mmol/L 137 136 137  Potassium 3.5 - 5.3 mmol/L 4.7 4.5 4.2  Chloride 98 - 110 mmol/L 100 103 101  CO2 20 - 31 mmol/L '27 27 27  ' Calcium 8.6 - 10.3 mg/dL 9.7 8.1(L) 8.9   Hepatic Function Latest Ref Rng & Units 10/24/2014 04/25/2013  Total Protein 6.1 - 8.1 g/dL 6.1 6.8  Albumin 3.6 - 5.1 g/dL 3.7 4.1  AST 10 - 35 U/L 18 20  ALT 9 - 46 U/L 11 13  Alk Phosphatase 40 - 115 U/L 49 51  Total Bilirubin 0.2 - 1.2 mg/dL 0.8 0.9   CBC Latest Ref Rng & Units 12/25/2014 11/16/2014 11/15/2014  WBC 4.0 - 10.5 K/uL 7.5 11.4(H) 13.2(H)  Hemoglobin 13.0 - 17.0 g/dL 12.2(L) 9.9(L) 11.6(L)  Hematocrit 39.0 - 52.0 % 36.0(L) 30.0(L) 34.9(L)  Platelets 150 - 400 K/uL 347 291 320   Lab Results  Component Value Date   MCV 96.5 12/25/2014   MCV 98.4 11/16/2014   MCV 97.8 11/15/2014   Lab Results  Component Value Date   TSH 0.944 10/24/2014  No results found for: HGBA1C  Lipid Panel     Component Value Date/Time   CHOL 127 10/24/2014 0814   TRIG 113 10/24/2014 0814   HDL 42 10/24/2014 0814   CHOLHDL 3.0 10/24/2014 0814   VLDL 23 10/24/2014 0814   LDLCALC 62 10/24/2014 0814  RADIOLOGY: No results  found.  IMPRESSION:  1. CAD in native artery   2. Essential hypertension   3. PAF (paroxysmal atrial fibrillation) ([place])   4. Anticoagulation adequate   5. Cardiac pacemaker   6. Memory difficulties     ASSESSMENT AND PLAN: Thomas Long is an 83 year old white male has a history of documented mild CAD and previously was on beta blocker therapy.  Beta blocker had been discontinued due to  previous orthostatic hypotension and marked fatigability.  In 2016, he developed symptomatic complete heart block with heart rates in the 30s in the setting also of the mild recurrent chest pain.  Cardiac catheterization did not demonstrate significant obstructive disease and his LV function was reduced initially at 45%.  He underwent insertion of a biventricular pacemaker and several days later required lead revision due to atrial lead dislodgment.  His hospital course was complicated by the development of a pseudoaneurysm and he underwent successful repair by Dr. Donnetta Hutching.  He has been found to have paroxysmal atrial fibrillation on subsequent monitoring and continues to be on full dose anticoagulation with Xarelto.   His last echo Doppler study has shown normalization of LV function with grade 2 diastolic dysfunction.  He is followed by  Neurology  and he is stable from that perspective without recurrent seizure activity.  He continues to be on atorvastatin at 10 mg for mild hyperlipidemia.  He is on Namenda 10 mg for memory issues.  Recent interrogation revealed normal thoracic impedance.  He has had laboratory at [facility] by Dr. Felipa Eth.  I will try to get these results.  I will see him in 6 months for follow-up cardiologic evaluation.   [name], MD, [facility]  05/01/2016 1:48 PM

## 2016-05-01 ENCOUNTER — Encounter: Payer: Self-pay | Admitting: Cardiovascular Disease

## 2016-05-24 ENCOUNTER — Ambulatory Visit (INDEPENDENT_AMBULATORY_CARE_PROVIDER_SITE_OTHER): Payer: Medicare Other | Admitting: *Deleted

## 2016-05-24 DIAGNOSIS — I442 Atrioventricular block, complete: Secondary | ICD-10-CM

## 2016-05-24 DIAGNOSIS — I5022 Chronic systolic (congestive) heart failure: Secondary | ICD-10-CM

## 2016-05-28 ENCOUNTER — Ambulatory Visit (INDEPENDENT_AMBULATORY_CARE_PROVIDER_SITE_OTHER): Payer: Medicare Other

## 2016-05-28 DIAGNOSIS — Z950 Presence of cardiac pacemaker: Secondary | ICD-10-CM

## 2016-05-28 DIAGNOSIS — I5022 Chronic systolic (congestive) heart failure: Secondary | ICD-10-CM

## 2016-05-28 NOTE — Progress Notes (Signed)
EPIC Encounter for ICM Monitoring  Patient Name: Thomas Long is a 83 y.o. male Date: 05/28/2016 Primary Care Physican: Mathews Argyle, MD Primary Cardiologist:[place] Electrophysiologist: Allred Dry Weight:unknown Bi-V Pacing: 93%             Heart Failure questions reviewed, pt asymptomatic    Thoracic impedance normal   No diuretic.   Recommendations: No changes. Discussed to limit salt intake to 2000 mg/day and fluid intake to < 2 liters/day.  Encouraged to call for fluid symptoms.  Follow-up plan: ICM clinic phone appointment on 06/28/2016.    Copy of ICM check sent to device physician.   3 month ICM trend: 05/26/2016   AT/AF    1 Year ICM trend:      Rosalene Billings, RN 05/28/2016 9:02 AM

## 2016-06-03 ENCOUNTER — Encounter: Payer: Self-pay | Admitting: Cardiology

## 2016-06-03 NOTE — Progress Notes (Signed)
Remote ICD transmission.   

## 2016-06-07 ENCOUNTER — Ambulatory Visit (INDEPENDENT_AMBULATORY_CARE_PROVIDER_SITE_OTHER): Payer: Medicare Other | Admitting: *Deleted

## 2016-06-07 DIAGNOSIS — I442 Atrioventricular block, complete: Secondary | ICD-10-CM | POA: Diagnosis not present

## 2016-06-07 NOTE — Progress Notes (Signed)
Remote pacemaker transmission.   

## 2016-06-08 ENCOUNTER — Encounter: Payer: Self-pay | Admitting: Cardiology

## 2016-06-08 LAB — CUP PACEART REMOTE DEVICE CHECK
Battery Remaining Longevity: 88 mo
Battery Remaining Longevity: 88 mo
Battery Remaining Percentage: 95.5 %
Battery Remaining Percentage: 95.5 %
Battery Voltage: 2.98 V
Battery Voltage: 2.98 V
Brady Statistic AP VP Percent: 1 %
Brady Statistic AP VP Percent: 1 %
Brady Statistic AP VS Percent: 1 %
Brady Statistic AP VS Percent: 1 %
Brady Statistic AS VP Percent: 93 %
Brady Statistic AS VP Percent: 93 %
Brady Statistic AS VS Percent: 6 %
Brady Statistic AS VS Percent: 6.1 %
Brady Statistic RA Percent Paced: 1 %
Brady Statistic RA Percent Paced: 1 %
Date Time Interrogation Session: 20180415000335
Date Time Interrogation Session: 20180429221020
Implantable Lead Implant Date: 20160930
Implantable Lead Implant Date: 20160930
Implantable Lead Implant Date: 20160930
Implantable Lead Implant Date: 20160930
Implantable Lead Implant Date: 20160930
Implantable Lead Implant Date: 20160930
Implantable Lead Location: 753858
Implantable Lead Location: 753859
Implantable Lead Location: 753860
Implantable Lead Location: 753858
Implantable Lead Location: 753859
Implantable Lead Location: 753860
Implantable Pulse Generator Implant Date: 20160930
Implantable Pulse Generator Implant Date: 20160930
Lead Channel Impedance Value: 360 Ohm
Lead Channel Impedance Value: 550 Ohm
Lead Channel Impedance Value: 590 Ohm
Lead Channel Impedance Value: 360 Ohm
Lead Channel Impedance Value: 490 Ohm
Lead Channel Impedance Value: 630 Ohm
Lead Channel Pacing Threshold Amplitude: 0.625 V
Lead Channel Pacing Threshold Amplitude: 0.75 V
Lead Channel Pacing Threshold Amplitude: 1 V
Lead Channel Pacing Threshold Amplitude: 0.625 V
Lead Channel Pacing Threshold Amplitude: 0.625 V
Lead Channel Pacing Threshold Amplitude: 1 V
Lead Channel Pacing Threshold Pulse Width: 0.5 ms
Lead Channel Pacing Threshold Pulse Width: 0.5 ms
Lead Channel Pacing Threshold Pulse Width: 0.8 ms
Lead Channel Pacing Threshold Pulse Width: 0.5 ms
Lead Channel Pacing Threshold Pulse Width: 0.5 ms
Lead Channel Pacing Threshold Pulse Width: 0.8 ms
Lead Channel Sensing Intrinsic Amplitude: 4.4 mV
Lead Channel Sensing Intrinsic Amplitude: 5.7 mV
Lead Channel Sensing Intrinsic Amplitude: 3.2 mV
Lead Channel Sensing Intrinsic Amplitude: 9 mV
Lead Channel Setting Pacing Amplitude: 2 V
Lead Channel Setting Pacing Amplitude: 2 V
Lead Channel Setting Pacing Amplitude: 2 V
Lead Channel Setting Pacing Amplitude: 2 V
Lead Channel Setting Pacing Amplitude: 2 V
Lead Channel Setting Pacing Amplitude: 2 V
Lead Channel Setting Pacing Pulse Width: 0.5 ms
Lead Channel Setting Pacing Pulse Width: 0.8 ms
Lead Channel Setting Pacing Pulse Width: 0.5 ms
Lead Channel Setting Pacing Pulse Width: 0.8 ms
Lead Channel Setting Sensing Sensitivity: 5 mV
Lead Channel Setting Sensing Sensitivity: 5 mV
Pulse Gen Model: 3262
Pulse Gen Model: 3262
Pulse Gen Serial Number: 7798436
Pulse Gen Serial Number: 7798436

## 2016-06-22 ENCOUNTER — Encounter: Payer: Self-pay | Admitting: Cardiology

## 2016-06-28 ENCOUNTER — Telehealth: Payer: Self-pay | Admitting: Cardiology

## 2016-06-28 ENCOUNTER — Ambulatory Visit (INDEPENDENT_AMBULATORY_CARE_PROVIDER_SITE_OTHER): Payer: Medicare Other

## 2016-06-28 DIAGNOSIS — I5022 Chronic systolic (congestive) heart failure: Secondary | ICD-10-CM | POA: Diagnosis not present

## 2016-06-28 DIAGNOSIS — Z950 Presence of cardiac pacemaker: Secondary | ICD-10-CM | POA: Diagnosis not present

## 2016-06-28 NOTE — Telephone Encounter (Signed)
Confirmed remote transmission w/ pt wife.   

## 2016-06-29 NOTE — Progress Notes (Signed)
EPIC Encounter for ICM Monitoring  Patient Name: Chinonso Linker is a 83 y.o. male Date: 06/29/2016 Primary Care Physican: Lajean Manes, MD Primary Cardiologist:[place] Electrophysiologist: Allred Dry Weight:unknown Bi-V Pacing: 92%    **Episodes since May 29, 2016: Mode Switch: 5 High V. Rate: 4 PMT: 1  **Alerts since May 29, 2016: **Alert Triggers: High V. Rate: 20 @ 150bpm AT/AF Duration: 24hrs AT/AF Burden: 48hrs weekly V Rate during AT/AF: 140bpm for 6hrs daily      Heart Failure questions reviewed, pt asymptomatic.   Thoracic impedance is normal but was abnormal suggesting fluid accumulation from 06/20/2016 to 06/27/2016.  No diuretic.   Recommendations: No changes. Discussed to limit salt intake to 2000 mg/day and fluid intake to < 2 liters/day.  Encouraged to call for fluid symptoms or use local ER for any urgent symptoms.  Follow-up plan: ICM clinic phone appointment on 07/30/2016.  Copy of ICM check sent to device physician.   3 month ICM trend: 06/29/2016       1 Year ICM trend:      Rosalene Billings, RN 06/29/2016 7:47 AM

## 2016-07-06 ENCOUNTER — Ambulatory Visit (INDEPENDENT_AMBULATORY_CARE_PROVIDER_SITE_OTHER): Payer: Medicare Other | Admitting: Adult Health

## 2016-07-06 ENCOUNTER — Encounter: Payer: Self-pay | Admitting: Adult Health

## 2016-07-06 VITALS — BP 110/67 | HR 66 | Ht 71.0 in | Wt 207.6 lb

## 2016-07-06 DIAGNOSIS — R413 Other amnesia: Secondary | ICD-10-CM | POA: Diagnosis not present

## 2016-07-06 DIAGNOSIS — Z8673 Personal history of transient ischemic attack (TIA), and cerebral infarction without residual deficits: Secondary | ICD-10-CM | POA: Diagnosis not present

## 2016-07-06 DIAGNOSIS — R569 Unspecified convulsions: Secondary | ICD-10-CM | POA: Diagnosis not present

## 2016-07-06 NOTE — Progress Notes (Signed)
PATIENT: Thomas Long DOB: 1931/08/28  REASON FOR VISIT: follow up- seizures, stroke, memory HISTORY FROM: patient  HISTORY OF PRESENT ILLNESS: Mr. Thomas Long is an 83 year old male with a history of seizures, stroke and memory loss. He returns today for follow-up. He is currently taking Keppra extended release 500 mg daily. He denies any seizure events. He remains on Xarelto. He is tolerating this well. He is also on Lipitor for his cholesterol. Blood pressure is in normal range today. Patient feels that his memory has remained stable. He continues on Namenda. His wife states that if he is under a lot of stress or if his pacemaker indicates that he has fluid around the heart is seems that his memory might be worse during this time. He lives at home with his wife. He is able to complete all ADLs independently. His wife does help him with his medications. His wife does all the finances. He does not operate a motor vehicle-he states this is due to his vision. Denies any trouble sleeping. Denies any changes with his mood or behavior. He returns today for an evaluation.  HISTORY 01/07/16:  Mr. Thomas Long is an 83 year old male with a history of seizures, stroke and memory loss. He returns today for follow-up. He is currently on Keppra XL 500 mg daily. He denies any seizure events. Overall he feels that he's been doing well. Feels that his memory has remained stable. His wife has noticed that when he is tired his memory is worse. States that he tries to take 1 nap a day. He does have a pacemaker but continues to have trouble with his blood pressure being low. He is able to complete all ADLs independently. His wife does prepare his medication box for him. Denies any trouble sleeping. Denies hallucinations. Denies tremor. At the last visit nerve conduction studies with EMG was ordered after the patient suffered a femoral nerve injury. However the patient deferred on these tests for now. He denies any new neurological  symptoms. He returns today for an evaluation.   HISTORY 07/08/15 [facility]): Mr. Thomas Long is an 83 year old male with a history of seizures, stroke and memory loss. He returns today for follow-up visit.  The patient continues to take Keppra XL. He denies any seizure events since the last visit. He states that he continues to tolerate the medication well. The patient denies any significant changes in his memory. He continues on Namenda. He does report since the last visit he had a pacemaker placed. He did have some complications after the procedure. 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 Since then he states that he is doing better. He is in physical therapy for his leg weakness. He does state that since undergoing anesthesia that may have affected his memory some. He is able to complete all ADLs independently. His wife states that she supervises just because she does not want him to fall. He does not operate a motor vehicle any longer . He denies any new neurological symptoms. A MMSE was performed today ( 07-08-2015) and he scored 28-30 points.  Mr. Thomas Long Plan had 4 several months last year been excessively daytime sleepy and also at nighttime confusional episodes, he sleeps more now after he had a pacemaker inserted on 11-05-2014 but he seems to have more restful sleep and less of the interruptions and confusional spells at night. There may be a correlation to the pacemaker. pulserate is calmer. 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 Mr Mayer Thomas Long had one spell of severe shortness of breath while at church ascending the stairs to [facility] Before the pacemaker was placed. . He had bradycardia of 30 bpm- late September 2016. Leg pain at the groin, fell twice after the pacemaker insertion. 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 In early October he had to undergo surgery. He has not  completely recovered his use of the leg. He uses a cane, has trouble to drive. Had to go rehab.    REVIEW OF SYSTEMS: Out of a complete 14 system review of symptoms, the patient complains only of the following symptoms, and all other reviewed systems are negative.  Memory loss, environmental allergies  ALLERGIES: No Known Allergies  HOME MEDICATIONS: Outpatient Medications Prior to Visit  Medication Sig Dispense Refill  . atorvastatin (LIPITOR) 10 MG tablet Take 1 tablet (10 mg total) by mouth daily. 90 tablet 3  . ketoconazole (NIZORAL) 2 % shampoo Apply 1 application topically daily as needed for irritation. AS NEEDED  5  . latanoprost (XALATAN) 0.005 % ophthalmic solution Place 1 drop into both eyes at bedtime.   3  . levETIRAcetam (KEPPRA XR) 500 MG 24 hr tablet TAKE 1 TABLET BY MOUTH EVERY DAY 90 tablet 3  . loratadine (CLARITIN) 10 MG tablet Take 10 mg by mouth daily.    . memantine (NAMENDA) 10 MG tablet TAKE 1 TABLET TWICE A DAY 180 tablet 1  . Multiple Vitamins-Minerals (CENTRUM SILVER ADULT 50+ PO) Take 1 capsule by mouth daily.    . rivaroxaban (XARELTO) 20 MG TABS tablet Take 1 tablet (20 mg total) by mouth daily with supper. 90 tablet 1  . SIMBRINZA 1-0.2 % SUSP PLACE 1 DROP INTO RIGHT EYE 3 TIMES A DAY  11   No facility-administered medications prior to visit.     PAST MEDICAL HISTORY: Past Medical History:  Diagnosis Date  . CAD (coronary artery disease)    a. LHC 10/2014: nonobstructive disease, 30% LAD with bridging up to 50%.  . Chronic pain   . Complete heart block ([place]) 11/07/2014   a. s/p [facility] Quadra Allure MP RF model [id number] (serial number [id number]) biventricular pacemaker 10/2014.  Marland Kitchen Dementia    "memory lapses q now and then" (11/08/2014)  . Depression   . Facial cellulitis 02/19/2009   "related to [facility] & having skin cancer zapped; took him off the [place]"  . Hyperlipidemia   . Hypertension   . Hypothyroidism   . NICM (nonischemic  cardiomyopathy) ([place])    a. 10/2014: EF 45%. (Prev 40-45% by echo 08/2014).  . Orthostatic hypotension   . Presence of permanent cardiac pacemaker   . Rheumatoid arthritis ([place])    "hands mainly" (11/08/2014)  . Seizures ([place]) 2011   "vs stroke; never determined which it was; on sz RX since"  (11/08/2014)  . Sleep apnea    "gone since losing weight"  . Stroke [facility]) 20111   "vs seizure; never determined which it was; on sz RX since"  (11/08/2014)  . TIA (transient ischemic attack)     PAST SURGICAL HISTORY: Past Surgical History:  Procedure Laterality Date  . APPENDECTOMY  ~ 1988  . BI-VENTRICULAR PACEMAKER INSERTION (CRT-P)  11/08/2014  . CARDIAC CATHETERIZATION  04/01/09   which showed low normal EF at 50% with question of underlying borderline area of minimal inferoapical hypercontractility. He had mild coronary obstructive disease with coronary calcification  and segmental 20% narrowing in the LAD proximally, 20% in the mid LAD with muscle bridging. There was calcification at the ostium of the RCA, and 20 to 30%narrowing of the proximal to mid RCA with 30% distal n  . CARDIAC CATHETERIZATION N/A 11/07/2014   Procedure: Left Heart Cath and Coronary Angiography/ temp wire;  Surgeon: [name], MD;  Location: [place] CV LAB;  Service: Cardiovascular;  Laterality: N/A;  . CATARACT EXTRACTION W/ INTRAOCULAR LENS  IMPLANT, BILATERAL Bilateral early 2000's  . CHOLECYSTECTOMY OPEN  1980's  . EP IMPLANTABLE DEVICE N/A 11/08/2014   Procedure: Pacemaker Implant;  Surgeon: [name], MD; [facility] MP RF model [id number] (serial number [id number]) pacemaker; Laterality: Left  . EP IMPLANTABLE DEVICE N/A 11/10/2014   Procedure: Lead Revision/Repair;  Surgeon: [name], MD;   . EXPLORATORY LAPAROTOMY  1990's   "put intestines back in & added mesh"  . FALSE ANEURYSM REPAIR Right 11/15/2014   Procedure: REPAIR OF RIGHT FEMORAL FALSE ANEURYSM ;  Surgeon: Rosetta Posner, MD;  Location:  [place];  Service: Vascular;  Laterality: Right;  . KNEE CARTILAGE SURGERY Left 1990's   "opened it up"  . TONSILLECTOMY      FAMILY HISTORY: Family History  Problem Relation Age of Onset  . Heart attack Father   . Cancer - Lung Sister   . Thyroid disease Child     SOCIAL HISTORY: Social History   Social History  . Marital status: Married    Spouse name: [name]  . Number of children: 5  . Years of education: College   Occupational History  . Not on file.   Social History Main Topics  . Smoking status: Never Smoker  . Smokeless tobacco: Never Used  . Alcohol use No  . Drug use: No  . Sexual activity: Not on file   Other Topics Concern  . Not on file   Social History Narrative   Patient is married [name]) and lives at home with wife and two children.   Patient has five adult children.   Patient is retired.   Patient has a college education.   Patient is right-handed.   Patient drinks two cups of coffee daily, 1/2 can of soda daily and tea- 2-3 times per week.      PHYSICAL EXAM  Vitals:   07/06/16 1119  BP: 110/67  Pulse: 66  Weight: 207 lb 9.6 oz (94.2 kg)  Height: 5\' 11"  (1.803 m)   Body mass index is 28.95 kg/m.   MMSE - Mini Mental State Exam 07/06/2016 01/07/2016 07/08/2015  Orientation to time 4 3 4   Orientation to Place 5 5 5   Registration 3 3 3   Attention/ Calculation 4 5 5   Recall 0 2 2  Language- name 2 objects 2 2 2   Language- repeat 1 1 1   Language- follow 3 step command 2 3 3   Language- read & follow direction 1 1 1   Write a sentence 1 1 1   Copy design 0 1 1  Total score 23 27 28      Generalized: Well developed, in no acute distress   Neurological examination  Mentation: Alert. Follows all commands speech and language fluent Cranial nerve II-XII: Pupils were equal round reactive to light. Extraocular movements were full, visual field were full on confrontational test. Facial sensation and strength were normal. Uvula tongue  midline. Head turning and shoulder shrug  were normal and symmetric. Motor: The motor testing reveals 5 over 5  strength of all 4 extremities. Good symmetric motor tone is noted throughout.  Sensory: Sensory testing is intact to soft touch on all 4 extremities. No evidence of extinction is noted.  Coordination: Cerebellar testing reveals good finger-nose-finger bilaterally but difficulty with heel to shin bilaterally Gait and station: Patient has a stooped posture. Gait is slightly unstable. Tandem gait is not attempted. Reflexes: Deep tendon reflexes are symmetric and normal bilaterally.   DIAGNOSTIC DATA (LABS, IMAGING, TESTING) - I reviewed patient records, labs, notes, testing and imaging myself where available.  Lab Results  Component Value Date   WBC 7.5 12/25/2014   HGB 12.2 (L) 12/25/2014   HCT 36.0 (L) 12/25/2014   MCV 96.5 12/25/2014   PLT 347 12/25/2014      Component Value Date/Time   NA 137 12/25/2014 1605   K 4.7 12/25/2014 1605   CL 100 12/25/2014 1605   CO2 27 12/25/2014 1605   GLUCOSE 103 (H) 12/25/2014 1605   BUN 24 12/25/2014 1605   CREATININE 1.18 (H) 12/25/2014 1605   CALCIUM 9.7 12/25/2014 1605   PROT 6.1 10/24/2014 0814   ALBUMIN 3.7 10/24/2014 0814   AST 18 10/24/2014 0814   ALT 11 10/24/2014 0814   ALKPHOS 49 10/24/2014 0814   BILITOT 0.8 10/24/2014 0814   GFRNONAA >60 11/16/2014 0234   GFRAA >60 11/16/2014 0234   Lab Results  Component Value Date   CHOL 127 10/24/2014   HDL 42 10/24/2014   LDLCALC 62 10/24/2014   TRIG 113 10/24/2014   CHOLHDL 3.0 10/24/2014    Lab Results  Component Value Date   TSH 0.944 10/24/2014      ASSESSMENT AND PLAN 83 y.o. year old male  has a past medical history of CAD (coronary artery disease); Chronic pain; Complete heart block ([place]) (11/07/2014); Dementia; Depression; Facial cellulitis (02/19/2009); Hyperlipidemia; Hypertension; Hypothyroidism; NICM (nonischemic cardiomyopathy) ([place]); Orthostatic hypotension;  Presence of permanent cardiac pacemaker; Rheumatoid arthritis ([place]); Seizures ([place]) (2011); Sleep apnea; Stroke [facility]) (20111); and TIA (transient ischemic attack). here with:  1. Seizures 2. Memory disturbance 3. History of stroke  The patient's memory score has slightly declined. He will remain on Namenda 10 mg twice a day. We cannot try Aricept due to history of seizures. The patient will remain on Keppra XL 500 mg daily. He is currently on  Xarelto for secondary stroke prevention. He should maintain strict control of his blood pressure with goal less than 130/90, cholesterol LDL less than 70 and hemoglobin A1c less than 6.5%. Patient is advised that if his symptoms worsen or he develops new symptoms he should let us know. He will follow-up in 6 months with Dr. Mechele Claude, MSN, NP-C 07/06/2016, 11:15 AM [facility] [address], [place], [place] [address] [phone]

## 2016-07-06 NOTE — Progress Notes (Signed)
I agree with the assessment and plan as directed by NP .The patient is known to me .   Marshel Golubski, MD  

## 2016-07-06 NOTE — Patient Instructions (Signed)
Memory score slightly declined Continue Namenda 10 mg twice a day  Continue Xarelta GOALS: Blood Pressure <130/90 Cholesterol LDL <70 HgbA1c < 6.5 %

## 2016-07-20 ENCOUNTER — Telehealth: Payer: Self-pay

## 2016-07-20 ENCOUNTER — Telehealth: Payer: Self-pay | Admitting: Internal Medicine

## 2016-07-20 DIAGNOSIS — E785 Hyperlipidemia, unspecified: Secondary | ICD-10-CM

## 2016-07-20 NOTE — Telephone Encounter (Signed)
I spoke with patient's wife.  She has questions about 3 different issues:  1) Does patient need any lab prior to refilling Xarelto? Pt's wife advised I will forward to [facility] to follow-up with her about this.  2) Did pt have any extra fluid when remote transmission was done 06/28/16? Pt's wife states pt is not having any new symptoms, including increase in shortness of breath. Pt's wife advised I will forward to Sharman Cheek, RN to follow-up with her about this.  3) Pt's wife states pt received letter to schedule appointment with Dr Sallyanne Kuster and is not sure why. There is a recall in Epic to schedule an appointment with Dr Sallyanne Kuster in March 2018.  Pt's wife advised I will forward to [facility] scheduling to follow-up with her about that appointment.

## 2016-07-20 NOTE — Telephone Encounter (Signed)
Received message from [facility] office regarding wife requested call back regarding last ICM transmission. Spoke with wife and explained last ICM report.  She stated patient has not had any fluid symptoms and seems to be doing fine.  Advised next ICM remote transmission will be 07/30/2016.  Advised she will be contacted by pharmacist regarding blood work questions and NL regarding appointment with Dr Sallyanne Kuster.

## 2016-07-20 NOTE — Telephone Encounter (Signed)
New message    Pt wife is calling. She has some questions. One question is about fluid around his heart. Does he still have it and what do they do? Another is about his xarelto and if they need new blood work or is his still good? And the last question is about an appt with Dr. Loletha Grayer. They received a letter about scheduling with him.

## 2016-07-20 NOTE — Telephone Encounter (Signed)
Yes patient does need blood work prior to refilling Xarelto. He will need a BMET and CBC, these have not been checked in 2 years. LMOM for pt to return call so we can schedule labs to ensure pt continues on the correct dose of Xarelto.

## 2016-07-20 NOTE — Telephone Encounter (Signed)
Pt's wife called back - labs scheduled for tomorrow. Pt has enough Xarelto for a few weeks prior to needing a refill.

## 2016-07-20 NOTE — Addendum Note (Signed)
Addended by: Alfonso Carden E on: 07/20/2016 12:56 PM   Modules accepted: Orders

## 2016-07-21 ENCOUNTER — Other Ambulatory Visit: Payer: Medicare Other | Admitting: *Deleted

## 2016-07-21 DIAGNOSIS — E785 Hyperlipidemia, unspecified: Secondary | ICD-10-CM

## 2016-07-22 LAB — CBC
Hematocrit: 39.9 % (ref 37.5–51.0)
Hemoglobin: 13.6 g/dL (ref 13.0–17.7)
MCH: 31.9 pg (ref 26.6–33.0)
MCHC: 34.1 g/dL (ref 31.5–35.7)
MCV: 94 fL (ref 79–97)
Platelets: 328 10*3/uL (ref 150–379)
RBC: 4.26 x10E6/uL (ref 4.14–5.80)
RDW: 13.4 % (ref 12.3–15.4)
WBC: 7.4 10*3/uL (ref 3.4–10.8)

## 2016-07-22 LAB — BASIC METABOLIC PANEL
BUN/Creatinine Ratio: 23 (ref 10–24)
BUN: 21 mg/dL (ref 8–27)
CO2: 22 mmol/L (ref 20–29)
Calcium: 9 mg/dL (ref 8.6–10.2)
Chloride: 102 mmol/L (ref 96–106)
Creatinine, Ser: 0.91 mg/dL (ref 0.76–1.27)
GFR calc Af Amer: 90 mL/min/{1.73_m2} (ref >59)
GFR calc non Af Amer: 78 mL/min/{1.73_m2} (ref >59)
Glucose: 102 mg/dL — ABNORMAL HIGH (ref 65–99)
Potassium: 4.8 mmol/L (ref 3.5–5.2)
Sodium: 138 mmol/L (ref 134–144)

## 2016-07-30 ENCOUNTER — Telehealth: Payer: Self-pay | Admitting: Cardiology

## 2016-07-30 NOTE — Progress Notes (Signed)
No ICM remote transmission received for 07/30/2016 and next ICM transmission scheduled for 08/10/2016.

## 2016-07-30 NOTE — Telephone Encounter (Signed)
LMOVM reminding pt to send remote transmission.   

## 2016-08-07 ENCOUNTER — Other Ambulatory Visit: Payer: Self-pay | Admitting: Neurology

## 2016-08-10 ENCOUNTER — Ambulatory Visit (INDEPENDENT_AMBULATORY_CARE_PROVIDER_SITE_OTHER): Payer: Medicare Other

## 2016-08-10 DIAGNOSIS — Z950 Presence of cardiac pacemaker: Secondary | ICD-10-CM | POA: Diagnosis not present

## 2016-08-10 DIAGNOSIS — I5022 Chronic systolic (congestive) heart failure: Secondary | ICD-10-CM | POA: Diagnosis not present

## 2016-08-10 NOTE — Progress Notes (Signed)
EPIC Encounter for ICM Monitoring  Patient Name: Thomas Long is a 83 y.o. male Date: 08/10/2016 Primary Care Physican: Lajean Manes, MD Primary [place] Electrophysiologist: Allred Dry Weight:unknown Bi-V Pacing: 92%       Heart Failure questions reviewed, pt asymptomatic    Thoracic impedance normal.  No diuretic.   Recommendations: No changes.  Encouraged to call for fluid symptoms.  Follow-up plan: ICM clinic phone appointment on 10/12/2016.  Office appointment scheduled 09/06/2016 with Dr. Rayann Heman.  Copy of ICM check sent to device physician.   3 month ICM trend: 08/10/2016      1 Year ICM trend:      Rosalene Billings, RN 08/10/2016 11:00 AM

## 2016-09-06 ENCOUNTER — Ambulatory Visit (INDEPENDENT_AMBULATORY_CARE_PROVIDER_SITE_OTHER): Payer: Medicare Other | Admitting: *Deleted

## 2016-09-06 ENCOUNTER — Telehealth: Payer: Self-pay | Admitting: Cardiology

## 2016-09-06 DIAGNOSIS — I442 Atrioventricular block, complete: Secondary | ICD-10-CM

## 2016-09-06 NOTE — Telephone Encounter (Signed)
Confirmed remote transmission w/ pt wife.   

## 2016-09-07 NOTE — Progress Notes (Signed)
Remote pacemaker transmission.   

## 2016-09-10 ENCOUNTER — Encounter: Payer: Self-pay | Admitting: Cardiology

## 2016-10-04 ENCOUNTER — Encounter: Payer: Self-pay | Admitting: Cardiology

## 2016-10-06 ENCOUNTER — Other Ambulatory Visit: Payer: Self-pay | Admitting: Neurology

## 2016-10-08 LAB — CUP PACEART REMOTE DEVICE CHECK
Battery Remaining Longevity: 89 mo
Battery Remaining Percentage: 95.5 %
Battery Voltage: 2.98 V
Brady Statistic AP VP Percent: 1 %
Brady Statistic AP VS Percent: 1 %
Brady Statistic AS VP Percent: 93 %
Brady Statistic AS VS Percent: 5.9 %
Brady Statistic RA Percent Paced: 1 %
Date Time Interrogation Session: 20180730154217
Implantable Lead Implant Date: 20160930
Implantable Lead Implant Date: 20160930
Implantable Lead Implant Date: 20160930
Implantable Lead Location: 753858
Implantable Lead Location: 753859
Implantable Lead Location: 753860
Implantable Pulse Generator Implant Date: 20160930
Lead Channel Impedance Value: 360 Ohm
Lead Channel Impedance Value: 490 Ohm
Lead Channel Impedance Value: 610 Ohm
Lead Channel Pacing Threshold Amplitude: 0.5 V
Lead Channel Pacing Threshold Amplitude: 0.625 V
Lead Channel Pacing Threshold Amplitude: 1 V
Lead Channel Pacing Threshold Pulse Width: 0.5 ms
Lead Channel Pacing Threshold Pulse Width: 0.5 ms
Lead Channel Pacing Threshold Pulse Width: 0.8 ms
Lead Channel Sensing Intrinsic Amplitude: 3.7 mV
Lead Channel Sensing Intrinsic Amplitude: 8.4 mV
Lead Channel Setting Pacing Amplitude: 2 V
Lead Channel Setting Pacing Amplitude: 2 V
Lead Channel Setting Pacing Amplitude: 2 V
Lead Channel Setting Pacing Pulse Width: 0.5 ms
Lead Channel Setting Pacing Pulse Width: 0.8 ms
Lead Channel Setting Sensing Sensitivity: 5 mV
Pulse Gen Model: 3262
Pulse Gen Serial Number: 7798436

## 2016-10-09 ENCOUNTER — Other Ambulatory Visit: Payer: Self-pay | Admitting: Internal Medicine

## 2016-10-12 ENCOUNTER — Ambulatory Visit (INDEPENDENT_AMBULATORY_CARE_PROVIDER_SITE_OTHER): Payer: Medicare Other

## 2016-10-12 ENCOUNTER — Telehealth: Payer: Self-pay

## 2016-10-12 DIAGNOSIS — I5022 Chronic systolic (congestive) heart failure: Secondary | ICD-10-CM | POA: Diagnosis not present

## 2016-10-12 DIAGNOSIS — Z950 Presence of cardiac pacemaker: Secondary | ICD-10-CM | POA: Diagnosis not present

## 2016-10-12 NOTE — Telephone Encounter (Signed)
Wife called back and reported she is unable to send transmission due to monitor not working correctly.  Advised to call [employer] for assistance.

## 2016-10-12 NOTE — Telephone Encounter (Signed)
Spoke with wife and reminded her to ask pt to send remote transmission today. Pt verbalized understanding.

## 2016-10-14 NOTE — Progress Notes (Signed)
EPIC Encounter for ICM Monitoring  Patient Name: Thomas Long is a 83 y.o. male Date: 10/14/2016 Primary Care Physican: Lajean Manes, MD Primary [place] Electrophysiologist: Allred Dry Weight:unknown Bi-V Pacing: 94%       Spoke to daughter [name].  Heart Failure questions reviewed, pt asymptomatic.   Thoracic impedance normal but was abnormal suggesting fluid accumulation from 8/20 to 8/28.  No diuretic.   Recommendations: No changes.   Encouraged to call for fluid symptoms.  Follow-up plan: ICM clinic phone appointment on 11/15/2016.  Office appointment scheduled 12/29/2016 with Dr. Rayann Heman.  Copy of ICM check sent to Dr. Rayann Heman.   3 month ICM trend: 10/12/2016           1 Year ICM trend:        Rosalene Billings, RN 10/14/2016 9:09 AM

## 2016-11-15 ENCOUNTER — Telehealth: Payer: Self-pay

## 2016-11-15 ENCOUNTER — Ambulatory Visit (INDEPENDENT_AMBULATORY_CARE_PROVIDER_SITE_OTHER): Payer: Medicare Other

## 2016-11-15 DIAGNOSIS — Z950 Presence of cardiac pacemaker: Secondary | ICD-10-CM | POA: Diagnosis not present

## 2016-11-15 DIAGNOSIS — I5022 Chronic systolic (congestive) heart failure: Secondary | ICD-10-CM

## 2016-11-15 NOTE — Progress Notes (Signed)
EPIC Encounter for ICM Monitoring  Patient Name: Thomas Long is a 83 y.o. male Date: 11/15/2016 Primary Care Physican: Lajean Manes, MD Primary [place] Electrophysiologist: Allred Dry Weight:unknown Bi-V Pacing: 94%      Spoke with wife.  Heart Failure questions reviewed, pt asymptomatic   Thoracic impedance normal.  No diuretic.   Recommendations: No changes.  Encouraged to call for fluid symptoms.  Follow-up plan: ICM clinic phone appointment on 12/16/2016.    Copy of ICM check sent to Dr. Rayann Heman.   3 month ICM trend: 11/15/2016   1 Year ICM trend:      Rosalene Billings, RN 11/15/2016 3:51 PM

## 2016-11-15 NOTE — Telephone Encounter (Signed)
Spoke with wife and requested she have patient send remote transmission that is due today.

## 2016-12-16 ENCOUNTER — Ambulatory Visit (INDEPENDENT_AMBULATORY_CARE_PROVIDER_SITE_OTHER): Payer: Medicare Other | Admitting: *Deleted

## 2016-12-16 ENCOUNTER — Telehealth: Payer: Self-pay | Admitting: Cardiology

## 2016-12-16 DIAGNOSIS — I5022 Chronic systolic (congestive) heart failure: Secondary | ICD-10-CM

## 2016-12-16 DIAGNOSIS — Z950 Presence of cardiac pacemaker: Secondary | ICD-10-CM

## 2016-12-16 DIAGNOSIS — I442 Atrioventricular block, complete: Secondary | ICD-10-CM

## 2016-12-16 NOTE — Telephone Encounter (Signed)
Spoke with pt and reminded pt of remote transmission that is due today. Pt verbalized understanding.   

## 2016-12-17 ENCOUNTER — Encounter: Payer: Self-pay | Admitting: Cardiology

## 2016-12-17 NOTE — Progress Notes (Signed)
Remote pacemaker transmission.   

## 2016-12-17 NOTE — Progress Notes (Signed)
EPIC Encounter for ICM Monitoring  Patient Name: Thomas Long is a 83 y.o. male Date: 12/17/2016 Primary Care Physican: Lajean Manes, MD Primary [place] Electrophysiologist: Allred Dry Weight:unknown Bi-V Pacing: 95%                                          Spoke with wife. Heart Failure questions reviewed, pt asymptomatic.   [place]: Thoracic impedance normal but was abnormal suggesting fluid accumulation from 11/22/2016 to 12/07/2016.  No diuretic.   Recommendations: No changes.   Encouraged to call for fluid symptoms.  Follow-up plan: ICM clinic phone appointment on 02/04/2018.  Office appointment scheduled 12/29/2016 with Dr. Rayann Heman.  Copy of ICM check sent to Dr. Rayann Heman.   3 month ICM trend: 12/16/2016    AT/AF   1 Year ICM trend:       Rosalene Billings, RN 12/17/2016 10:26 AM

## 2016-12-20 ENCOUNTER — Encounter: Payer: Self-pay | Admitting: Internal Medicine

## 2016-12-29 ENCOUNTER — Ambulatory Visit: Payer: Medicare Other | Admitting: Internal Medicine

## 2016-12-29 ENCOUNTER — Encounter: Payer: Self-pay | Admitting: Internal Medicine

## 2016-12-29 VITALS — BP 120/62 | HR 72 | Ht 71.0 in | Wt 209.2 lb

## 2016-12-29 DIAGNOSIS — I5042 Chronic combined systolic (congestive) and diastolic (congestive) heart failure: Secondary | ICD-10-CM | POA: Diagnosis not present

## 2016-12-29 DIAGNOSIS — I442 Atrioventricular block, complete: Secondary | ICD-10-CM

## 2016-12-29 DIAGNOSIS — I480 Paroxysmal atrial fibrillation: Secondary | ICD-10-CM

## 2016-12-29 LAB — CUP PACEART INCLINIC DEVICE CHECK
Date Time Interrogation Session: 20181121115138
Implantable Lead Implant Date: 20160930
Implantable Lead Implant Date: 20160930
Implantable Lead Implant Date: 20160930
Implantable Lead Location: 753858
Implantable Lead Location: 753859
Implantable Lead Location: 753860
Implantable Pulse Generator Implant Date: 20160930
Pulse Gen Model: 3262
Pulse Gen Serial Number: 7798436

## 2016-12-29 MED ORDER — IVABRADINE HCL 5 MG PO TABS: 2.5000 mg | ORAL_TABLET | Freq: Two times a day (BID) | ORAL | 3 refills | Status: DC

## 2016-12-29 NOTE — Progress Notes (Signed)
PCP: Lajean Manes, MD Primary Cardiologist:  Dr Claiborne Billings Primary EP:  Dr Rayann Heman  Thomas Long is a 83 y.o. male who presents today for routine electrophysiology followup.  Since last being seen in our clinic, the patient reports doing reasonably well.  He has occasional low BP but is mostly stable.  Today, he denies symptoms of palpitations, chest pain, shortness of breath,  lower extremity edema, dizziness, presyncope, or syncope.  The patient is otherwise without complaint today.   Past Medical History:  Diagnosis Date  . CAD (coronary artery disease)    a. LHC 10/2014: nonobstructive disease, 30% LAD with bridging up to 50%.  . Chronic pain   . Complete heart block ([place]) 11/07/2014   a. s/p [facility] Quadra Allure MP RF model [id number] (serial number [id number]) biventricular pacemaker 10/2014.  Marland Kitchen Dementia    "memory lapses q now and then" (11/08/2014)  . Depression   . Facial cellulitis 02/19/2009   "related to [facility] & having skin cancer zapped; took him off the [place]"  . Hyperlipidemia   . Hypertension   . Hypothyroidism   . NICM (nonischemic cardiomyopathy) ([place])    a. 10/2014: EF 45%. (Prev 40-45% by echo 08/2014).  . Orthostatic hypotension   . Presence of permanent cardiac pacemaker   . Rheumatoid arthritis ([place])    "hands mainly" (11/08/2014)  . Seizures ([place]) 2011   "vs stroke; never determined which it was; on sz RX since"  (11/08/2014)  . Sleep apnea    "gone since losing weight"  . Stroke [facility]) [address]   "vs seizure; never determined which it was; on sz RX since"  (11/08/2014)  . TIA (transient ischemic attack)    Past Surgical History:  Procedure Laterality Date  . APPENDECTOMY  ~ 1988  . BI-VENTRICULAR PACEMAKER INSERTION (CRT-P)  11/08/2014  . CARDIAC CATHETERIZATION  04/01/09   which showed low normal EF at 50% with question of underlying borderline area of minimal inferoapical hypercontractility. He had mild coronary obstructive disease with coronary calcification  and segmental 20% narrowing in the LAD proximally, 20% in the mid LAD with muscle bridging. There was calcification at the ostium of the RCA, and 20 to 30%narrowing of the proximal to mid RCA with 30% distal n  . CARDIAC CATHETERIZATION N/A 11/07/2014   Procedure: Left Heart Cath and Coronary Angiography/ temp wire;  Surgeon: [name], MD;  Location: [place] [facility];  Service: Cardiovascular;  Laterality: N/A;  . CATARACT EXTRACTION W/ INTRAOCULAR LENS  IMPLANT, BILATERAL Bilateral early 2000's  . CHOLECYSTECTOMY OPEN  1980's  . EP IMPLANTABLE DEVICE N/A 11/08/2014   Procedure: Pacemaker Implant;  Surgeon: [name], MD; [facility] MP RF model [id number] (serial number [id number]) pacemaker; Laterality: Left  . EP IMPLANTABLE DEVICE N/A 11/10/2014   Procedure: Lead Revision/Repair;  Surgeon: [name], MD;   . EXPLORATORY LAPAROTOMY  1990's   "put intestines back in & added mesh"  . FALSE ANEURYSM REPAIR Right 11/15/2014   Procedure: REPAIR OF RIGHT FEMORAL FALSE ANEURYSM ;  Surgeon: Rosetta Posner, MD;  Location: [place];  Service: Vascular;  Laterality: Right;  . KNEE CARTILAGE SURGERY Left 1990's   "opened it up"  . TONSILLECTOMY      ROS- all systems are reviewed and negative except as per HPI above  Current Outpatient Medications  Medication Sig Dispense Refill  . atorvastatin (LIPITOR) 10 MG tablet Take 1 tablet (10 mg total) by mouth daily. 90 tablet  3  . ketoconazole (NIZORAL) 2 % shampoo Apply 1 application topically daily as needed for irritation. AS NEEDED  5  . latanoprost (XALATAN) 0.005 % ophthalmic solution Place 1 drop into both eyes at bedtime.   3  . levETIRAcetam (KEPPRA XR) 500 MG 24 hr tablet TAKE 1 TABLET BY MOUTH EVERY DAY 90 tablet 3  . loratadine (CLARITIN) 10 MG tablet Take 10 mg by mouth daily.    . memantine (NAMENDA) 10 MG tablet TAKE 1 TABLET TWICE A DAY 180 tablet 1  . Multiple Vitamins-Minerals (CENTRUM SILVER ADULT 50+ PO) Take 1 capsule  by mouth daily.    Marland Kitchen SIMBRINZA 1-0.2 % SUSP PLACE 1 DROP INTO RIGHT EYE 3 TIMES A DAY  11  . XARELTO 20 MG TABS tablet TAKE 1 TABLET DAILY WITH   SUPPER 90 tablet 1   No current facility-administered medications for this visit.     Physical Exam: Vitals:   12/29/16 0920  BP: 120/62  Pulse: 72  SpO2: 96%  Weight: 209 lb 3.2 oz (94.9 kg)  Height: 5\' 11"  (1.803 m)    GEN- The patient is elderly and frail appearing, alert and oriented x 3 today.   Head- normocephalic, atraumatic Eyes-  Sclera clear, conjunctiva pink Ears- hearing intact Oropharynx- clear Lungs- Clear to ausculation bilaterally, normal work of breathing Chest- pacemaker pocket is well healed Heart- Regular rate and rhythm, no murmurs, rubs or gallops, PMI not laterally displaced GI- soft, NT, ND, + BS Extremities- no clubbing, cyanosis, or edema  Pacemaker interrogation- reviewed in detail today,  See PACEART report   Assessment and Plan:  1. Symptomatic complete heart block Normal pacemaker function See Pace Art report No changes today  2. afib On xarelto for prior stroke chads2vasc score is 5  3. Chronic systolic and diastolic dysfunction (EF by echo 08/29/14 was 40%) Medical therapy limited by hypotension Will give corlanor for tachycardia, CHF, and low BP Will start corlanor 2.5mg  BID x 1 week then 5mg  BID.  This can be titrated if needed. Follow-up with EP NP in 3 months to assess response  Follow-up with Dr Claiborne Billings as scheduled [place] Return to see EP NP in a year  [name] MD, [facility] 12/29/2016 9:30 AM

## 2016-12-29 NOTE — Patient Instructions (Addendum)
Medication Instructions:   1.)  START CORLANOR 2.5 mg half of a tablet TWICE per day for 1 WEEK and then 5 mg TWICE per day   -- If you need a refill on your cardiac medications before your next appointment, please call your pharmacy. --  Labwork: None ordered  Testing/Procedures: None ordered  Follow-Up:  Remote monitoring is used to monitor your Pacemaker  from home. This monitoring reduces the number of office visits required to check your device to one time per year. It allows [place] to keep an eye on the functioning of your device to ensure it is working properly. You are scheduled for a device check from home on 02/04/2017 AND 03/17/2017. You may send your transmission at any time that day. If you have a wireless device, the transmission will be sent automatically. After your physician reviews your transmission, you will receive a postcard with your next transmission date.   Your physician wants you to follow-up in: 3 months with Chanetta Marshall NP      Thank you for choosing [facility]!!   Frederik Schmidt, RN [phone]  Any Other Special Instructions Will Be Listed Below (If Applicable).   Ivabradine tablets What is this medicine? IVABRADINE (eye VAB ra deen) is used to reduce the risk of going to the hospital due to worsening heart failure. This medicine may be used for other purposes; ask your health care provider or pharmacist if you have questions. COMMON BRAND NAME(S): Corlanor What should I tell my health care provider before I take this medicine? They need to know if you have any of these conditions: -certain heart conditions like sick sinus syndrome, sinoatrial block, or third degree atrioventricular block -heart failure that has recently worsened -liver disease -low blood pressure (less than 90/50 mmHg) -low resting heart rate (less than 60 beats per minute) -pacemaker -an unusual or allergic reaction to ivabradine, other medicines, foods, dyes, or  preservatives -pregnant or trying to get pregnant -breast-feeding How should I use this medicine? Take this medicine by mouth with a glass of water. Follow the directions on the prescription label. Avoid grapefruit juice. Take your medicine at regular intervals. Do not take it more often than directed. Do not stop taking except on your doctor's advice. Talk to your pediatrician regarding the use of this medicine in children. Special care may be needed. Overdosage: If you think you have taken too much of this medicine contact a poison control center or emergency room at once. NOTE: This medicine is only for you. Do not share this medicine with others. What if I miss a dose? If you miss a dose, take it as soon as you can. If it is almost time for your next dose, take only that dose. Do not take double or extra doses. What may interact with this medicine? Do not take this medicine with any of the following medications: -certain medicines for fungal infections like ketoconazole and itraconazole -certain antibiotics called macrolide antibiotics like clarithromycin and telithromycin -certain medicines for HIV disease called protease inhibitors like nelfinavir -nefazodone This medicine may also interact with the following medications: -amiodarone -beta-blockers like metoprolol and propranolol -calcium channel blockers like diltiazem and verapamil -certain medicines for seizures like barbiturates and phenytoin -digoxin -grapefruit juice -rifampicin -St. John's wort This list may not describe all possible interactions. Give your health care provider a list of all the medicines, herbs, non-prescription drugs, or dietary supplements you use. Also tell them if you smoke, drink alcohol, or use illegal  drugs. Some items may interact with your medicine. What should I watch for while using this medicine? Tell your doctor or health care professional if your symptoms do not start to get better or if they  get worse. You may experience changes in vision. Use caution if you are driving or using machinery when sudden changes in light intensity may occur, especially while driving at night. This effect may decrease after using this medicine for a long time. Do not become pregnant while taking this medicine. Women who are able to get pregnant must use birth control while taking this medicine. Women should inform their doctor if they wish to become pregnant or think they might be pregnant. There is a potential for serious side effects to an unborn child. Talk to your health care professional or pharmacist for more information. What side effects may I notice from receiving this medicine? Side effects that you should report to your doctor or health care professional as soon as possible: -allergic reactions like skin rash, itching or hives, swelling of the face, lips, or tongue -high blood pressure -signs and symptoms of a dangerous change in heartbeat or heart rhythm like chest pain; dizziness; fast or irregular heartbeat; palpitations; feeling faint or lightheaded; breathing problems -unusually slow heartbeat Side effects that usually do not require medical attention (report to your doctor or health care professional if they continue or are bothersome): -changes in vision This list may not describe all possible side effects. Call your doctor for medical advice about side effects. You may report side effects to FDA at 1-800-FDA-1088. Where should I keep my medicine? Keep out of the reach of children. Store at room temperature between 20 and 25 degrees C (68 and 77 degrees F). Throw away any unused medicine after the expiration date. NOTE: This sheet is a summary. It may not cover all possible information. If you have questions about this medicine, talk to your doctor, pharmacist, or health care provider.  2018 Elsevier/Gold Standard (2015-11-21 10:52:34)

## 2017-01-03 LAB — CUP PACEART REMOTE DEVICE CHECK
Battery Remaining Longevity: 82 mo
Battery Remaining Percentage: 95.5 %
Battery Voltage: 2.96 V
Brady Statistic AP VP Percent: 1 %
Brady Statistic AP VS Percent: 1 %
Brady Statistic AS VP Percent: 95 %
Brady Statistic AS VS Percent: 4.3 %
Brady Statistic RA Percent Paced: 1 %
Date Time Interrogation Session: 20181108182621
Implantable Lead Implant Date: 20160930
Implantable Lead Implant Date: 20160930
Implantable Lead Implant Date: 20160930
Implantable Lead Location: 753858
Implantable Lead Location: 753859
Implantable Lead Location: 753860
Implantable Pulse Generator Implant Date: 20160930
Lead Channel Impedance Value: 360 Ohm
Lead Channel Impedance Value: 490 Ohm
Lead Channel Impedance Value: 590 Ohm
Lead Channel Pacing Threshold Amplitude: 0.5 V
Lead Channel Pacing Threshold Amplitude: 0.625 V
Lead Channel Pacing Threshold Amplitude: 1.125 V
Lead Channel Pacing Threshold Pulse Width: 0.5 ms
Lead Channel Pacing Threshold Pulse Width: 0.5 ms
Lead Channel Pacing Threshold Pulse Width: 0.8 ms
Lead Channel Sensing Intrinsic Amplitude: 3.9 mV
Lead Channel Sensing Intrinsic Amplitude: 5.9 mV
Lead Channel Setting Pacing Amplitude: 2 V
Lead Channel Setting Pacing Amplitude: 2 V
Lead Channel Setting Pacing Amplitude: 2.125
Lead Channel Setting Pacing Pulse Width: 0.5 ms
Lead Channel Setting Pacing Pulse Width: 0.8 ms
Lead Channel Setting Sensing Sensitivity: 5 mV
Pulse Gen Model: 3262
Pulse Gen Serial Number: 7798436

## 2017-01-10 ENCOUNTER — Ambulatory Visit: Payer: Medicare Other | Admitting: Neurology

## 2017-01-10 ENCOUNTER — Encounter: Payer: Self-pay | Admitting: Neurology

## 2017-01-10 VITALS — BP 118/68 | HR 76 | Wt 211.0 lb

## 2017-01-10 DIAGNOSIS — I509 Heart failure, unspecified: Secondary | ICD-10-CM | POA: Diagnosis not present

## 2017-01-10 DIAGNOSIS — G3184 Mild cognitive impairment, so stated: Secondary | ICD-10-CM | POA: Diagnosis not present

## 2017-01-10 DIAGNOSIS — Z950 Presence of cardiac pacemaker: Secondary | ICD-10-CM | POA: Diagnosis not present

## 2017-01-10 DIAGNOSIS — Z8669 Personal history of other diseases of the nervous system and sense organs: Secondary | ICD-10-CM | POA: Diagnosis not present

## 2017-01-10 MED ORDER — LEVETIRACETAM ER 500 MG PO TB24: 500.0000 mg | ORAL_TABLET | Freq: Every day | ORAL | 3 refills | Status: DC

## 2017-01-10 MED ORDER — MEMANTINE HCL 10 MG PO TABS: 10.0000 mg | ORAL_TABLET | Freq: Two times a day (BID) | ORAL | 1 refills | Status: DC

## 2017-01-10 NOTE — Patient Instructions (Signed)
Dementia Dementia means losing some of your brain ability. People with dementia may have problems with:  Memory.  Making decisions.  Behavior.  Speaking.  Thinking.  Solving problems.  Follow these instructions at home: Medicine  Take over-the-counter and prescription medicines only as told by your doctor.  Avoid taking medicines that can change how you think. These include pain or sleeping medicines. Lifestyle   Make healthy choices: ? Be active as told by your doctor. ? Do not use any tobacco products, such as cigarettes, chewing tobacco, and e-cigarettes. If you need help quitting, ask your doctor. ? Eat a healthy diet. ? When you get stressed, do something to help yourself relax. Your doctor can give you tips. ? Spend time with other people.  Drink enough fluid to keep your pee (urine) clear or pale yellow.  Make sure you get good sleep. Use these tips to help you get a good night's rest: ? Try not to take naps during the day. ? Keep your sleeping area dark and cool. ? In the few hours before you go to bed, try not to do any exercise. ? Try not to have foods and drinks with caffeine in the evening. General instructions  Talk with your doctor to figure out: ? What you need help with. ? What your safety needs are.  If you were given a bracelet that tracks your location, make sure to wear it.  Keep all follow-up visits as told by your doctor. This is important. Contact a doctor if:  You have any new problems.  You have problems with choking or swallowing.  You have any symptoms of a different sickness. Get help right away if:  You have a fever.  You feel mixed up (confused) or more mixed up than before.  You have new sleepiness.  You have sleepiness that gets worse.  You have a hard time staying awake.  You or your family members are worried for your safety. This information is not intended to replace advice given to you by your health care  provider. Make sure you discuss any questions you have with your health care provider. Document Released: 01/08/2008 Document Revised: 07/03/2015 Document Reviewed: 10/23/2014 Elsevier Interactive Patient Education  2018 [employer]vier Inc.  
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
## 2017-01-19 ENCOUNTER — Telehealth: Payer: Self-pay | Admitting: Internal Medicine

## 2017-01-19 ENCOUNTER — Other Ambulatory Visit: Payer: Self-pay

## 2017-01-19 MED ORDER — IVABRADINE HCL 5 MG PO TABS: 5.0000 mg | ORAL_TABLET | Freq: Two times a day (BID) | ORAL | 2 refills | Status: DC

## 2017-01-19 NOTE — Telephone Encounter (Signed)
New Message  Pt c/o medication issue:  1. Name of Medication: Corlanor  2. How are you currently taking this medication (dosage and times per day)? 5mg   3. Are you having a reaction (difficulty breathing--STAT)? no  4. What is your medication issue? Per pt wife pt needs a new prescription for pt medication for 1 tab instead of 1/2 tab. Please call back to discuss if needed

## 2017-01-25 ENCOUNTER — Other Ambulatory Visit: Payer: Self-pay | Admitting: Neurology

## 2017-02-04 ENCOUNTER — Ambulatory Visit (INDEPENDENT_AMBULATORY_CARE_PROVIDER_SITE_OTHER): Payer: Medicare Other

## 2017-02-04 ENCOUNTER — Telehealth: Payer: Self-pay | Admitting: Cardiology

## 2017-02-04 DIAGNOSIS — I5042 Chronic combined systolic (congestive) and diastolic (congestive) heart failure: Secondary | ICD-10-CM

## 2017-02-04 DIAGNOSIS — Z950 Presence of cardiac pacemaker: Secondary | ICD-10-CM | POA: Diagnosis not present

## 2017-02-04 NOTE — Progress Notes (Signed)
EPIC Encounter for ICM Monitoring  Patient Name: Thomas Long is a 83 y.o. male Date: 02/04/2017 Primary Care Physican: Lajean Manes, MD Primary [place] Electrophysiologist: Allred Dry Weight:unknown Bi-V Pacing: >99%      Heart Failure questions reviewed, pt asymptomatic.   Thoracic impedance normal but was abnormal suggesting fluid accumulation from 01/27/18 to 02/01/18.  No diuretic.  Recommendations: No changes.  Encouraged to call for fluid symptoms.  Follow-up plan: ICM clinic phone appointment on 03/07/17.  Office appointment scheduled 03/16/2017 with Chanetta Marshall, NP.  Copy of ICM check sent to Dr. Rayann Heman.   3 month ICM trend: 02/04/2017    1 Year ICM trend:       Rosalene Billings, RN 02/04/2017 3:31 PM

## 2017-02-04 NOTE — Telephone Encounter (Signed)
Spoke with pt and reminded pt of remote transmission that is due today. Pt verbalized understanding.   

## 2017-02-15 NOTE — Telephone Encounter (Signed)
Pt's medication has already been sent to pt's pharmacy as requested. Confirmation received.  

## 2017-03-07 ENCOUNTER — Ambulatory Visit (INDEPENDENT_AMBULATORY_CARE_PROVIDER_SITE_OTHER): Payer: Medicare Other

## 2017-03-07 ENCOUNTER — Telehealth: Payer: Self-pay | Admitting: Cardiology

## 2017-03-07 DIAGNOSIS — I5042 Chronic combined systolic (congestive) and diastolic (congestive) heart failure: Secondary | ICD-10-CM

## 2017-03-07 DIAGNOSIS — Z950 Presence of cardiac pacemaker: Secondary | ICD-10-CM

## 2017-03-07 NOTE — Telephone Encounter (Signed)
Spoke with pt and reminded pt of remote transmission that is due today. Pt verbalized understanding.   

## 2017-03-08 NOTE — Progress Notes (Signed)
EPIC Encounter for ICM Monitoring  Patient Name: Thomas Long is a 84 y.o. male Date: 03/08/2017 Primary Care Physican: Lajean Manes, MD Primary [place] Electrophysiologist: Allred Dry Weight:unknown Bi-V Pacing: >99%        Heart Failure questions reviewed, pt asymptomatic.   Thoracic impedance normal today but was abnormal suggesting fluid accumulation from 02/27/2017 until today.  No diuretic.  Recommendations: No changes.  Encouraged to call for fluid symptoms.  Follow-up plan: ICM clinic phone appointment on 04/19/2017.  Office appointment scheduled 04/21/2017 with Dr. Claiborne Billings.  Copy of ICM check sent to Dr. Rayann Heman.   3 month ICM trend: 03/07/2017    1 Year ICM trend:       Rosalene Billings, RN 03/08/2017 10:24 AM

## 2017-03-15 NOTE — Progress Notes (Signed)
Electrophysiology Office Note Date: 03/16/2017  ID:  Thomas Long, DOB 08-18-1931, MRN [id number]  PCP: Thomas Manes, MD Primary Cardiologist: Thomas Long Electrophysiologist: Thomas Long  CC: Pacemaker follow-up  Thomas Long is a 84 y.o. male seen today for EP follow-up. He presents today for routine electrophysiology followup.  Since last being seen in our clinic, the patient reports doing relatively well. He and his wife feel that he is improved after addition of Corlanor. He has more energy and mind is clearer. He still struggles with memory and has good days and bad days.  They have 2 children living at home with them that help with finances.  He denies chest pain, palpitations, dyspnea, PND, orthopnea, nausea, vomiting, syncope, edema, weight gain, or early satiety.   Past Medical History:  Diagnosis Date  . CAD (coronary artery disease)    a. LHC 10/2014: nonobstructive disease, 30% LAD with bridging up to 50%.  . Chronic pain   . Complete heart block ([place]) 11/07/2014   a. s/p [facility] Quadra Allure MP RF model [id number] (serial number [id number]) biventricular pacemaker 10/2014.  Marland Kitchen Dementia    "memory lapses q now and then" (11/08/2014)  . Depression   . Facial cellulitis 02/19/2009   "related to [facility] & having skin cancer zapped; took him off the Embrel"  . Hyperlipidemia   . Hypertension   . Hypothyroidism   . NICM (nonischemic cardiomyopathy) ([place])    a. 10/2014: EF 45%. (Prev 40-45% by echo 08/2014).  . Orthostatic hypotension   . Rheumatoid arthritis ([place])    "hands mainly" (11/08/2014)  . Seizures ([place]) 2011   "vs stroke; never determined which it was; on sz RX since"  (11/08/2014)  . Sleep apnea    "gone since losing weight"  . Stroke [facility]) 20111   "vs seizure; never determined which it was; on sz RX since"  (11/08/2014)  . TIA (transient ischemic attack)    Past Surgical History:  Procedure Laterality Date  . APPENDECTOMY  ~ 1988  . CARDIAC CATHETERIZATION  04/01/09   which showed low normal EF at 50% with question of underlying borderline area of minimal inferoapical hypercontractility. He had mild coronary obstructive disease with coronary calcification and segmental 20% narrowing in the LAD proximally, 20% in the mid LAD with muscle bridging. There was calcification at the ostium of the RCA, and 20 to 30%narrowing of the proximal to mid RCA with 30% distal n  . CARDIAC CATHETERIZATION N/A 11/07/2014   Procedure: Left Heart Cath and Coronary Angiography/ temp wire;  Surgeon: Thomas Sine, MD;  Location: [place] CV [facility];  Service: Cardiovascular;  Laterality: N/A;  . CATARACT EXTRACTION W/ INTRAOCULAR LENS  IMPLANT, BILATERAL Bilateral early 2000's  . CHOLECYSTECTOMY OPEN  1980's  . EP IMPLANTABLE DEVICE N/A 11/08/2014   a. STJ CRTP implanted by Dr Thomas Long  . EP IMPLANTABLE DEVICE N/A 11/10/2014   RA lead revision Dr Thomas Long  . EXPLORATORY LAPAROTOMY  1990's   "put intestines back in & added mesh"  . FALSE ANEURYSM REPAIR Right 11/15/2014   Procedure: REPAIR OF RIGHT FEMORAL FALSE ANEURYSM ;  Surgeon: Thomas Posner, MD;  Location: [place];  Service: Vascular;  Laterality: Right;  . KNEE CARTILAGE SURGERY Left 1990's   "opened it up"  . TONSILLECTOMY      Current Outpatient Medications  Medication Sig Dispense Refill  . atorvastatin (LIPITOR) 10 MG tablet Take 1 tablet (10 mg total) by mouth daily. 90 tablet 3  . ivabradine (  CORLANOR) 5 MG TABS tablet Take 1 tablet (5 mg total) by mouth 2 (two) times daily with a meal. 180 tablet 2  . latanoprost (XALATAN) 0.005 % ophthalmic solution Place 1 drop into both eyes at bedtime.   3  . levETIRAcetam (KEPPRA XR) 500 MG 24 hr tablet Take 1 tablet (500 mg total) by mouth daily. 90 tablet 3  . loratadine (CLARITIN) 10 MG tablet Take 10 mg by mouth daily.    . memantine (NAMENDA) 10 MG tablet Take 1 tablet (10 mg total) by mouth 2 (two) times daily. 180 tablet 1  . Multiple Vitamins-Minerals (CENTRUM SILVER ADULT 50+  PO) Take 1 capsule by mouth daily.    Marland Kitchen SIMBRINZA 1-0.2 % SUSP PLACE 1 DROP INTO RIGHT EYE 3 TIMES A DAY  11  . XARELTO 20 MG TABS tablet TAKE 1 TABLET DAILY WITH   SUPPER 90 tablet 1   No current facility-administered medications for this visit.     Allergies:   Patient has no known allergies.   Social History: Social History   Socioeconomic History  . Marital status: Married    Spouse name: Thomas Long  . Number of children: 5  . Years of education: College  . Highest education level: Not on file  Social Needs  . Financial resource strain: Not on file  . Food insecurity - worry: Not on file  . Food insecurity - inability: Not on file  . Transportation needs - medical: Not on file  . Transportation needs - non-medical: Not on file  Occupational History  . Not on file  Tobacco Use  . Smoking status: Never Smoker  . Smokeless tobacco: Never Used  Substance and Sexual Activity  . Alcohol use: No    Alcohol/week: 0.0 oz  . Drug use: No  . Sexual activity: Not on file  Other Topics Concern  . Not on file  Social History Narrative   Patient is married Thomas Long) and lives at home with wife and two children.   Patient has five adult children.   Patient is retired.   Patient has a college education.   Patient is right-handed.   Patient drinks two cups of coffee daily, 1/2 can of soda daily and tea- 2-3 times per week.    Family History: Family History  Problem Relation Age of Onset  . Heart attack Father   . Cancer - Lung Sister   . Thyroid disease Child      Review of Systems: All other systems reviewed and are otherwise negative except as noted above.   Physical Exam: VS:  BP 118/64   Pulse 86   Ht 5\' 11"  (1.803 m)   Wt 210 lb (95.3 kg)   SpO2 93%   BMI 29.29 kg/m  , BMI Body mass index is 29.29 kg/m.  GEN- The patient is elderly and obese appearing, alert and oriented x 3 today.   HEENT: normocephalic, atraumatic; sclera clear, conjunctiva pink; hearing  intact; oropharynx clear; neck supple Lungs- Clear to ausculation bilaterally, normal work of breathing.  No wheezes, rales, rhonchi Heart- Regular rate and rhythm (paced) GI- soft, non-tender, non-distended, bowel sounds present Extremities- no clubbing, cyanosis, +BLE dependent edema MS- no significant deformity or atrophy Skin- warm and dry, no rash or lesion; PPM pocket well healed Psych- euthymic mood, full affect Neuro- strength and sensation are intact  PPM Interrogation- reviewed in detail today,  See PACEART report  Recent Labs: 07/21/2016: BUN 21; Creatinine, Ser 0.91; Hemoglobin 13.6; Platelets 328;  Potassium 4.8; Sodium 138   Wt Readings from Last 3 Encounters:  03/16/17 210 lb (95.3 kg)  01/10/17 211 lb (95.7 kg)  12/29/16 209 lb 3.2 oz (94.9 kg)     Other studies Reviewed: Additional studies/ records that were reviewed today include: Dr Thomas Long and Dr Evette Georges office notes  Assessment and Plan:  1.  Complete heart block Normal PPM function See Pace Art report No changes today Symptomatically improved post addition of Corlanor  2.  Paroxysmal atrial fibrillation Burden by device interrogation <1% Continue Xarelto for CHADS2VASC of at least 5 BMET, CBC today   3.  Hypotension Chronic    Labs/ tests ordered today include: none Orders Placed This Encounter  Procedures  . Basic metabolic panel  . CBC     Disposition:   Follow up with Dr Thomas Long as scheduled, Delilah Shan, [facility] clinic  Follow-up with me in a year   SignedThompson Grayer MD 03/16/2017 10:16 AM  [place] [address] [phone] (office) [phone] (fax)

## 2017-03-16 ENCOUNTER — Encounter: Payer: Self-pay | Admitting: Nurse Practitioner

## 2017-03-16 ENCOUNTER — Encounter: Payer: Self-pay | Admitting: *Deleted

## 2017-03-16 ENCOUNTER — Ambulatory Visit: Payer: Medicare Other | Admitting: Internal Medicine

## 2017-03-16 VITALS — BP 118/64 | HR 86 | Ht 71.0 in | Wt 210.0 lb

## 2017-03-16 DIAGNOSIS — I442 Atrioventricular block, complete: Secondary | ICD-10-CM | POA: Diagnosis not present

## 2017-03-16 DIAGNOSIS — I951 Orthostatic hypotension: Secondary | ICD-10-CM | POA: Diagnosis not present

## 2017-03-16 DIAGNOSIS — I480 Paroxysmal atrial fibrillation: Secondary | ICD-10-CM

## 2017-03-16 LAB — CUP PACEART INCLINIC DEVICE CHECK
Date Time Interrogation Session: 20190206102458
Implantable Lead Implant Date: 20160930
Implantable Lead Implant Date: 20160930
Implantable Lead Implant Date: 20160930
Implantable Lead Location: 753858
Implantable Lead Location: 753859
Implantable Lead Location: 753860
Implantable Pulse Generator Implant Date: 20160930
Pulse Gen Model: 3262
Pulse Gen Serial Number: 7798436

## 2017-03-16 LAB — BASIC METABOLIC PANEL
BUN/Creatinine Ratio: 20 (ref 10–24)
BUN: 23 mg/dL (ref 8–27)
CO2: 24 mmol/L (ref 20–29)
Calcium: 8.9 mg/dL (ref 8.6–10.2)
Chloride: 101 mmol/L (ref 96–106)
Creatinine, Ser: 1.15 mg/dL (ref 0.76–1.27)
GFR calc Af Amer: 68 mL/min/{1.73_m2} (ref >59)
GFR calc non Af Amer: 59 mL/min/{1.73_m2} — ABNORMAL LOW (ref >59)
Glucose: 109 mg/dL — ABNORMAL HIGH (ref 65–99)
Potassium: 4.7 mmol/L (ref 3.5–5.2)
Sodium: 139 mmol/L (ref 134–144)

## 2017-03-16 LAB — CBC
Hematocrit: 38 % (ref 37.5–51.0)
Hemoglobin: 13.2 g/dL (ref 13.0–17.7)
MCH: 32.4 pg (ref 26.6–33.0)
MCHC: 34.7 g/dL (ref 31.5–35.7)
MCV: 93 fL (ref 79–97)
Platelets: 308 10*3/uL (ref 150–379)
RBC: 4.08 x10E6/uL — ABNORMAL LOW (ref 4.14–5.80)
RDW: 13.5 % (ref 12.3–15.4)
WBC: 8 10*3/uL (ref 3.4–10.8)

## 2017-03-16 NOTE — Patient Instructions (Addendum)
Medication Instructions:   Your physician recommends that you continue on your current medications as directed. Please refer to the Current Medication list given to you today.   If you need a refill on your cardiac medications before your next appointment, please call your pharmacy.  Labwork:   CBC AND BMET TODAY     Testing/Procedures: NONE ORDERED  TODAY    Follow-Up: DR Claiborne Billings AS SCHEDULED   Your physician wants you to follow-up in: [place] will receive a reminder letter in the mail two months in advance. If you don't receive a letter, please call our office to schedule the follow-up appointment.    Remote monitoring is used to monitor your Pacemaker of ICD from home. This monitoring reduces the number of office visits required to check your device to one time per year. It allows [place] to keep an eye on the functioning of your device to ensure it is working properly. You are scheduled for a device check from home on .  03-17-17  You may send your transmission at any time that day. If you have a wireless device, the transmission will be sent automatically. After your physician reviews your transmission, you will receive a postcard with your next transmission date.     Any Other Special Instructions Will Be Listed Below (If Applicable).

## 2017-03-17 ENCOUNTER — Ambulatory Visit (INDEPENDENT_AMBULATORY_CARE_PROVIDER_SITE_OTHER): Payer: Medicare Other | Admitting: *Deleted

## 2017-03-17 ENCOUNTER — Telehealth: Payer: Self-pay | Admitting: Cardiology

## 2017-03-17 DIAGNOSIS — I442 Atrioventricular block, complete: Secondary | ICD-10-CM

## 2017-03-17 NOTE — Telephone Encounter (Signed)
Confirmed remote transmission w/ pt wife.   

## 2017-03-21 NOTE — Progress Notes (Signed)
Remote pacemaker transmission.   

## 2017-03-23 ENCOUNTER — Encounter: Payer: Self-pay | Admitting: Cardiology

## 2017-03-24 ENCOUNTER — Emergency Department (HOSPITAL_COMMUNITY)
Admission: EM | Admit: 2017-03-24 | Discharge: 2017-03-24 | Disposition: A | Discharge status: Home / Self Care | Payer: Medicare Other | Attending: Emergency Medicine | Admitting: Emergency Medicine

## 2017-03-24 ENCOUNTER — Emergency Department (HOSPITAL_COMMUNITY): Payer: Medicare Other

## 2017-03-24 ENCOUNTER — Encounter (HOSPITAL_COMMUNITY): Payer: Self-pay

## 2017-03-24 DIAGNOSIS — I110 Hypertensive heart disease with heart failure: Secondary | ICD-10-CM | POA: Insufficient documentation

## 2017-03-24 DIAGNOSIS — E039 Hypothyroidism, unspecified: Secondary | ICD-10-CM | POA: Insufficient documentation

## 2017-03-24 DIAGNOSIS — Z79899 Other long term (current) drug therapy: Secondary | ICD-10-CM | POA: Diagnosis not present

## 2017-03-24 DIAGNOSIS — I2510 Atherosclerotic heart disease of native coronary artery without angina pectoris: Secondary | ICD-10-CM | POA: Insufficient documentation

## 2017-03-24 DIAGNOSIS — I959 Hypotension, unspecified: Secondary | ICD-10-CM | POA: Insufficient documentation

## 2017-03-24 DIAGNOSIS — I5022 Chronic systolic (congestive) heart failure: Secondary | ICD-10-CM | POA: Insufficient documentation

## 2017-03-24 DIAGNOSIS — J3489 Other specified disorders of nose and nasal sinuses: Secondary | ICD-10-CM

## 2017-03-24 LAB — CBC
HCT: 39.3 % (ref 39.0–52.0)
Hemoglobin: 13.1 g/dL (ref 13.0–17.0)
MCH: 32 pg (ref 26.0–34.0)
MCHC: 33.3 g/dL (ref 30.0–36.0)
MCV: 95.9 fL (ref 78.0–100.0)
Platelets: 243 10*3/uL (ref 150–400)
RBC: 4.1 MIL/uL — ABNORMAL LOW (ref 4.22–5.81)
RDW: 13.1 % (ref 11.5–15.5)
WBC: 6.3 10*3/uL (ref 4.0–10.5)

## 2017-03-24 LAB — BASIC METABOLIC PANEL
Anion gap: 12 (ref 5–15)
BUN: 15 mg/dL (ref 6–20)
CO2: 21 mmol/L — ABNORMAL LOW (ref 22–32)
Calcium: 8.6 mg/dL — ABNORMAL LOW (ref 8.9–10.3)
Chloride: 103 mmol/L (ref 101–111)
Creatinine, Ser: 1.2 mg/dL (ref 0.61–1.24)
GFR calc Af Amer: >60 mL/min (ref >60)
GFR calc non Af Amer: 54 mL/min — ABNORMAL LOW (ref >60)
Glucose, Bld: 121 mg/dL — ABNORMAL HIGH (ref 65–99)
Potassium: 4.1 mmol/L (ref 3.5–5.1)
Sodium: 136 mmol/L (ref 135–145)

## 2017-03-24 LAB — PROTIME-INR
INR: 1.4
Prothrombin Time: 17 seconds — ABNORMAL HIGH (ref 11.4–15.2)

## 2017-03-24 LAB — I-STAT CG4 LACTIC ACID, ED
Lactic Acid, Venous: 2.34 mmol/L (ref 0.5–1.9)
Lactic Acid, Venous: 1.73 mmol/L (ref 0.5–1.9)

## 2017-03-24 LAB — I-STAT TROPONIN, ED: Troponin i, poc: 0.01 ng/mL (ref 0.00–0.08)

## 2017-03-24 MED ORDER — SODIUM CHLORIDE 0.9 % IV BOLUS (SEPSIS)
500.0000 mL | Freq: Once | INTRAVENOUS | Status: AC
  Administered 2017-03-24: 500 mL via INTRAVENOUS

## 2017-03-24 NOTE — ED Notes (Signed)
Lab results reported to Nurse Marya Amsler.

## 2017-03-24 NOTE — Discharge Instructions (Signed)
Continue taking your usual medications.  It is important to eat 3 meals a day, and make sure you are drinking plenty of fluids.  See your doctor, or return here as needed for problems.

## 2017-03-24 NOTE — ED Notes (Signed)
Mini lab called reported I-stat lactic acid 2.34.

## 2017-03-24 NOTE — ED Notes (Signed)
Notified Dr Ayesha Rumpf of critical lab I-stat lactic acid 2.34

## 2017-03-24 NOTE — ED Notes (Signed)
Pt's O2 was 97% while ambulating. Pt stated that he felt fine and showed no difficulty breathing. MD. Eulis Foster has been notified.

## 2017-03-24 NOTE — ED Notes (Signed)
Lunch ordered; heart healthy diet

## 2017-03-24 NOTE — ED Provider Notes (Signed)
[facility] EMERGENCY DEPARTMENT Provider Note   CSN: [id number] Arrival date & time: 03/24/17  4818     History   Chief Complaint No chief complaint on file.   HPI Thomas Long is a 84 y.o. male.  Patient here for evaluation of low blood pressure noticed that his doctor's office today.  He went there for evaluation of rhinorrhea, early this morning.  While there he was noted that his blood pressure was difficult to obtain so he was sent here for evaluation of possible sepsis.  His PCP recommended that he start Flonase for the rhinorrhea.  Patient has been eating well and taking his usual medications.  There is been no recent fever, chills, cough with sputum production, nausea, vomiting, focal weakness or paresthesia.  His blood pressure is typically 563-149 systolic, when taken at home by his wife.  No recent known sick contacts.  Family members are concerned that he has an infected tooth, left upper.  He has been scheduled to see an oral surgeon, today for extraction but came here instead.  There are no other known modifying factors.  HPI  Past Medical History:  Diagnosis Date  . CAD (coronary artery disease)    a. LHC 10/2014: nonobstructive disease, 30% LAD with bridging up to 50%.  . Chronic pain   . Complete heart block ([place]) 11/07/2014   a. s/p [facility] Quadra Allure MP RF model [id number] (serial number [id number]) biventricular pacemaker 10/2014.  Marland Kitchen Dementia    "memory lapses q now and then" (11/08/2014)  . Depression   . Facial cellulitis 02/19/2009   "related to [facility] & having skin cancer zapped; took him off the Embrel"  . Hyperlipidemia   . Hypertension   . Hypothyroidism   . NICM (nonischemic cardiomyopathy) ([place])    a. 10/2014: EF 45%. (Prev 40-45% by echo 08/2014).  . Orthostatic hypotension   . Rheumatoid arthritis ([place])    "hands mainly" (11/08/2014)  . Seizures ([place]) 2011   "vs stroke; never determined which it was; on sz RX since"  (11/08/2014)   . Sleep apnea    "gone since losing weight"  . Stroke [facility]) 20111   "vs seizure; never determined which it was; on sz RX since"  (11/08/2014)  . TIA (transient ischemic attack)     Patient Active Problem List   Diagnosis Date Noted  . 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 History of absence seizures 01/10/2017  . CHF (congestive heart failure), NYHA class II, unspecified failure chronicity, unspecified type ([place]) 01/10/2017  . Femoral nerve injury 07/08/2015  . Pseudoaneurysm of femoral artery ([place]) 07/08/2015  . CHF (congestive heart failure), NYHA class II ([place]) 07/08/2015  . PAF (paroxysmal atrial fibrillation) ([place]) 02/19/2015  . Anticoagulation adequate 02/19/2015  . Pseudoaneurysm following procedure ([place]) 11/15/2014  . Essential hypertension 11/15/2014  . Chronic systolic CHF (congestive heart failure) ([place]) 11/15/2014  . History of stroke 11/15/2014  . Leukocytosis 11/15/2014  . Mild anemia 11/15/2014  . Complete heart block ([place]) 11/07/2014  . Exertional shortness of breath 10/26/2014  . Atypical chest pain 07/11/2014  . Other forms of epilepsy and recurrent seizures without mention of intractable epilepsy 10/03/2013  . Hyperlipidemia with target LDL less than 70 04/29/2013  . CAD in native artery 08/10/2012  . Postural dizziness 08/10/2012  . Convulsions/seizures ([place]) 07/26/2012  . TIA (transient ischemic attack)     Past Surgical History:  Procedure Laterality Date  . APPENDECTOMY  ~  [place]  04/01/09   which showed low normal EF at 50% with question of underlying borderline area of minimal inferoapical hypercontractility. He had mild coronary obstructive disease with coronary calcification and segmental 20% narrowing in the LAD proximally, 20% in the mid LAD with muscle bridging. There was calcification at the ostium of the RCA, and 20 to 30%narrowing of the proximal to mid RCA with 30% distal n  . CARDIAC  CATHETERIZATION N/A 11/07/2014   Procedure: Left Heart Cath and Coronary Angiography/ temp wire;  Surgeon: [name], MD;  Location: [facility] CV LAB;  Service: Cardiovascular;  Laterality: N/A;  . CATARACT EXTRACTION W/ INTRAOCULAR LENS  IMPLANT, BILATERAL Bilateral early 2000's  . CHOLECYSTECTOMY OPEN  1980's  . EP IMPLANTABLE DEVICE N/A 11/08/2014   a. STJ CRTP implanted by Dr Rayann Heman  . EP IMPLANTABLE DEVICE N/A 11/10/2014   RA lead revision Dr Rayann Heman  . EXPLORATORY LAPAROTOMY  1990's   "put intestines back in & added mesh"  . FALSE ANEURYSM REPAIR Right 11/15/2014   Procedure: REPAIR OF RIGHT FEMORAL FALSE ANEURYSM ;  Surgeon: Rosetta Posner, MD;  Location: [facility];  Service: Vascular;  Laterality: Right;  . KNEE CARTILAGE SURGERY Left 1990's   "opened it up"  . TONSILLECTOMY         Home Medications    Prior to Admission medications   Medication Sig Start Date End Date Taking? Authorizing Provider  atorvastatin (LIPITOR) 10 MG tablet Take 1 tablet (10 mg total) by mouth daily. 04/29/16   [name], MD  ivabradine (CORLANOR) 5 MG TABS tablet Take 1 tablet (5 mg total) by mouth 2 (two) times daily with a meal. 01/19/17   Allred, Jeneen Rinks, MD  latanoprost (XALATAN) 0.005 % ophthalmic solution Place 1 drop into both eyes at bedtime.  04/12/14   [provider]  levETIRAcetam (KEPPRA XR) 500 MG 24 hr tablet Take 1 tablet (500 mg total) by mouth daily. 01/10/17   Dohmeier, Asencion Partridge, MD  loratadine (CLARITIN) 10 MG tablet Take 10 mg by mouth daily.    [provider]  memantine (NAMENDA) 10 MG tablet Take 1 tablet (10 mg total) by mouth 2 (two) times daily. 01/10/17   Dohmeier, Asencion Partridge, MD  Multiple Vitamins-Minerals (CENTRUM SILVER ADULT 50+ PO) Take 1 capsule by mouth daily.    [provider]  SIMBRINZA 1-0.2 % SUSP PLACE 1 DROP INTO RIGHT EYE 3 TIMES A DAY 09/03/15   [provider]  XARELTO 20 MG TABS tablet TAKE 1 TABLET DAILY WITH   SUPPER 10/12/16    [name], MD    Family History Family History  Problem Relation Age of Onset  . Heart attack Father   . Cancer - Lung Sister   . Thyroid disease Child     Social History Social History   Tobacco Use  . Smoking status: Never Smoker  . Smokeless tobacco: Never Used  Substance Use Topics  . Alcohol use: No    Alcohol/week: 0.0 oz  . Drug use: No     Allergies   Patient has no known allergies.   Review of Systems Review of Systems  All other systems reviewed and are negative.    Physical Exam Updated Vital Signs BP 122/65 (BP Location: Right Arm)   Pulse 83   Temp 97.8 F (36.6 C) (Oral)   Resp (!) 26   Ht 5\' 11"  (1.803 m)   Wt 86.2 kg (190 lb)  SpO2 96%   BMI 26.50 kg/m   Physical Exam  Constitutional: He appears well-developed. No distress.  Elderly, frail  HENT:  Head: Normocephalic and atraumatic.  Right Ear: External ear normal.  Left Ear: External ear normal.    Left upper incisor, present below gumline, with apparent dental carie.  No associated gum swelling or trismus.  Eyes: Conjunctivae and EOM are normal. Pupils are equal, round, and reactive to light.  Neck: Normal range of motion and phonation normal. Neck supple.  Cardiovascular: Normal rate, regular rhythm and normal heart sounds.  Pulmonary/Chest: Effort normal and breath sounds normal. He exhibits no bony tenderness.  Abdominal: Soft. There is no tenderness.  Musculoskeletal: Normal range of motion. He exhibits edema (1+).  Neurological: He is alert. No cranial nerve deficit or sensory deficit. He exhibits normal muscle tone. Coordination normal.  No dysarthria or aphasia.  Skin: Skin is warm, dry and intact.  Psychiatric: He has a normal mood and affect. His behavior is normal.  Nursing note and vitals reviewed.    ED Treatments / Results  Labs (all labs ordered are listed, but only abnormal results are displayed) Labs Reviewed  BASIC METABOLIC PANEL - Abnormal; Notable  for the following components:      Result Value   CO2 21 (*)    Glucose, Bld 121 (*)    Calcium 8.6 (*)    GFR calc non Af Amer 54 (*)    All other components within normal limits  CBC - Abnormal; Notable for the following components:   RBC 4.10 (*)    All other components within normal limits  PROTIME-INR - Abnormal; Notable for the following components:   Prothrombin Time 17.0 (*)    All other components within normal limits  I-STAT CG4 LACTIC ACID, ED - Abnormal; Notable for the following components:   Lactic Acid, Venous 2.34 (*)    All other components within normal limits  I-STAT TROPONIN, ED  I-STAT CG4 LACTIC ACID, ED    EKG  EKG Interpretation  Date/Time:  Thursday March 24 2017 09:42:04 EST Ventricular Rate:  92 PR Interval:  196 QRS Duration: 150 QT Interval:  430 QTC Calculation: 531 R Axis:   171 Text Interpretation:  Atrial-sensed ventricular-paced rhythm Abnormal ECG since last tracing no significant change Confirmed by Daleen Bo [phone]) on 03/24/2017 1:05:44 PM       Radiology Dg Chest 2 View  Result Date: 03/24/2017 CLINICAL DATA:  Hypotension.  Dementia, TIA EXAM: CHEST  2 VIEW COMPARISON:  None. FINDINGS: LEFT-sided pacemaker overlies stable cardiac silhouette. No effusion, infiltrate or pneumothorax. No acute osseous abnormality. IMPRESSION: No acute cardiopulmonary process. Electronically Signed   By: Suzy Bouchard M.D.   On: 03/24/2017 10:27    Procedures Procedures (including critical care time)  Medications Ordered in ED Medications - No data to display   Initial Impression / Assessment and Plan / ED Course  I have reviewed the triage vital signs and the nursing notes.  Pertinent labs & imaging results that were available during my care of the patient were reviewed by me and considered in my medical decision making (see chart for details).  Clinical Course as of Mar 24 1401  Thu Mar 24, 2017  1305 [employer] Jude Pacemaker- PM  [EW]    [phone] Patient's pacemaker was interrogated, reviewed by [facility]. Jude's rep, and showed only a few periods of atrial fibrillation.  [EW]  1403 Patient has been offered nutrition with diet and fluids.  [EW]  Clinical Course User Index [EW] Daleen Bo, MD     Patient Vitals for the past 24 hrs:  BP Temp Temp src Pulse Resp SpO2 Height Weight  03/24/17 1245 - - - 82 20 95 % - -  03/24/17 1230 (!) 121/95 - - 79 20 96 % - -  03/24/17 1215 - - - 80 (!) 21 98 % - -  03/24/17 1200 107/71 - - 79 (!) 24 97 % - -  03/24/17 1152 122/65 - - 83 (!) 26 96 % - -  03/24/17 1145 - - - 80 (!) 26 98 % - -  03/24/17 1020 104/60 - - 88 16 96 % - -  03/24/17 0937 (!) 89/58 97.8 F (36.6 C) Oral 95 16 95 % 5\' 11"  (1.803 m) 86.2 kg (190 lb)    4:49 PM Reevaluation with update and discussion. After initial assessment and treatment, an updated evaluation reveals he has been able to ambulate, and eat, and does not have significant orthostasis with standing.  Findings were discussed with patient and family members, all questions answered. Daleen Bo     Final Clinical Impressions(s) / ED Diagnoses   Final diagnoses:  None    Transient hypotension, nonspecific and not persistent.  Screening evaluation for serious medical problems, is reassuring.  Lactate is normal, doubt sepsis.  Metabolic panel indicates mild elevation glucose and decreased CO2, both nonspecific.  Creatinine is normal 1.2.  I-STAT troponin normal doubt ACS.  CBC normal, doubt serious bacterial infection.  INR is 1.4, he does not take warfarin.  The chest x-ray does not indicate pneumonia or CHF.  EKG shows normal paced rhythm, and pacemaker interrogation shows only periods of atrial fibrillation which are intermittent and no other sustained tachyarrhythmia.  Nursing Notes Reviewed/ Care Coordinated Applicable Imaging Reviewed Interpretation of Laboratory Data incorporated into ED treatment  The patient appears reasonably screened and/or  stabilized for discharge and I doubt any other medical condition or other [facility] requiring further screening, evaluation, or treatment in the ED at this time prior to discharge.  Plan: Home Medications-continue usual medications; Home Treatments-encouraged 3 meals and plenty of fluids daily.; return here if the recommended treatment, does not improve the symptoms; Recommended follow up-PCP follow-up as needed.  Continue with recommendations by PCP to use Flonase for nasal congestion.   ED Discharge Orders    None       Daleen Bo, MD 03/24/17 1654

## 2017-03-24 NOTE — ED Triage Notes (Addendum)
Patient arrived with complaint of hypotension. Denies pain. Appears recent visit for complete heart block. BP 89 on arrival. No fever, no cold symptoms. Per spouse sent from [place] for evaluation of low BP. Has had cold symptoms earlier in week.

## 2017-03-24 NOTE — ED Notes (Signed)
Notified Charge Nurse Critical lab I-stat lactic acid 2.34 and the need for next available room.

## 2017-04-08 ENCOUNTER — Other Ambulatory Visit: Payer: Self-pay | Admitting: *Deleted

## 2017-04-08 DIAGNOSIS — I5022 Chronic systolic (congestive) heart failure: Secondary | ICD-10-CM

## 2017-04-11 ENCOUNTER — Encounter: Payer: Self-pay | Admitting: Physician Assistant

## 2017-04-11 ENCOUNTER — Ambulatory Visit: Payer: Medicare Other | Admitting: Physician Assistant

## 2017-04-11 ENCOUNTER — Ambulatory Visit
Admission: RE | Admit: 2017-04-11 | Discharge: 2017-04-11 | Disposition: A | Discharge status: Home / Self Care | Payer: Medicare Other | Source: Ambulatory Visit | Attending: Physician Assistant | Admitting: Physician Assistant

## 2017-04-11 VITALS — BP 96/68 | HR 93 | Ht 71.0 in | Wt 206.0 lb

## 2017-04-11 DIAGNOSIS — I428 Other cardiomyopathies: Secondary | ICD-10-CM

## 2017-04-11 DIAGNOSIS — E039 Hypothyroidism, unspecified: Secondary | ICD-10-CM

## 2017-04-11 DIAGNOSIS — I10 Essential (primary) hypertension: Secondary | ICD-10-CM

## 2017-04-11 DIAGNOSIS — E785 Hyperlipidemia, unspecified: Secondary | ICD-10-CM

## 2017-04-11 DIAGNOSIS — R42 Dizziness and giddiness: Secondary | ICD-10-CM | POA: Diagnosis not present

## 2017-04-11 DIAGNOSIS — I480 Paroxysmal atrial fibrillation: Secondary | ICD-10-CM | POA: Diagnosis not present

## 2017-04-11 DIAGNOSIS — I2510 Atherosclerotic heart disease of native coronary artery without angina pectoris: Secondary | ICD-10-CM

## 2017-04-11 DIAGNOSIS — R531 Weakness: Secondary | ICD-10-CM

## 2017-04-11 DIAGNOSIS — R0602 Shortness of breath: Secondary | ICD-10-CM

## 2017-04-11 DIAGNOSIS — Z950 Presence of cardiac pacemaker: Secondary | ICD-10-CM | POA: Diagnosis not present

## 2017-04-11 LAB — CUP PACEART REMOTE DEVICE CHECK
Battery Remaining Longevity: 85 mo
Battery Remaining Percentage: 95.5 %
Battery Voltage: 2.96 V
Brady Statistic AP VP Percent: 6.6 %
Brady Statistic AP VS Percent: 1 %
Brady Statistic AS VP Percent: 92 %
Brady Statistic AS VS Percent: 1 %
Brady Statistic RA Percent Paced: 6.1 %
Date Time Interrogation Session: 20190207200052
Implantable Lead Implant Date: 20160930
Implantable Lead Implant Date: 20160930
Implantable Lead Implant Date: 20160930
Implantable Lead Location: 753858
Implantable Lead Location: 753859
Implantable Lead Location: 753860
Implantable Pulse Generator Implant Date: 20160930
Lead Channel Impedance Value: 360 Ohm
Lead Channel Impedance Value: 490 Ohm
Lead Channel Impedance Value: 690 Ohm
Lead Channel Pacing Threshold Amplitude: 0.5 V
Lead Channel Pacing Threshold Amplitude: 0.75 V
Lead Channel Pacing Threshold Amplitude: 1.375 V
Lead Channel Pacing Threshold Pulse Width: 0.5 ms
Lead Channel Pacing Threshold Pulse Width: 0.5 ms
Lead Channel Pacing Threshold Pulse Width: 0.8 ms
Lead Channel Sensing Intrinsic Amplitude: 3.6 mV
Lead Channel Sensing Intrinsic Amplitude: 7.2 mV
Lead Channel Setting Pacing Amplitude: 2 V
Lead Channel Setting Pacing Amplitude: 2 V
Lead Channel Setting Pacing Amplitude: 2.375
Lead Channel Setting Pacing Pulse Width: 0.5 ms
Lead Channel Setting Pacing Pulse Width: 0.8 ms
Lead Channel Setting Sensing Sensitivity: 5 mV
Pulse Gen Model: 3262
Pulse Gen Serial Number: 7798436

## 2017-04-11 NOTE — Progress Notes (Signed)
Cardiology Office Note    Date:  04/13/2017   ID:  Thomas Long, DOB 03/18/1931, MRN [id number]  PCP:  Lajean Manes, MD  Cardiologist:  Dr. Claiborne Billings Primary electrophysiologist: Dr. Rayann Heman  Chief Complaint  Patient presents with  . Follow-up    Coughing and low blood pressure. Complains about being cold and feet and hands stay cold all the time.  . Fatigue  . Shortness of Breath  . Headache    History of Present Illness:  Thomas Long is a 84 y.o. male with PMH of CAD, CHB s/p St PPM, PAF, HTN, HLD, NICM, hypothyroidism, and TIA.  He had a cardiac catheterization in 2011 that showed EF 50%, mild coronary artery disease involving 20% in proximal LAD and mid LAD with mild myocardial bridging, calcification at the ostium of the RCA with 20-30% narrowing in the proximal to mid RCA.  He also had history of possible seizure and TIA on Aggrenox and Keppra.  Due to orthostatic symptoms, his Bystolic was discontinued in 2014.  Follow-up echocardiogram in July 2016 showed normal LV cavity size, however LV function slightly declined to 40-45%.  He was admitted to [facility] in September 2016 with symptomatic complete heart block.  His heart rate was in the 30s.  Cardiac catheterization on 11/07/2014 showed only 30% distal LAD lesion, otherwise no significant obstructive disease to explain the complete heart block.  He therefore underwent [employer] Jude biventricular pacemaker placed by Dr. Rayann Heman.  She returned back to the hospital in October 2016 with right atrial lead dislodgment and a new right atrial lead was placed.  Postop course complicated by right groin pseudoaneurysm and underwent surgical repair by Dr. Donnetta Hutching on 11/15/2014.  Follow-up interrogation of the device showed paroxysmal atrial fibrillation, therefore his Aggrenox was discontinued and he was started on Xarelto.  Last echocardiogram obtained in November 2016 showed EF 55-60%, grade 2 DD, mild MR  He was last seen by Dr. Claiborne Billings on  04/30/2016.  He was doing well at the time. He was started on Corlanor In Nov 2018 by Dr. Rayann Heman for tachycardia in the setting of heart failure. Patient presents today for cardiology office visit.  For the past 4-5 weeks, he has been having gradually worsening fatigue.  He also have some dizziness, weakness, and a low blood pressure.  Family also complained of a nonproductive cough.  On physical exam, he has a very unusual transient crackle in the right base, it does not sound like typical pulmonary edema, I plan to obtain a repeat 2 view chest x-Long.  As far as his worsening weakness, I am also concerned about the borderline blood pressure.  His blood pressure today is 196/68.  Pulse 93.  Since he is on Xarelto, probability of PE causing his symptom is very low.  He denies any obvious chest pain.  He is due for echocardiogram this Friday and a follow-up with Dr. Claiborne Billings.  Family is concerned about his symptom, therefore scheduled a earlier appointment with me.  Unfortunately there is a lot of secondary causes that could cause his symptoms.  We did obtain orthostatic vital sign in the office, see below.  Orthostatic vital sign Lying 106/67 heart rate 78 Sitting 102/73 heart rate 91 Standing 106/68 heart rate 97.  No EKG  Past Medical History:  Diagnosis Date  . CAD (coronary artery disease)    a. LHC 10/2014: nonobstructive disease, 30% LAD with bridging up to 50%.  . Chronic pain   . Complete heart  block ([place]) 11/07/2014   a. s/p [facility] Quadra Allure MP RF model [id number] (serial number [id number]) biventricular pacemaker 10/2014.  Marland Kitchen Dementia    "memory lapses q now and then" (11/08/2014)  . Depression   . Facial cellulitis 02/19/2009   "related to [facility] & having skin cancer zapped; took him off the [place]"  . Hyperlipidemia   . Hypertension   . Hypothyroidism   . NICM (nonischemic cardiomyopathy) ([place])    a. 10/2014: EF 45%. (Prev 40-45% by echo 08/2014).  . Orthostatic hypotension   .  Rheumatoid arthritis ([place])    "hands mainly" (11/08/2014)  . Seizures ([place]) 2011   "vs stroke; never determined which it was; on sz RX since"  (11/08/2014)  . Sleep apnea    "gone since losing weight"  . Stroke [facility]) [address]   "vs seizure; never determined which it was; on sz RX since"  (11/08/2014)  . TIA (transient ischemic attack)     Past Surgical History:  Procedure Laterality Date  . APPENDECTOMY  ~ 1988  . CARDIAC CATHETERIZATION  04/01/09   which showed low normal EF at 50% with question of underlying borderline area of minimal inferoapical hypercontractility. He had mild coronary obstructive disease with coronary calcification and segmental 20% narrowing in the LAD proximally, 20% in the mid LAD with muscle bridging. There was calcification at the ostium of the RCA, and 20 to 30%narrowing of the proximal to mid RCA with 30% distal n  . CARDIAC CATHETERIZATION N/A 11/07/2014   Procedure: Left Heart Cath and Coronary Angiography/ temp wire;  Surgeon: [name], MD;  Location: [place] [facility];  Service: Cardiovascular;  Laterality: N/A;  . CATARACT EXTRACTION W/ INTRAOCULAR LENS  IMPLANT, BILATERAL Bilateral early 2000's  . CHOLECYSTECTOMY OPEN  1980's  . EP IMPLANTABLE DEVICE N/A 11/08/2014   a. STJ CRTP implanted by Dr Rayann Heman  . EP IMPLANTABLE DEVICE N/A 11/10/2014   RA lead revision Dr Rayann Heman  . EXPLORATORY LAPAROTOMY  1990's   "put intestines back in & added mesh"  . FALSE ANEURYSM REPAIR Right 11/15/2014   Procedure: REPAIR OF RIGHT FEMORAL FALSE ANEURYSM ;  Surgeon: Rosetta Posner, MD;  Location: [place];  Service: Vascular;  Laterality: Right;  . KNEE CARTILAGE SURGERY Left 1990's   "opened it up"  . TONSILLECTOMY      Current Medications: Outpatient Medications Prior to Visit  Medication Sig Dispense Refill  . ivabradine (CORLANOR) 5 MG TABS tablet Take 1 tablet (5 mg total) by mouth 2 (two) times daily with a meal. 180 tablet 2  . latanoprost (XALATAN) 0.005 % ophthalmic  solution Place 1 drop into both eyes at bedtime.   3  . levETIRAcetam (KEPPRA XR) 500 MG 24 hr tablet Take 1 tablet (500 mg total) by mouth daily. 90 tablet 3  . loratadine (CLARITIN) 10 MG tablet Take 10 mg by mouth daily.    . memantine (NAMENDA) 10 MG tablet Take 1 tablet (10 mg total) by mouth 2 (two) times daily. 180 tablet 1  . Multiple Vitamins-Minerals (CENTRUM SILVER ADULT 50+ PO) Take 1 capsule by mouth daily.    Marland Kitchen SIMBRINZA 1-0.2 % SUSP PLACE 1 DROP INTO RIGHT EYE 3 TIMES A DAY  11  . atorvastatin (LIPITOR) 10 MG tablet Take 1 tablet (10 mg total) by mouth daily. 90 tablet 3  . XARELTO 20 MG TABS tablet TAKE 1 TABLET DAILY WITH   SUPPER 90 tablet 1   No facility-administered medications prior  to visit.      Allergies:   Patient has no known allergies.   Social History   Socioeconomic History  . Marital status: Married    Spouse name: [name]  . Number of children: 5  . Years of education: College  . Highest education level: None  Social Needs  . Financial resource strain: None  . Food insecurity - worry: None  . Food insecurity - inability: None  . Transportation needs - medical: None  . Transportation needs - non-medical: None  Occupational History  . None  Tobacco Use  . Smoking status: Never Smoker  . Smokeless tobacco: Never Used  Substance and Sexual Activity  . Alcohol use: No    Alcohol/week: 0.0 oz  . Drug use: No  . Sexual activity: None  Other Topics Concern  . None  Social History Narrative   Patient is married [name]) and lives at home with wife and two children.   Patient has five adult children.   Patient is retired.   Patient has a college education.   Patient is right-handed.   Patient drinks two cups of coffee daily, 1/2 can of soda daily and tea- 2-3 times per week.     Family History:  The patient's family history includes Cancer - Lung in his sister; Heart attack in his father; Thyroid disease in his child.   ROS:   Please see the  history of present illness.    ROS All other systems reviewed and are negative.   PHYSICAL EXAM:   VS:  BP 96/68   Pulse 93   Ht 5\' 11"  (1.803 m)   Wt 206 lb (93.4 kg)   BMI 28.73 kg/m    GEN: Well nourished, well developed, in no acute distress  HEENT: normal  Neck: no JVD, carotid bruits, or masses Cardiac: RRR; no murmurs, rubs, or gallops,no edema  Respiratory:  clear to auscultation bilaterally, normal work of breathing GI: soft, nontender, nondistended, + BS MS: no deformity or atrophy  Skin: warm and dry, no rash Neuro:  Alert and Oriented x 3, Strength and sensation are intact Psych: euthymic mood, full affect  Wt Readings from Last 3 Encounters:  04/11/17 206 lb (93.4 kg)  03/24/17 190 lb (86.2 kg)  03/16/17 210 lb (95.3 kg)      Studies/Labs Reviewed:   EKG:  EKG is ordered today.  The ekg ordered today demonstrates atrial senses ventricularly paced rhythm  Recent Labs: 04/11/2017: ALT 14; BNP 45.1; BUN 15; Creatinine, Ser 0.92; Hemoglobin 13.1; Platelets 310; Potassium 4.2; Sodium 138; TSH 1.210   Lipid Panel    Component Value Date/Time   CHOL 127 10/24/2014 0814   TRIG 113 10/24/2014 0814   HDL 42 10/24/2014 0814   CHOLHDL 3.0 10/24/2014 0814   VLDL 23 10/24/2014 0814   LDLCALC 62 10/24/2014 0814    Additional studies/ records that were reviewed today include:    Cath 11/07/2014 Conclusion    Dist LAD lesion, 30% stenosed.  There is mild left ventricular systolic dysfunction.   Insertion of temporary transvenous pacemaker secondary to underlying complete heart block.  No significant coronary obstructive disease with evidence for mild mid systolic bridging of the mid LAD with narrowing up to 50% during systole; normal left circumflex coronary artery; calcification of the ostium of the RCA without significant stenosis in a very large dominant RCA vessel.  Mild global LV dysfunction with an ejection fraction at approximately 45%.  There is  extensive mitral annular calcification  RECOMMENDATION: The patient will be transported to the CCU.  Plans will be for him to undergo permanent pacemaker insertion tomorrow.      Echo 01/07/2015 LV EF: 55% -   60%  Study Conclusions  - Left ventricle: The cavity size was normal. Systolic function was   normal. The estimated ejection fraction was in the range of 55%   to 60%. Wall motion was normal; there were no regional wall   motion abnormalities. Features are consistent with a pseudonormal   left ventricular filling pattern, with concomitant abnormal   relaxation and increased filling pressure (grade 2 diastolic   dysfunction). - Mitral valve: There was mild regurgitation. - Left atrium: The atrium was mildly dilated. - Right ventricle: The cavity size was normal. Wall thickness was   normal. Systolic function was mildly reduced. - Poor endocardial border definition limits wall motion assessment   and LVEF assessment. However, it appears to be grossly normal.    ASSESSMENT:    1. Weakness   2. Postural dizziness   3. Exertional shortness of breath   4. PAF (paroxysmal atrial fibrillation) ([place])   5. Pacemaker   6. Coronary artery disease involving native coronary artery of native heart without angina pectoris   7. Essential hypertension   8. Hyperlipidemia, unspecified hyperlipidemia type   9. NICM (nonischemic cardiomyopathy) ([place])   10. Hypothyroidism, unspecified type      PLAN:  In order of problems listed above:  1. Weakness: He does not appears to be volume overloaded, however he has a very unusual crackle in the right base of the lung.  I will obtain a chest x-Long.  There are a lot of potential causes for his weakness.  I plan to obtain lab work to rule out secondary causes.  My concern is worsening degree of cardiomyopathy in this case is the main contributor to his symptoms.  He is pending echocardiogram ordered by Dr. Claiborne Billings.  EKG today continue to show  paced rhythm.  There is no obvious sign of orthostatic hypotension based on vital signs.  Obtain complete metabolic panel CBC, TSH, lactic acid and BNP.  2. Complete heart block s/p pacemaker: EKG today continue to show paced rhythm  3. PAF: On Xarelto.  Obtain CBC given his weakness.  4. CAD: Last cardiac catheterization was in 2016 which showed nonobstructive disease  5. Hypertension: His blood pressure is borderline low today, he is not on any blood pressure medication.  I will obtain CBC to make sure his white blood cell count is not elevated.  6. Hyperlipidemia: On Lipitor 10 mg daily  7. Nonischemic cardiomyopathy: He has a history of nonischemic cardiomyopathy with improved EF, last echocardiogram in November 2016 showed a normalization of ejection fraction to 55-60%.  Given recent symptom, a repeat echocardiogram is ordered for this Friday.  8. Hypothyroidism    Medication Adjustments/Labs and Tests Ordered: Current medicines are reviewed at length with the patient today.  Concerns regarding medicines are outlined above.  Medication changes, Labs and Tests ordered today are listed in the Patient Instructions below. Patient Instructions  Medication Instructions: Your physician recommends that you continue on your current medications as directed. Please refer to the Current Medication list given to you today.  If you need a refill on your cardiac medications before your next appointment, please call your pharmacy.   Labwork: Your provider would like for you to have the following labs today: CBC, CMET, TSH, Lactic acid, BNP   Procedures/Testing: A chest x-Long  takes a picture of the organs and structures inside the chest, including the heart, lungs, and blood vessels. This test can show several things, including, whether the heart is enlarges; whether fluid is building up in the lungs; and whether pacemaker / defibrillator leads are still in place. This may be done at [place].   Follow-Up: Your physician wants you to keep your follow up with Dr. Claiborne Billings on 04/21/17   Thank you for choosing Heartcare at [facility]!!         Signed, Almyra Deforest, [place]  04/13/2017 1:23 PM    [address], [place], [place]  [address] Phone: [phone]; Fax: [phone]

## 2017-04-11 NOTE — Patient Instructions (Signed)
Medication Instructions: Your physician recommends that you continue on your current medications as directed. Please refer to the Current Medication list given to you today.  If you need a refill on your cardiac medications before your next appointment, please call your pharmacy.   Labwork: Your provider would like for you to have the following labs today: CBC, CMET, TSH, Lactic acid, BNP   Procedures/Testing: A chest x-ray takes a picture of the organs and structures inside the chest, including the heart, lungs, and blood vessels. This test can show several things, including, whether the heart is enlarges; whether fluid is building up in the lungs; and whether pacemaker / defibrillator leads are still in place. This may be done at [place].   Follow-Up: Your physician wants you to keep your follow up with Dr. Claiborne Billings on 04/21/17   Thank you for choosing Heartcare at [facility]!!

## 2017-04-12 ENCOUNTER — Other Ambulatory Visit: Payer: Self-pay | Admitting: Cardiovascular Disease

## 2017-04-12 LAB — COMPREHENSIVE METABOLIC PANEL
ALT: 14 IU/L (ref 0–44)
AST: 19 IU/L (ref 0–40)
Albumin/Globulin Ratio: 1.4 (ref 1.2–2.2)
Albumin: 3.7 g/dL (ref 3.5–4.7)
Alkaline Phosphatase: 78 IU/L (ref 39–117)
BUN/Creatinine Ratio: 16 (ref 10–24)
BUN: 15 mg/dL (ref 8–27)
Bilirubin Total: 0.6 mg/dL (ref 0.0–1.2)
CO2: 24 mmol/L (ref 20–29)
Calcium: 9 mg/dL (ref 8.6–10.2)
Chloride: 101 mmol/L (ref 96–106)
Creatinine, Ser: 0.92 mg/dL (ref 0.76–1.27)
GFR calc Af Amer: 89 mL/min/{1.73_m2} (ref >59)
GFR calc non Af Amer: 77 mL/min/{1.73_m2} (ref >59)
Globulin, Total: 2.7 g/dL (ref 1.5–4.5)
Glucose: 115 mg/dL — ABNORMAL HIGH (ref 65–99)
Potassium: 4.2 mmol/L (ref 3.5–5.2)
Sodium: 138 mmol/L (ref 134–144)
Total Protein: 6.4 g/dL (ref 6.0–8.5)

## 2017-04-12 LAB — CBC
Hematocrit: 38.5 % (ref 37.5–51.0)
Hemoglobin: 13.1 g/dL (ref 13.0–17.7)
MCH: 31.8 pg (ref 26.6–33.0)
MCHC: 34 g/dL (ref 31.5–35.7)
MCV: 93 fL (ref 79–97)
Platelets: 310 10*3/uL (ref 150–379)
RBC: 4.12 x10E6/uL — ABNORMAL LOW (ref 4.14–5.80)
RDW: 13.4 % (ref 12.3–15.4)
WBC: 7.6 10*3/uL (ref 3.4–10.8)

## 2017-04-12 LAB — BRAIN NATRIURETIC PEPTIDE: BNP: 45.1 pg/mL (ref 0.0–100.0)

## 2017-04-12 LAB — TSH: TSH: 1.21 u[IU]/mL (ref 0.450–4.500)

## 2017-04-12 LAB — LACTIC ACID, PLASMA: Lactate, Ven: 14.9 mg/dL (ref 4.8–25.7)

## 2017-04-12 NOTE — Progress Notes (Signed)
No acute process in the lung

## 2017-04-12 NOTE — Progress Notes (Signed)
BNP normal, no sign of heart failure based on lab. White blood cell count within normal limit. No sign of anemia. Thyroid and electrolyte ok.

## 2017-04-13 ENCOUNTER — Encounter: Payer: Self-pay | Admitting: Physician Assistant

## 2017-04-13 MED ORDER — RIVAROXABAN 20 MG PO TABS: 20.0000 mg | ORAL_TABLET | Freq: Every day | ORAL | 1 refills | Status: DC

## 2017-04-13 NOTE — Addendum Note (Signed)
Addended by: Harrington Challenger on: 04/13/2017 07:14 AM   Modules accepted: Orders

## 2017-04-15 ENCOUNTER — Other Ambulatory Visit: Payer: Self-pay

## 2017-04-15 ENCOUNTER — Ambulatory Visit (HOSPITAL_COMMUNITY): Payer: Medicare Other | Attending: Cardiology

## 2017-04-15 ENCOUNTER — Telehealth: Payer: Self-pay | Admitting: Physician Assistant

## 2017-04-15 DIAGNOSIS — I255 Ischemic cardiomyopathy: Secondary | ICD-10-CM | POA: Diagnosis not present

## 2017-04-15 DIAGNOSIS — I110 Hypertensive heart disease with heart failure: Secondary | ICD-10-CM | POA: Insufficient documentation

## 2017-04-15 DIAGNOSIS — I2510 Atherosclerotic heart disease of native coronary artery without angina pectoris: Secondary | ICD-10-CM | POA: Diagnosis not present

## 2017-04-15 DIAGNOSIS — I480 Paroxysmal atrial fibrillation: Secondary | ICD-10-CM | POA: Diagnosis not present

## 2017-04-15 DIAGNOSIS — E785 Hyperlipidemia, unspecified: Secondary | ICD-10-CM | POA: Diagnosis not present

## 2017-04-15 DIAGNOSIS — I5022 Chronic systolic (congestive) heart failure: Secondary | ICD-10-CM | POA: Insufficient documentation

## 2017-04-15 NOTE — Telephone Encounter (Signed)
Trying to contact the patient to followup on the echo result today, unable to reach the patient, his phone system did not allow me to leave a message. Will reattempt in 1 hour again.  Hilbert Corrigan PA Pager: [phone]

## 2017-04-15 NOTE — Telephone Encounter (Signed)
I was able to reach the patient's family and discussed recent lab, xray and echo results. Unclear cause for his DOE and weakness.

## 2017-04-19 ENCOUNTER — Ambulatory Visit (INDEPENDENT_AMBULATORY_CARE_PROVIDER_SITE_OTHER): Payer: Medicare Other

## 2017-04-19 DIAGNOSIS — I5042 Chronic combined systolic (congestive) and diastolic (congestive) heart failure: Secondary | ICD-10-CM

## 2017-04-19 DIAGNOSIS — Z950 Presence of cardiac pacemaker: Secondary | ICD-10-CM

## 2017-04-19 NOTE — Progress Notes (Signed)
EPIC Encounter for ICM Monitoring  Patient Name: Thomas Long is a 84 y.o. male Date: 04/19/2017 Primary Care Physican: Lajean Manes, MD Primary [place] Electrophysiologist: Allred Dry Weight:unknown Bi-V Pacing: >99%      Spoke with wife.  Heart Failure questions reviewed, pt reported he is feeling better after having an ER visit on 03/24/2017 for weakness episode, cough and low blood pressure. ER was unable to determine why he had the symptoms and he has an appointment with Dr Claiborne Billings this week.     Thoracic impedance normal.  No diuretic.  Recommendations: No changes.   Encouraged to call for fluid symptoms.  Follow-up plan: ICM clinic phone appointment on 05/20/2017.  Office appointment scheduled 04/21/2017 with Dr. Claiborne Billings.  Copy of ICM check sent to Dr. Rayann Heman and Dr Claiborne Billings.   3 month ICM trend: 04/19/2017    1 Year ICM trend:       Rosalene Billings, RN 04/19/2017 12:24 PM

## 2017-04-21 ENCOUNTER — Ambulatory Visit: Payer: Medicare Other | Admitting: Cardiovascular Disease

## 2017-04-21 VITALS — BP 100/62 | HR 84 | Ht 70.0 in | Wt 204.8 lb

## 2017-04-21 DIAGNOSIS — I2510 Atherosclerotic heart disease of native coronary artery without angina pectoris: Secondary | ICD-10-CM

## 2017-04-21 DIAGNOSIS — I10 Essential (primary) hypertension: Secondary | ICD-10-CM

## 2017-04-21 DIAGNOSIS — Z950 Presence of cardiac pacemaker: Secondary | ICD-10-CM

## 2017-04-21 DIAGNOSIS — R531 Weakness: Secondary | ICD-10-CM

## 2017-04-21 DIAGNOSIS — I480 Paroxysmal atrial fibrillation: Secondary | ICD-10-CM

## 2017-04-21 DIAGNOSIS — E785 Hyperlipidemia, unspecified: Secondary | ICD-10-CM | POA: Diagnosis not present

## 2017-04-21 NOTE — Progress Notes (Signed)
Patient ID: Thomas Long, male   DOB: [birth year], 84 y.o.   MRN: [id number]    HPI: Thomas Long is a 84 y.o. male who presents to the office today for a 12 month followup cardiology evaluation.  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  In 2011 cardiac catheterization revealed ejection fraction at 50% with mild coronary artery disease coronary calcification with segmental 20% narrowing in the proximal LAD and mid LAD with mild muscle bridging, calcification at the ostium of the right coronary artery with 20-30% narrowing in the proximal to mid RCA and 30% distal narrowing. He has a history of a TIA with possible seizure and has been on Aggrenox and Keppra and is currently followed by neurology. An echo Doppler study in April 2012 showed an ejection fraction of 50% with inferior hypokinesis, mild mitral annular calcification and mild TR, mild aortic sclerosis without stenosis. I had seen him on 05/30/2012 with complaints of increased fatigability as well as shortness of breath with less activity.  A two-year followup echo Doppler study on 07/06/2012 which showed mild LVH. Ejection fraction was 50-55% and again there was mild hypokinesis of the basal mid inferior myocardium and  grade 1 diastolic dysfunction. He had minimal pulmonary hypertension with PA pressure 32 mm, mitral annular calcification with trivial MR. His aortic valve was mildly sclerotic without stenosis.  When I saw on 08/01/2012 he had orthostatic symptoms and was hypotensive. At that time, I weaned slowly and ultimately discontinued his Bystolic. He felt improved off this therapy. He has been without exertional chest pain.  He does admit to some aching nonexertional chest pain, particularly when he lies down, which lasts seconds to minutes. He does note some shortness of breath particularly with activity.  He denies any recent seizure activity.  He has had some difficulty with early memory loss and has been  started on Namenda 10 mg twice a day by neurology.  He continues to take atorvastatin 10 mg for hyperlipidemia.  He is unaware of palpitations.   He underwent a follow-up echo Doppler study on 08/29/2014.  This showed normal LV cavity size, but his LV function has slightly declined and was now 40-45% without segmental wall motion abnormality.  There also was felt to be mild the reduced RV systolic function.  He has had issues with low blood pressure.  He  saw Karrie Doffing, PA in Dr. Stefani Dama office.  He has not had any seizure activity but continues to be on Keppra.  He does have some short-term memory issues, which seem to be exacerbated by being overtired.  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  He no longer does visitation at the hospital due to his inability to walk distances without developing  shortness of breath.  He denies any chest pain.  He denies any awareness of arrhythmia.  He denies any bleeding.    He was admitted to [facility] hospital on 11/07/2014 with symptomatic complete heart block.  Upon presenting to the emergency room, his heart rate was in the 30s.  He had experienced several episodes of recurrent chest pain.  He was brought semi-urgently to the cardiac catheterization laboratory in cardiac catheterization was performed by me without difficulty.  This revealed no significant obstructive CAD but there was mild mid systolic bridging in the mid LAD with narrowing up to 50% during systole.  He had a normal left circumflex coronary artery and there was  calcification of the ostium of the RCA without significant stenosis and a very large dominant RCA vessel.  He had mild global LV dysfunction with an ejection fraction at 45%.  There was extensive mitral annular calcification.  He underwent insertion of a temporary transvenous pacemaker for his underlying complete heart block.  The following day he underwent permanent pacemaker insertion by Dr.  Rayann Heman and had a [employer] biventricular pacemaker implantation without difficulty.  Unfortunately, on October 2 1 back to the EP lab due to right atrial lead dislodgment and a new right atrial lead was placed and it was repositioning of a previously implanted right ventricular lead.  He developed a right groin pseudoaneurysm and underwent repair by Dr. Sherren Mocha Early on 11/15/2014.  He saw Dr. Rayann Heman back in the office on 01/07/2015.  Interrogation of his device revealed paroxysmal atrial fibrillation.  As result, his Aggrenox was discontinued and he was started on Xarelto 20 mg daily.  A follow-up echo Doppler study now showed normalization of LV function with an ejection fraction of 55-60%.  There was grade 2 diastolic dysfunction.  There was mild mitral regurgitation and mild dilatation of his left atrium.  Since I last saw him in March 2018, he has been seen by Dr. Rayann Heman and was doing well from an EP standpoint without recurrent atrial fibrillation.  He was started on corlander 5 mg.  he has occasional low blood pressure.  He had normal pacemaker function.  He has continued to be on Xarelto for his atrial fibrillation and prior stroke.  He was evaluated in the emergency room on March 24, 2017 . He was recently seen by Almyra Deforest, PAC.  He had more pronounced weakness.  He denied chest pain.  He underwent a follow-up echo Doppler study on April 15, 2017 which revealed an EF of 50-55%.  There was grade 1 diastolic dysfunction.  Aortic root size was minimally increased.  The ascending aorta measured 40 mm.  There was mitral annular calcification with mild MR.  Abnormal septal function due to pacemaker induced dyssynergy.  He presents to follow-up evaluation.  Past Medical History:  Diagnosis Date  . CAD (coronary artery disease)    a. LHC 10/2014: nonobstructive disease, 30% LAD with bridging up to 50%.  . Chronic pain   . Complete heart block ([place]) 11/07/2014   a. s/p [facility] Quadra Allure MP RF  model [id number] (serial number [id number]) biventricular pacemaker 10/2014.  Marland Kitchen Dementia    "memory lapses q now and then" (11/08/2014)  . Depression   . Facial cellulitis 02/19/2009   "related to [facility] & having skin cancer zapped; took him off the [place]"  . Hyperlipidemia   . Hypertension   . Hypothyroidism   . NICM (nonischemic cardiomyopathy) ([place])    a. 10/2014: EF 45%. (Prev 40-45% by echo 08/2014).  . Orthostatic hypotension   . Rheumatoid arthritis ([place])    "hands mainly" (11/08/2014)  . Seizures ([place]) 2011   "vs stroke; never determined which it was; on sz RX since"  (11/08/2014)  . Sleep apnea    "gone since losing weight"  . Stroke [facility]) 20111   "vs seizure; never determined which it was; on sz RX since"  (11/08/2014)  . TIA (transient ischemic attack)     Past Surgical History:  Procedure Laterality Date  . APPENDECTOMY  ~ 1988  . CARDIAC CATHETERIZATION  04/01/09   which showed low normal EF at 50% with question of underlying borderline area of minimal  inferoapical hypercontractility. He had mild coronary obstructive disease with coronary calcification and segmental 20% narrowing in the LAD proximally, 20% in the mid LAD with muscle bridging. There was calcification at the ostium of the RCA, and 20 to 30%narrowing of the proximal to mid RCA with 30% distal n  . CARDIAC CATHETERIZATION N/A 11/07/2014   Procedure: Left Heart Cath and Coronary Angiography/ temp wire;  Surgeon: [name], MD;  Location: [facility];  Service: Cardiovascular;  Laterality: N/A;  . CATARACT EXTRACTION W/ INTRAOCULAR LENS  IMPLANT, BILATERAL Bilateral early 2000's  . CHOLECYSTECTOMY OPEN  1980's  . EP IMPLANTABLE DEVICE N/A 11/08/2014   a. STJ CRTP implanted by Dr Rayann Heman  . EP IMPLANTABLE DEVICE N/A 11/10/2014   RA lead revision Dr Rayann Heman  . EXPLORATORY LAPAROTOMY  1990's   "put intestines back in & added mesh"  . FALSE ANEURYSM REPAIR Right 11/15/2014   Procedure: REPAIR OF RIGHT FEMORAL FALSE  ANEURYSM ;  Surgeon: Rosetta Posner, MD;  Location: [facility];  Service: Vascular;  Laterality: Right;  . KNEE CARTILAGE SURGERY Left 1990's   "opened it up"  . TONSILLECTOMY      No Known Allergies  Current Outpatient Medications  Medication Sig Dispense Refill  . atorvastatin (LIPITOR) 10 MG tablet TAKE 1 TABLET DAILY 90 tablet 3  . fluticasone (FLONASE) 50 MCG/ACT nasal spray Place 1 spray into both nostrils daily.    . ivabradine (CORLANOR) 5 MG TABS tablet Take 1 tablet (5 mg total) by mouth 2 (two) times daily with a meal. 180 tablet 2  . latanoprost (XALATAN) 0.005 % ophthalmic solution Place 1 drop into both eyes at bedtime.   3  . levETIRAcetam (KEPPRA XR) 500 MG 24 hr tablet Take 1 tablet (500 mg total) by mouth daily. 90 tablet 3  . loratadine (CLARITIN) 10 MG tablet Take 10 mg by mouth daily.    . memantine (NAMENDA) 10 MG tablet Take 1 tablet (10 mg total) by mouth 2 (two) times daily. 180 tablet 1  . Multiple Vitamins-Minerals (CENTRUM SILVER ADULT 50+ PO) Take 1 capsule by mouth daily.    . rivaroxaban (XARELTO) 20 MG TABS tablet Take 1 tablet (20 mg total) by mouth daily with supper. 90 tablet 1  . SIMBRINZA 1-0.2 % SUSP PLACE 1 DROP INTO RIGHT EYE 3 TIMES A DAY  11   No current facility-administered medications for this visit.     Socially he is married has 5 children 6 grandchildren. He typically goes to bed approximately 8:30 at night and wakes up at approximately 4 AM in the morning. In the early morning he spends time in prayer and then typically attends 7 AM Mass. Since I last saw him he is retired from his [employer].  ROS General: Negative; No fevers, chills, or night sweats;  HEENT: Positive for visual changes in his right eye due to glaucoma; no change in hearing, sinus congestion, difficulty swallowing Pulmonary: Negative; No cough, wheezing, shortness of breath, hemoptysis Cardiovascular: See history of present illness GI: Negative; No nausea, vomiting,  diarrhea, or abdominal pain GU: Negative; No dysuria, hematuria, or difficulty voiding Musculoskeletal: Negative; no myalgias, joint pain, or weakness Hematologic/Oncology: Negative; no easy bruising, bleeding Endocrine: Negative; no heat/cold intolerance; no diabetes Neuro: No recent seizures. Skin: Negative; No rashes or skin lesions Psychiatric: Negative; No behavioral problems, depression Sleep: Negative; No snoring, daytime sleepiness, hypersomnolence, bruxism, restless legs, hypnogognic hallucinations, no cataplexy Other comprehensive 14 point system review is negative.  PE BP 100/62   Pulse 84   Ht '5\' 10"'  (1.778 m)   Wt 204 lb 12.8 oz (92.9 kg)   BMI 29.39 kg/m      Repeat blood pressure by me was 94/62 supine 86/60 standing  Wt Readings from Last 3 Encounters:  04/21/17 204 lb 12.8 oz (92.9 kg)  04/11/17 206 lb (93.4 kg)  03/24/17 190 lb (86.2 kg)   General: Alert, oriented, no distress.  He appears more frail and weak. Skin: normal turgor, no rashes, warm and dry HEENT: Normocephalic, atraumatic. Pupils equal round and reactive to light; sclera anicteric; extraocular muscles intact;  Nose without nasal septal hypertrophy Mouth/Parynx benign; Mallinpatti scale 2 Neck: No JVD, no carotid bruits; normal carotid upstroke Lungs: clear to ausculatation and percussion; no wheezing or rales Chest wall: without tenderness to palpitation Heart: PMI not displaced, RRR, s1 s2 normal, 1/6 systolic murmur, no diastolic murmur, no rubs, gallops, thrills, or heaves Abdomen: soft, nontender; no hepatosplenomehaly, BS+; abdominal aorta nontender and not dilated by palpation. Back: no CVA tenderness Pulses 2+ Musculoskeletal: full range of motion, normal strength, no joint deformities Extremities: no clubbing cyanosis or edema, Homan's sign negative  Neurologic: grossly nonfocal; Cranial nerves grossly wnl Psychologic: Normal mood and affect   ECG (independently read by me):  Atrial sensed ventricular paced rhythm.  PVC.  5 ventricle.  Pacemaker  March 2018ECG (independently read by me): Atrially sensed, ventricular paced rhythm at 94 bpm.  PR interval 198 ms.  September 2017 ECG (independently read by me): Atrial sensing and ventricular paced rhythm at 101 bpm.  January 2017 ECG (independently read by me): Paced rhythm at 93 bpm.  Possible underlying A. fib.  ECG (independently read by me): Sinus tachycardia at 10 7 bpm.  First degree AV block with a PR interval at 224 ms.  Mild RV conduction delay.  No ectopy  May 2016 ECG (independently read by me): Sinus rhythm at 98 bpm.  Occasional PVC.  No ST segment changes.  QTc interval 472 ms.  First-degree AV block with a PR interval at 230 ms.  March 2015 ECG (independently read by me): Sinus tachycardia 103 beats per minute with one PAC. First degree AV block. RV conduction delay.  Prior 11/02/2012 ECG: Normal sinus rhythm at 94beats per minute with a rare PAC. Borderline first-degree block; PR 210 ms. nonspecific T changes.  LABS: BMP Latest Ref Rng & Units 04/11/2017 03/24/2017 03/16/2017  Glucose 65 - 99 mg/dL 115(H) 121(H) 109(H)  BUN 8 - 27 mg/dL '15 15 23  ' Creatinine 0.76 - 1.27 mg/dL 0.92 1.20 1.15  BUN/Creat Ratio 10 - 24 16 - 20  Sodium 134 - 144 mmol/L 138 136 139  Potassium 3.5 - 5.2 mmol/L 4.2 4.1 4.7  Chloride 96 - 106 mmol/L 101 103 101  CO2 20 - 29 mmol/L 24 21(L) 24  Calcium 8.6 - 10.2 mg/dL 9.0 8.6(L) 8.9   Hepatic Function Latest Ref Rng & Units 04/11/2017 10/24/2014 04/25/2013  Total Protein 6.0 - 8.5 g/dL 6.4 6.1 6.8  Albumin 3.5 - 4.7 g/dL 3.7 3.7 4.1  AST 0 - 40 IU/L '19 18 20  ' ALT 0 - 44 IU/L '14 11 13  ' Alk Phosphatase 39 - 117 IU/L 78 49 51  Total Bilirubin 0.0 - 1.2 mg/dL 0.6 0.8 0.9   CBC Latest Ref Rng & Units 04/11/2017 03/24/2017 03/16/2017  WBC 3.4 - 10.8 x10E3/uL 7.6 6.3 8.0  Hemoglobin 13.0 - 17.7 g/dL 13.1 13.1 13.2  Hematocrit 37.5 -  51.0 % 38.5 39.3 38.0  Platelets 150 - 379 x10E3/uL  310 243 308   Lab Results  Component Value Date   MCV 93 04/11/2017   MCV 95.9 03/24/2017   MCV 93 03/16/2017   Lab Results  Component Value Date   TSH 1.210 04/11/2017  No results found for: HGBA1C  Lipid Panel     Component Value Date/Time   CHOL 127 10/24/2014 0814   TRIG 113 10/24/2014 0814   HDL 42 10/24/2014 0814   CHOLHDL 3.0 10/24/2014 0814   VLDL 23 10/24/2014 0814   LDLCALC 62 10/24/2014 0814   RADIOLOGY: No results found.  IMPRESSION:  1. CAD in native artery   2. PAF (paroxysmal atrial fibrillation) ([place])   3. Essential hypertension   4. Pacemaker   5. Weakness   6. Hyperlipidemia, unspecified hyperlipidemia type     ASSESSMENT AND PLAN: Thomas Long is an 84 year old white male has a history of documented mild CAD and previously was on beta blocker therapy.  Beta blocker had been discontinued due to orthostatic hypotension and marked fatigability.  In 2016, he developed symptomatic complete heart block with heart rates in the 30s in the setting also of the mild recurrent chest pain.  Cardiac catheterization did not demonstrate significant obstructive disease and his LV function was reduced initially at 45%.  He underwent insertion of a biventricular pacemaker and several days later required lead revision due to atrial lead dislodgment.  His hospital course was complicated by the development of a pseudoaneurysm and he underwent successful repair by Dr. Donnetta Hutching.  He has been found to have paroxysmal atrial fibrillation on subsequent monitoring and continues to be on full dose anticoagulation with Xarelto.  He does not appears had recent AF.  I reviewed his most recent echo Doppler study which showed an EF of 94-50% and diastolic dysfunction is slightly improved and now grade 1.  He was started on ivabradine 5 mg for improved rate control by Dr. Rayann Heman.  He continues to be on atorvastatin for mild hyperlipidemia.  He is on Namenda per neurology and also continues to be on  Keppra without seizure activity.  There is no bleeding on Xarelto.  I will see him in 1 year for evaluation.   [name], MD, [facility]  04/22/2017 6:47 PM

## 2017-04-21 NOTE — Patient Instructions (Signed)

## 2017-04-22 ENCOUNTER — Encounter: Payer: Self-pay | Admitting: Cardiovascular Disease

## 2017-05-20 ENCOUNTER — Ambulatory Visit (INDEPENDENT_AMBULATORY_CARE_PROVIDER_SITE_OTHER): Payer: Medicare Other

## 2017-05-20 ENCOUNTER — Telehealth: Payer: Self-pay

## 2017-05-20 DIAGNOSIS — Z950 Presence of cardiac pacemaker: Secondary | ICD-10-CM | POA: Diagnosis not present

## 2017-05-20 DIAGNOSIS — I5042 Chronic combined systolic (congestive) and diastolic (congestive) heart failure: Secondary | ICD-10-CM | POA: Diagnosis not present

## 2017-05-20 NOTE — Telephone Encounter (Signed)
Spoke with wife.  Requested patient send ICM remote transmission today.  She verbalized understanding.

## 2017-05-20 NOTE — Progress Notes (Signed)
EPIC Encounter for ICM Monitoring  Patient Name: Thomas Long is a 84 y.o. male Date: 05/20/2017 Primary Care Physican: Lajean Manes, MD Primary [place] Electrophysiologist: Allred Dry Weight:unknown Bi-V Pacing: >99%  Since 03/16/17 AT/AF Burden: 6.0% (was 2.1% on previous transmission)       Spoke with wife.  Heart Failure questions reviewed, pt asymptomatic.   Thoracic impedance normal but was abnormal suggesting fluid accumulation from 04/23/2017 to 05/05/2017.  Prescribed dosage: No diuretic.  Recommendations: No changes.  Encouraged to call for fluid symptoms.  Follow-up plan: ICM clinic phone appointment on 06/20/2017.   Copy of ICM check sent to Dr. Rayann Heman.   3 month ICM trend: 05/20/2017    AT/AF    1 Year ICM trend:       Rosalene Billings, RN 05/20/2017 10:26 AM

## 2017-06-14 ENCOUNTER — Telehealth: Payer: Self-pay | Admitting: *Deleted

## 2017-06-14 NOTE — Telephone Encounter (Signed)
Alert reviewed for persistent AF since 05/25/17. Presenting rhythm: AF/Bp. 99%Bp. No ventricular high rate episodes. Appropriate histogram. Stable thoracic impedance. Normal device function.  Called patient to see if he noticed any new or worsening sx's since 4/17. Patient denies any sx's. He and his wife state that he's been doing well. I encouraged patient to continue taking the xarelto as Rx'd and to call for any changes in sx's. Patient/wife verbalized understanding.

## 2017-06-20 ENCOUNTER — Telehealth: Payer: Self-pay | Admitting: Cardiology

## 2017-06-20 ENCOUNTER — Ambulatory Visit (INDEPENDENT_AMBULATORY_CARE_PROVIDER_SITE_OTHER): Payer: Medicare Other | Admitting: *Deleted

## 2017-06-20 DIAGNOSIS — I5042 Chronic combined systolic (congestive) and diastolic (congestive) heart failure: Secondary | ICD-10-CM | POA: Diagnosis not present

## 2017-06-20 DIAGNOSIS — I442 Atrioventricular block, complete: Secondary | ICD-10-CM

## 2017-06-20 DIAGNOSIS — Z950 Presence of cardiac pacemaker: Secondary | ICD-10-CM | POA: Diagnosis not present

## 2017-06-20 NOTE — Telephone Encounter (Signed)
Spoke with pt and reminded pt of remote transmission that is due today. Pt verbalized understanding.   

## 2017-06-21 NOTE — Progress Notes (Signed)
EPIC Encounter for ICM Monitoring  Patient Name: Thomas Long is a 84 y.o. male Date: 06/21/2017 Primary Care Physican: Lajean Manes, MD Primary [place] Electrophysiologist: Allred Dry Weight:unknown Bi-V Pacing: >99%  AT/AF Burden 32%       Spoke with wife. Heart Failure questions reviewed, pt asymptomatic.  Planning a trip to [place] in August.   Thoracic impedance normal.  No diuretic.  Recommendations: No changes.  Encouraged to call for fluid symptoms.  Follow-up plan: ICM clinic phone appointment on 07/21/2017.    Copy of ICM check sent to Dr. Rayann Heman.   3 month ICM trend: 06/20/2017    AT/AF      1 Year ICM trend:       Rosalene Billings, RN 06/21/2017 2:08 PM

## 2017-06-21 NOTE — Progress Notes (Signed)
Remote pacemaker transmission.   

## 2017-06-22 ENCOUNTER — Encounter: Payer: Self-pay | Admitting: Cardiology

## 2017-06-23 LAB — CUP PACEART REMOTE DEVICE CHECK
Battery Remaining Longevity: 85 mo
Battery Remaining Percentage: 95.5 %
Battery Voltage: 2.96 V
Brady Statistic AP VP Percent: 2.6 %
Brady Statistic AP VS Percent: 1 %
Brady Statistic AS VP Percent: 96 %
Brady Statistic AS VS Percent: 1 %
Brady Statistic RA Percent Paced: 1.4 %
Date Time Interrogation Session: 20190513190856
Implantable Lead Implant Date: 20160930
Implantable Lead Implant Date: 20160930
Implantable Lead Implant Date: 20160930
Implantable Lead Location: 753858
Implantable Lead Location: 753859
Implantable Lead Location: 753860
Implantable Pulse Generator Implant Date: 20160930
Lead Channel Impedance Value: 360 Ohm
Lead Channel Impedance Value: 450 Ohm
Lead Channel Impedance Value: 710 Ohm
Lead Channel Pacing Threshold Amplitude: 0.5 V
Lead Channel Pacing Threshold Amplitude: 0.75 V
Lead Channel Pacing Threshold Amplitude: 1.375 V
Lead Channel Pacing Threshold Pulse Width: 0.5 ms
Lead Channel Pacing Threshold Pulse Width: 0.5 ms
Lead Channel Pacing Threshold Pulse Width: 0.8 ms
Lead Channel Sensing Intrinsic Amplitude: 4 mV
Lead Channel Sensing Intrinsic Amplitude: 7.6 mV
Lead Channel Setting Pacing Amplitude: 2 V
Lead Channel Setting Pacing Amplitude: 2 V
Lead Channel Setting Pacing Amplitude: 2.375
Lead Channel Setting Pacing Pulse Width: 0.5 ms
Lead Channel Setting Pacing Pulse Width: 0.8 ms
Lead Channel Setting Sensing Sensitivity: 5 mV
Pulse Gen Model: 3262
Pulse Gen Serial Number: 7798436

## 2017-07-06 NOTE — H&P (View-Only) (Signed)
Electrophysiology Office Note Date: 07/11/2017  ID:  Thomas Long, DOB 10/20/1931, MRN [id number]  PCP: Lajean Manes, MD Primary Cardiologist: Claiborne Billings Electrophysiologist: Allred  CC: Pacemaker follow-up  Thomas Long is a 84 y.o. male seen today for Dr Rayann Heman.  He presents today for routine electrophysiology followup.  Recent ICM remote demonstrated newly persistent AF and he was asked to come in today for evaluation. Since last being seen in our clinic, the patient reports doing reasonably well.  His wife feels that memory is not as good as it has been in recent weeks.  He also has had some increased shortness of breath.  He is typically very compliant with meds, but did miss a dose of [place] the other week.    He denies chest pain, PND, orthopnea, nausea, vomiting, dizziness, syncope, edema, weight gain, or early satiety.  Device History: STJ CRTP implanted 2016 for complete heart block, CHF   Past Medical History:  Diagnosis Date  . CAD (coronary artery disease)    a. LHC 10/2014: nonobstructive disease, 30% LAD with bridging up to 50%.  . Chronic pain   . Complete heart block ([place]) 11/07/2014   a. s/p [facility] Quadra Allure MP RF model [id number] (serial number [id number]) biventricular pacemaker 10/2014.  Marland Kitchen Dementia    "memory lapses q now and then" (11/08/2014)  . Depression   . Facial cellulitis 02/19/2009   "related to [facility] & having skin cancer zapped; took him off the Embrel"  . Hyperlipidemia   . Hypertension   . Hypothyroidism   . NICM (nonischemic cardiomyopathy) ([place])    a. 10/2014: EF 45%. (Prev 40-45% by echo 08/2014).  . Orthostatic hypotension   . Rheumatoid arthritis ([place])    "hands mainly" (11/08/2014)  . Seizures ([place]) 2011   "vs stroke; never determined which it was; on sz RX since"  (11/08/2014)  . Sleep apnea    "gone since losing weight"  . Stroke [facility]) 20111   "vs seizure; never determined which it was; on sz RX since"  (11/08/2014)  . TIA (transient  ischemic attack)    Past Surgical History:  Procedure Laterality Date  . APPENDECTOMY  ~ 1988  . CARDIAC CATHETERIZATION  04/01/09   which showed low normal EF at 50% with question of underlying borderline area of minimal inferoapical hypercontractility. He had mild coronary obstructive disease with coronary calcification and segmental 20% narrowing in the LAD proximally, 20% in the mid LAD with muscle bridging. There was calcification at the ostium of the RCA, and 20 to 30%narrowing of the proximal to mid RCA with 30% distal n  . CARDIAC CATHETERIZATION N/A 11/07/2014   Procedure: Left Heart Cath and Coronary Angiography/ temp wire;  Surgeon: [name], MD;  Location: [place] [facility];  Service: Cardiovascular;  Laterality: N/A;  . CATARACT EXTRACTION W/ INTRAOCULAR LENS  IMPLANT, BILATERAL Bilateral early 2000's  . CHOLECYSTECTOMY OPEN  1980's  . EP IMPLANTABLE DEVICE N/A 11/08/2014   a. STJ CRTP implanted by Dr Rayann Heman  . EP IMPLANTABLE DEVICE N/A 11/10/2014   RA lead revision Dr Rayann Heman  . EXPLORATORY LAPAROTOMY  1990's   "put intestines back in & added mesh"  . FALSE ANEURYSM REPAIR Right 11/15/2014   Procedure: REPAIR OF RIGHT FEMORAL FALSE ANEURYSM ;  Surgeon: Rosetta Posner, MD;  Location: [place];  Service: Vascular;  Laterality: Right;  . KNEE CARTILAGE SURGERY Left 1990's   "opened it up"  . TONSILLECTOMY  Current Outpatient Medications  Medication Sig Dispense Refill  . atorvastatin (LIPITOR) 10 MG tablet TAKE 1 TABLET DAILY 90 tablet 3  . fluticasone (FLONASE) 50 MCG/ACT nasal spray Place 1 spray into both nostrils daily.    . ivabradine (CORLANOR) 5 MG TABS tablet Take 1 tablet (5 mg total) by mouth 2 (two) times daily with a meal. (Patient not taking: Reported on 07/11/2017) 180 tablet 2  . latanoprost (XALATAN) 0.005 % ophthalmic solution Place 1 drop into both eyes at bedtime.   3  . levETIRAcetam (KEPPRA XR) 500 MG 24 hr tablet Take 1 tablet (500 mg total) by mouth  daily. 90 tablet 3  . loratadine (CLARITIN) 10 MG tablet Take 10 mg by mouth daily.    . memantine (NAMENDA) 10 MG tablet Take 1 tablet (10 mg total) by mouth 2 (two) times daily. 180 tablet 1  . Multiple Vitamins-Minerals (CENTRUM SILVER ADULT 50+ PO) Take 1 capsule by mouth daily.    . rivaroxaban (XARELTO) 20 MG TABS tablet Take 1 tablet (20 mg total) by mouth daily with supper. 90 tablet 1  . SIMBRINZA 1-0.2 % SUSP PLACE 1 DROP INTO RIGHT EYE 3 TIMES A DAY  11   No current facility-administered medications for this visit.     Allergies:   Patient has no known allergies.   Social History: Social History   Socioeconomic History  . Marital status: Married    Spouse name: [name]  . Number of children: 5  . Years of education: College  . Highest education level: Not on file  Occupational History  . Not on file  Social Needs  . Financial resource strain: Not on file  . Food insecurity:    Worry: Not on file    Inability: Not on file  . Transportation needs:    Medical: Not on file    Non-medical: Not on file  Tobacco Use  . Smoking status: Never Smoker  . Smokeless tobacco: Never Used  Substance and Sexual Activity  . Alcohol use: No    Alcohol/week: 0.0 oz  . Drug use: No  . Sexual activity: Not on file  Lifestyle  . Physical activity:    Days per week: Not on file    Minutes per session: Not on file  . Stress: Not on file  Relationships  . Social connections:    Talks on phone: Not on file    Gets together: Not on file    Attends religious service: Not on file    Active member of club or organization: Not on file    Attends meetings of clubs or organizations: Not on file    Relationship status: Not on file  . Intimate partner violence:    Fear of current or ex partner: Not on file    Emotionally abused: Not on file    Physically abused: Not on file    Forced sexual activity: Not on file  Other Topics Concern  . Not on file  Social History Narrative    Patient is married [name]) and lives at home with wife and two children.   Patient has five adult children.   Patient is retired.   Patient has a college education.   Patient is right-handed.   Patient drinks two cups of coffee daily, 1/2 can of soda daily and tea- 2-3 times per week.    Family History: Family History  Problem Relation Age of Onset  . Heart attack Father   . Cancer - Lung  Sister   . Thyroid disease Child      Review of Systems: All other systems reviewed and are otherwise negative except as noted above.   Physical Exam: VS:  BP 110/60   Pulse 70   Ht 5\' 10"  (1.778 m)   Wt 209 lb 6.4 oz (95 kg)   SpO2 92%   BMI 30.05 kg/m  , BMI Body mass index is 30.05 kg/m.  GEN- The patient is elderly and obese appearing, alert and oriented x 3 today.   HEENT: normocephalic, atraumatic; sclera clear, conjunctiva pink; hearing intact; oropharynx clear; neck supple  Lungs- Clear to ausculation bilaterally, normal work of breathing.  No wheezes, rales, rhonchi Heart- Regular rate and rhythm GI- soft, non-tender, non-distended, bowel sounds present  Extremities- no clubbing, cyanosis, or edema  MS- no significant deformity or atrophy Skin- warm and dry, no rash or lesion; PPM pocket well healed Psych- euthymic mood, full affect Neuro- strength and sensation are intact  PPM Interrogation- reviewed in detail today,  See PACEART report  Recent Labs: 04/11/2017: ALT 14; BNP 45.1; TSH 1.210 07/07/2017: BUN 18; Creatinine, Ser 1.24; Hemoglobin 12.1; Platelets 323; Potassium 4.4; Sodium 138   Wt Readings from Last 3 Encounters:  07/11/17 210 lb 9.6 oz (95.5 kg)  07/07/17 209 lb 6.4 oz (95 kg)  04/21/17 204 lb 12.8 oz (92.9 kg)     Other studies Reviewed: Additional studies/ records that were reviewed today include: Dr Jackalyn Lombard office notes  Assessment and Plan:  1.  Complete heart block Normal PPM function See Pace Art report No changes today  2.  Persistent  atrial fibrillation Previously paroxsymal Symptoms have worsened with persistent AF Continue Xarelto for CHADS2VASC of 5 - he reports no missed doses in last week. He did miss one dose the week before that DCCV planned for 2 weeks. Risks, benefits reviewed with patient and wife who wish to proceed.   3.  Hypotension Chronic  4.  Chronic systolic heart failure Euvolemic on exam Continue current therapy He was symptomatically improved on Corlanor.  Will stop for now.  Will need to reassess whether to restart once back in SR.    Current medicines are reviewed at length with the patient today.   The patient does not have concerns regarding his medicines.  The following changes were made today:  none  Labs/ tests ordered today include:  Orders Placed This Encounter  Procedures  . CBC  . Basic metabolic panel  . CUP PACEART INCLINIC DEVICE CHECK     Disposition:   Follow up with me 2 weeks after DCCV  Signed, Chanetta Marshall, NP 07/11/2017 6:57 PM  [place] [place] [address] [phone] (office) [phone] (fax)

## 2017-07-06 NOTE — Progress Notes (Signed)
Electrophysiology Office Note Date: 07/11/2017  ID:  Thomas Long, DOB 1931-07-29, MRN [id number]  PCP: Lajean Manes, MD Primary Cardiologist: Claiborne Billings Electrophysiologist: Allred  CC: Pacemaker follow-up  Thomas Long is a 84 y.o. male seen today for Dr Rayann Heman.  He presents today for routine electrophysiology followup.  Recent ICM remote demonstrated newly persistent AF and he was asked to come in today for evaluation. Since last being seen in our clinic, the patient reports doing reasonably well.  His wife feels that memory is not as good as it has been in recent weeks.  He also has had some increased shortness of breath.  He is typically very compliant with meds, but did miss a dose of [place] the other week.    He denies chest pain, PND, orthopnea, nausea, vomiting, dizziness, syncope, edema, weight gain, or early satiety.  Device History: STJ CRTP implanted 2016 for complete heart block, CHF   Past Medical History:  Diagnosis Date  . CAD (coronary artery disease)    a. LHC 10/2014: nonobstructive disease, 30% LAD with bridging up to 50%.  . Chronic pain   . Complete heart block ([place]) 11/07/2014   a. s/p [facility] Quadra Allure MP RF model [id number] (serial number [id number]) biventricular pacemaker 10/2014.  Marland Kitchen Dementia    "memory lapses q now and then" (11/08/2014)  . Depression   . Facial cellulitis 02/19/2009   "related to [facility] & having skin cancer zapped; took him off the Embrel"  . Hyperlipidemia   . Hypertension   . Hypothyroidism   . NICM (nonischemic cardiomyopathy) ([place])    a. 10/2014: EF 45%. (Prev 40-45% by echo 08/2014).  . Orthostatic hypotension   . Rheumatoid arthritis ([place])    "hands mainly" (11/08/2014)  . Seizures ([place]) 2011   "vs stroke; never determined which it was; on sz RX since"  (11/08/2014)  . Sleep apnea    "gone since losing weight"  . Stroke [facility]) 20111   "vs seizure; never determined which it was; on sz RX since"  (11/08/2014)  . TIA (transient  ischemic attack)    Past Surgical History:  Procedure Laterality Date  . APPENDECTOMY  ~ 1988  . CARDIAC CATHETERIZATION  04/01/09   which showed low normal EF at 50% with question of underlying borderline area of minimal inferoapical hypercontractility. He had mild coronary obstructive disease with coronary calcification and segmental 20% narrowing in the LAD proximally, 20% in the mid LAD with muscle bridging. There was calcification at the ostium of the RCA, and 20 to 30%narrowing of the proximal to mid RCA with 30% distal n  . CARDIAC CATHETERIZATION N/A 11/07/2014   Procedure: Left Heart Cath and Coronary Angiography/ temp wire;  Surgeon: [name], MD;  Location: [place] [facility];  Service: Cardiovascular;  Laterality: N/A;  . CATARACT EXTRACTION W/ INTRAOCULAR LENS  IMPLANT, BILATERAL Bilateral early 2000's  . CHOLECYSTECTOMY OPEN  1980's  . EP IMPLANTABLE DEVICE N/A 11/08/2014   a. STJ CRTP implanted by Dr Rayann Heman  . EP IMPLANTABLE DEVICE N/A 11/10/2014   RA lead revision Dr Rayann Heman  . EXPLORATORY LAPAROTOMY  1990's   "put intestines back in & added mesh"  . FALSE ANEURYSM REPAIR Right 11/15/2014   Procedure: REPAIR OF RIGHT FEMORAL FALSE ANEURYSM ;  Surgeon: Rosetta Posner, MD;  Location: [place];  Service: Vascular;  Laterality: Right;  . KNEE CARTILAGE SURGERY Left 1990's   "opened it up"  . TONSILLECTOMY  Current Outpatient Medications  Medication Sig Dispense Refill  . atorvastatin (LIPITOR) 10 MG tablet TAKE 1 TABLET DAILY 90 tablet 3  . fluticasone (FLONASE) 50 MCG/ACT nasal spray Place 1 spray into both nostrils daily.    . ivabradine (CORLANOR) 5 MG TABS tablet Take 1 tablet (5 mg total) by mouth 2 (two) times daily with a meal. (Patient not taking: Reported on 07/11/2017) 180 tablet 2  . latanoprost (XALATAN) 0.005 % ophthalmic solution Place 1 drop into both eyes at bedtime.   3  . levETIRAcetam (KEPPRA XR) 500 MG 24 hr tablet Take 1 tablet (500 mg total) by mouth  daily. 90 tablet 3  . loratadine (CLARITIN) 10 MG tablet Take 10 mg by mouth daily.    . memantine (NAMENDA) 10 MG tablet Take 1 tablet (10 mg total) by mouth 2 (two) times daily. 180 tablet 1  . Multiple Vitamins-Minerals (CENTRUM SILVER ADULT 50+ PO) Take 1 capsule by mouth daily.    . rivaroxaban (XARELTO) 20 MG TABS tablet Take 1 tablet (20 mg total) by mouth daily with supper. 90 tablet 1  . SIMBRINZA 1-0.2 % SUSP PLACE 1 DROP INTO RIGHT EYE 3 TIMES A DAY  11   No current facility-administered medications for this visit.     Allergies:   Patient has no known allergies.   Social History: Social History   Socioeconomic History  . Marital status: Married    Spouse name: [name]  . Number of children: 5  . Years of education: College  . Highest education level: Not on file  Occupational History  . Not on file  Social Needs  . Financial resource strain: Not on file  . Food insecurity:    Worry: Not on file    Inability: Not on file  . Transportation needs:    Medical: Not on file    Non-medical: Not on file  Tobacco Use  . Smoking status: Never Smoker  . Smokeless tobacco: Never Used  Substance and Sexual Activity  . Alcohol use: No    Alcohol/week: 0.0 oz  . Drug use: No  . Sexual activity: Not on file  Lifestyle  . Physical activity:    Days per week: Not on file    Minutes per session: Not on file  . Stress: Not on file  Relationships  . Social connections:    Talks on phone: Not on file    Gets together: Not on file    Attends religious service: Not on file    Active member of club or organization: Not on file    Attends meetings of clubs or organizations: Not on file    Relationship status: Not on file  . Intimate partner violence:    Fear of current or ex partner: Not on file    Emotionally abused: Not on file    Physically abused: Not on file    Forced sexual activity: Not on file  Other Topics Concern  . Not on file  Social History Narrative    Patient is married [name]) and lives at home with wife and two children.   Patient has five adult children.   Patient is retired.   Patient has a college education.   Patient is right-handed.   Patient drinks two cups of coffee daily, 1/2 can of soda daily and tea- 2-3 times per week.    Family History: Family History  Problem Relation Age of Onset  . Heart attack Father   . Cancer - Lung  Sister   . Thyroid disease Child      Review of Systems: All other systems reviewed and are otherwise negative except as noted above.   Physical Exam: VS:  BP 110/60   Pulse 70   Ht 5\' 10"  (1.778 m)   Wt 209 lb 6.4 oz (95 kg)   SpO2 92%   BMI 30.05 kg/m  , BMI Body mass index is 30.05 kg/m.  GEN- The patient is elderly and obese appearing, alert and oriented x 3 today.   HEENT: normocephalic, atraumatic; sclera clear, conjunctiva pink; hearing intact; oropharynx clear; neck supple  Lungs- Clear to ausculation bilaterally, normal work of breathing.  No wheezes, rales, rhonchi Heart- Regular rate and rhythm GI- soft, non-tender, non-distended, bowel sounds present  Extremities- no clubbing, cyanosis, or edema  MS- no significant deformity or atrophy Skin- warm and dry, no rash or lesion; PPM pocket well healed Psych- euthymic mood, full affect Neuro- strength and sensation are intact  PPM Interrogation- reviewed in detail today,  See PACEART report  Recent Labs: 04/11/2017: ALT 14; BNP 45.1; TSH 1.210 07/07/2017: BUN 18; Creatinine, Ser 1.24; Hemoglobin 12.1; Platelets 323; Potassium 4.4; Sodium 138   Wt Readings from Last 3 Encounters:  07/11/17 210 lb 9.6 oz (95.5 kg)  07/07/17 209 lb 6.4 oz (95 kg)  04/21/17 204 lb 12.8 oz (92.9 kg)     Other studies Reviewed: Additional studies/ records that were reviewed today include: Dr Jackalyn Lombard office notes  Assessment and Plan:  1.  Complete heart block Normal PPM function See Pace Art report No changes today  2.  Persistent  atrial fibrillation Previously paroxsymal Symptoms have worsened with persistent AF Continue Xarelto for CHADS2VASC of 5 - he reports no missed doses in last week. He did miss one dose the week before that DCCV planned for 2 weeks. Risks, benefits reviewed with patient and wife who wish to proceed.   3.  Hypotension Chronic  4.  Chronic systolic heart failure Euvolemic on exam Continue current therapy He was symptomatically improved on [place].  Will stop for now.  Will need to reassess whether to restart once back in SR.    Current medicines are reviewed at length with the patient today.   The patient does not have concerns regarding his medicines.  The following changes were made today:  none  Labs/ tests ordered today include:  Orders Placed This Encounter  Procedures  . CBC  . Basic metabolic panel  . CUP PACEART INCLINIC DEVICE CHECK     Disposition:   Follow up with me 2 weeks after DCCV  Signed, Chanetta Marshall, NP 07/11/2017 6:57 PM  [place] [place] [address] [phone] (office) [phone] (fax)

## 2017-07-07 ENCOUNTER — Encounter: Payer: Self-pay | Admitting: Nurse Practitioner

## 2017-07-07 ENCOUNTER — Ambulatory Visit: Payer: Medicare Other | Admitting: Nurse Practitioner

## 2017-07-07 ENCOUNTER — Encounter: Payer: Self-pay | Admitting: *Deleted

## 2017-07-07 VITALS — BP 110/60 | HR 70 | Ht 70.0 in | Wt 209.4 lb

## 2017-07-07 DIAGNOSIS — I5022 Chronic systolic (congestive) heart failure: Secondary | ICD-10-CM | POA: Diagnosis not present

## 2017-07-07 DIAGNOSIS — I442 Atrioventricular block, complete: Secondary | ICD-10-CM | POA: Diagnosis not present

## 2017-07-07 DIAGNOSIS — I481 Persistent atrial fibrillation: Secondary | ICD-10-CM | POA: Diagnosis not present

## 2017-07-07 DIAGNOSIS — I951 Orthostatic hypotension: Secondary | ICD-10-CM

## 2017-07-07 DIAGNOSIS — I4819 Other persistent atrial fibrillation: Secondary | ICD-10-CM

## 2017-07-07 LAB — CUP PACEART INCLINIC DEVICE CHECK
Date Time Interrogation Session: 20190530105907
Implantable Lead Implant Date: 20160930
Implantable Lead Implant Date: 20160930
Implantable Lead Implant Date: 20160930
Implantable Lead Location: 753858
Implantable Lead Location: 753859
Implantable Lead Location: 753860
Implantable Pulse Generator Implant Date: 20160930
Pulse Gen Model: 3262
Pulse Gen Serial Number: 7798436

## 2017-07-07 LAB — BASIC METABOLIC PANEL
BUN/Creatinine Ratio: 15 (ref 10–24)
BUN: 18 mg/dL (ref 8–27)
CO2: 23 mmol/L (ref 20–29)
Calcium: 8.4 mg/dL — ABNORMAL LOW (ref 8.6–10.2)
Chloride: 102 mmol/L (ref 96–106)
Creatinine, Ser: 1.24 mg/dL (ref 0.76–1.27)
GFR calc Af Amer: 61 mL/min/{1.73_m2} (ref >59)
GFR calc non Af Amer: 53 mL/min/{1.73_m2} — ABNORMAL LOW (ref >59)
Glucose: 108 mg/dL — ABNORMAL HIGH (ref 65–99)
Potassium: 4.4 mmol/L (ref 3.5–5.2)
Sodium: 138 mmol/L (ref 134–144)

## 2017-07-07 LAB — CBC
Hematocrit: 35.2 % — ABNORMAL LOW (ref 37.5–51.0)
Hemoglobin: 12.1 g/dL — ABNORMAL LOW (ref 13.0–17.7)
MCH: 31.7 pg (ref 26.6–33.0)
MCHC: 34.4 g/dL (ref 31.5–35.7)
MCV: 92 fL (ref 79–97)
Platelets: 323 10*3/uL (ref 150–450)
RBC: 3.82 x10E6/uL — ABNORMAL LOW (ref 4.14–5.80)
RDW: 13.2 % (ref 12.3–15.4)
WBC: 8.2 10*3/uL (ref 3.4–10.8)

## 2017-07-07 NOTE — Patient Instructions (Addendum)
Medication Instructions:   STOP TAKING COLANOR  UNTIL  NEXT OFFICE VISIT   If you need a refill on your cardiac medications before your next appointment, please call your pharmacy.  Labwork:  CBC AND BMET TODAY    Testing/Procedures:  SEE ATTACHED LETTER Your physician has recommended that you have a Cardioversion (DCCV). Electrical Cardioversion uses a jolt of electricity to your heart either through paddles or wired patches attached to your chest. This is a controlled, usually prescheduled, procedure. Defibrillation is done under light anesthesia in the hospital, and you usually go home the day of the procedure. This is done to get your heart back into a normal rhythm. You are not awake for the procedure. Please see the instruction sheet given to you today.    Follow-Up: WITH SEILER 2 WEEKS AFTER CARDIOVERSION 07-19-17    Any Other Special Instructions Will Be Listed Below (If Applicable).                                                                                                                                                  T

## 2017-07-11 ENCOUNTER — Ambulatory Visit: Payer: Medicare Other | Admitting: Adult Health

## 2017-07-11 ENCOUNTER — Encounter: Payer: Self-pay | Admitting: Adult Health

## 2017-07-11 VITALS — BP 133/73 | HR 69 | Ht 70.0 in | Wt 210.6 lb

## 2017-07-11 DIAGNOSIS — Z8673 Personal history of transient ischemic attack (TIA), and cerebral infarction without residual deficits: Secondary | ICD-10-CM | POA: Diagnosis not present

## 2017-07-11 DIAGNOSIS — R569 Unspecified convulsions: Secondary | ICD-10-CM | POA: Diagnosis not present

## 2017-07-11 DIAGNOSIS — R413 Other amnesia: Secondary | ICD-10-CM

## 2017-07-11 NOTE — Progress Notes (Signed)
PATIENT: Thomas Long DOB: 1931/04/09  REASON FOR VISIT: follow up HISTORY FROM: patient  HISTORY OF PRESENT ILLNESS: Today 07/11/17:  [name] is an 84 year old male with a history of seizures, stroke and memory disturbance.  He returns today for follow-up.  He remains on Keppra extended release 500 mg daily.  He reports that he tolerates this well.  He denies any seizure events.  His wife reports that his memory is up and down.  She feels that this is related to his blood pressure.  He remains on Namenda.  He lives at home with his wife.  He is able to complete all ADLs independently.  He no longer operates a motor vehicle  His wife  manages hismedications.  Reports that he is up and down most the night.  But he does take several naps throughout the day.  Denies any changes with his mood or behavior.  Reports poor appetite.  He remains on Xarelto for stroke prevention.  Blood pressure within normal range today.  He continues to follow with his cardiologist.  He reports that he has been having some issues with his blood pressure as well as pacemaker.  He is following with Dr. Rayann Heman.  He returns today for an evaluation.  HISTORY 07/06/16: [name] is an 84 year old male with a history of seizures, stroke and memory loss. He returns today for follow-up. He is currently taking Keppra extended release 500 mg daily. He denies any seizure events. He remains on Xarelto. He is tolerating this well. He is also on Lipitor for his cholesterol. Blood pressure is in normal range today. Patient feels that his memory has remained stable. He continues on Namenda. His wife states that if he is under a lot of stress or if his pacemaker indicates that he has fluid around the heart is seems that his memory might be worse during this time. He lives at home with his wife. He is able to complete all ADLs independently. His wife does help him with his medications. His wife does all the finances. He does not operate a  motor vehicle-he states this is due to his vision. Denies any trouble sleeping. Denies any changes with his mood or behavior. He returns today for an evaluation.   REVIEW OF SYSTEMS: Out of a complete 14 system review of symptoms, the patient complains only of the following symptoms, and all other reviewed systems are negative.  See HPI  ALLERGIES: No Known Allergies  HOME MEDICATIONS: Outpatient Medications Prior to Visit  Medication Sig Dispense Refill  . atorvastatin (LIPITOR) 10 MG tablet TAKE 1 TABLET DAILY 90 tablet 3  . fluticasone (FLONASE) 50 MCG/ACT nasal spray Place 1 spray into both nostrils daily.    . ivabradine (CORLANOR) 5 MG TABS tablet Take 1 tablet (5 mg total) by mouth 2 (two) times daily with a meal. 180 tablet 2  . latanoprost (XALATAN) 0.005 % ophthalmic solution Place 1 drop into both eyes at bedtime.   3  . levETIRAcetam (KEPPRA XR) 500 MG 24 hr tablet Take 1 tablet (500 mg total) by mouth daily. 90 tablet 3  . loratadine (CLARITIN) 10 MG tablet Take 10 mg by mouth daily.    . memantine (NAMENDA) 10 MG tablet Take 1 tablet (10 mg total) by mouth 2 (two) times daily. 180 tablet 1  . Multiple Vitamins-Minerals (CENTRUM SILVER ADULT 50+ PO) Take 1 capsule by mouth daily.    . rivaroxaban (XARELTO) 20 MG TABS tablet Take 1 tablet (  20 mg total) by mouth daily with supper. 90 tablet 1  . SIMBRINZA 1-0.2 % SUSP PLACE 1 DROP INTO RIGHT EYE 3 TIMES A DAY  11   No facility-administered medications prior to visit.     PAST MEDICAL HISTORY: Past Medical History:  Diagnosis Date  . CAD (coronary artery disease)    a. LHC 10/2014: nonobstructive disease, 30% LAD with bridging up to 50%.  . Chronic pain   . Complete heart block ([place]) 11/07/2014   a. s/p [facility] Quadra Allure MP RF model [phone] (serial number [id number]) biventricular pacemaker 10/2014.  Marland Kitchen Dementia    "memory lapses q now and then" (11/08/2014)  . Depression   . Facial cellulitis 02/19/2009   "related  to [facility] & having skin cancer zapped; took him off the [place]"  . Hyperlipidemia   . Hypertension   . Hypothyroidism   . NICM (nonischemic cardiomyopathy) ([place])    a. 10/2014: EF 45%. (Prev 40-45% by echo 08/2014).  . Orthostatic hypotension   . Rheumatoid arthritis ([place])    "hands mainly" (11/08/2014)  . Seizures ([place]) 2011   "vs stroke; never determined which it was; on sz RX since"  (11/08/2014)  . Sleep apnea    "gone since losing weight"  . Stroke [facility]) 20111   "vs seizure; never determined which it was; on sz RX since"  (11/08/2014)  . TIA (transient ischemic attack)     PAST SURGICAL HISTORY: Past Surgical History:  Procedure Laterality Date  . APPENDECTOMY  ~ 1988  . CARDIAC CATHETERIZATION  04/01/09   which showed low normal EF at 50% with question of underlying borderline area of minimal inferoapical hypercontractility. He had mild coronary obstructive disease with coronary calcification and segmental 20% narrowing in the LAD proximally, 20% in the mid LAD with muscle bridging. There was calcification at the ostium of the RCA, and 20 to 30%narrowing of the proximal to mid RCA with 30% distal n  . CARDIAC CATHETERIZATION N/A 11/07/2014   Procedure: Left Heart Cath and Coronary Angiography/ temp wire;  Surgeon: [name], MD;  Location: [facility];  Service: Cardiovascular;  Laterality: N/A;  . CATARACT EXTRACTION W/ INTRAOCULAR LENS  IMPLANT, BILATERAL Bilateral early 2000's  . CHOLECYSTECTOMY OPEN  1980's  . EP IMPLANTABLE DEVICE N/A 11/08/2014   a. STJ CRTP implanted by Dr Rayann Heman  . EP IMPLANTABLE DEVICE N/A 11/10/2014   RA lead revision Dr Rayann Heman  . EXPLORATORY LAPAROTOMY  1990's   "put intestines back in & added mesh"  . FALSE ANEURYSM REPAIR Right 11/15/2014   Procedure: REPAIR OF RIGHT FEMORAL FALSE ANEURYSM ;  Surgeon: Rosetta Posner, MD;  Location: [facility];  Service: Vascular;  Laterality: Right;  . KNEE CARTILAGE SURGERY Left 1990's   "opened it up"  .  TONSILLECTOMY      FAMILY HISTORY: Family History  Problem Relation Age of Onset  . Heart attack Father   . Cancer - Lung Sister   . Thyroid disease Child     SOCIAL HISTORY: Social History   Socioeconomic History  . Marital status: Married    Spouse name: [name]  . Number of children: 5  . Years of education: College  . Highest education level: Not on file  Occupational History  . Not on file  Social Needs  . Financial resource strain: Not on file  . Food insecurity:    Worry: Not on file    Inability: Not on file  . Transportation  needs:    Medical: Not on file    Non-medical: Not on file  Tobacco Use  . Smoking status: Never Smoker  . Smokeless tobacco: Never Used  Substance and Sexual Activity  . Alcohol use: No    Alcohol/week: 0.0 oz  . Drug use: No  . Sexual activity: Not on file  Lifestyle  . Physical activity:    Days per week: Not on file    Minutes per session: Not on file  . Stress: Not on file  Relationships  . Social connections:    Talks on phone: Not on file    Gets together: Not on file    Attends religious service: Not on file    Active member of club or organization: Not on file    Attends meetings of clubs or organizations: Not on file    Relationship status: Not on file  . Intimate partner violence:    Fear of current or ex partner: Not on file    Emotionally abused: Not on file    Physically abused: Not on file    Forced sexual activity: Not on file  Other Topics Concern  . Not on file  Social History Narrative   Patient is married [name]) and lives at home with wife and two children.   Patient has five adult children.   Patient is retired.   Patient has a college education.   Patient is right-handed.   Patient drinks two cups of coffee daily, 1/2 can of soda daily and tea- 2-3 times per week.      PHYSICAL EXAM  Vitals:   07/11/17 0855  BP: 133/73  Pulse: 69  Weight: 210 lb 9.6 oz (95.5 kg)  Height: 5\' 10"  (1.778 m)     Body mass index is 30.05 kg/m.   MMSE - Mini Mental State Exam 07/06/2016 01/07/2016 07/08/2015  Orientation to time 4 3 4   Orientation to Place 5 5 5   Registration 3 3 3   Attention/ Calculation 4 5 5   Recall 0 2 2  Language- name 2 objects 2 2 2   Language- repeat 1 1 1   Language- follow 3 step command 2 3 3   Language- read & follow direction 1 1 1   Write a sentence 1 1 1   Copy design 0 1 1  Total score 23 27 28      Generalized: Well developed, in no acute distress   Neurological examination  Mentation: Alert oriented to time, place, history taking. Follows all commands speech and language fluent Cranial nerve II-XII: Pupils were equal round reactive to light. Extraocular movements were full, visual field were full on confrontational test. Facial sensation and strength were normal. Uvula tongue midline. Head turning and shoulder shrug  were normal and symmetric. Motor: The motor testing reveals 5 over 5 strength of all 4 extremities. Good symmetric motor tone is noted throughout.  Sensory: Sensory testing is intact to soft touch on all 4 extremities. No evidence of extinction is noted.  Coordination: Cerebellar testing reveals good finger-nose-finger and heel-to-shin bilaterally.  Gait and station: Gait is normal. Reflexes: Deep tendon reflexes are symmetric and normal bilaterally.   DIAGNOSTIC DATA (LABS, IMAGING, TESTING) - I reviewed patient records, labs, notes, testing and imaging myself where available.  Lab Results  Component Value Date   WBC 8.2 07/07/2017   HGB 12.1 (L) 07/07/2017   HCT 35.2 (L) 07/07/2017   MCV 92 07/07/2017   PLT 323 07/07/2017      Component Value Date/Time  NA 138 07/07/2017 1156   K 4.4 07/07/2017 1156   CL 102 07/07/2017 1156   CO2 23 07/07/2017 1156   GLUCOSE 108 (H) 07/07/2017 1156   GLUCOSE 121 (H) 03/24/2017 0939   BUN 18 07/07/2017 1156   CREATININE 1.24 07/07/2017 1156   CREATININE 1.18 (H) 12/25/2014 1605   CALCIUM 8.4 (L)  07/07/2017 1156   PROT 6.4 04/11/2017 1525   ALBUMIN 3.7 04/11/2017 1525   AST 19 04/11/2017 1525   ALT 14 04/11/2017 1525   ALKPHOS 78 04/11/2017 1525   BILITOT 0.6 04/11/2017 1525   GFRNONAA 53 (L) 07/07/2017 1156   GFRAA 61 07/07/2017 1156   Lab Results  Component Value Date   CHOL 127 10/24/2014   HDL 42 10/24/2014   LDLCALC 62 10/24/2014   TRIG 113 10/24/2014   CHOLHDL 3.0 10/24/2014   No results found for: HGBA1C No results found for: VITAMINB12 Lab Results  Component Value Date   TSH 1.210 04/11/2017      ASSESSMENT AND PLAN 84 y.o. year old male  has a past medical history of CAD (coronary artery disease), Chronic pain, Complete heart block ([place]) (11/07/2014), Dementia, Depression, Facial cellulitis (02/19/2009), Hyperlipidemia, Hypertension, Hypothyroidism, NICM (nonischemic cardiomyopathy) ([place]), Orthostatic hypotension, Rheumatoid arthritis ([place]), Seizures ([place]) (2011), Sleep apnea, Stroke ([place]) (20111), and TIA (transient ischemic attack). here with:  1.  Memory disturbance 2.  Seizures 3.  History of stroke  Overall the patient has remained stable.  His memory score has declined slightly.  He will remain on Namenda.  He denies any seizure events.  He will continue on Keppra extended release 500 mg daily.  He is advised that if his symptoms worsen or he develop new symptoms he should let us know.  He will follow-up in 6 months or sooner if needed.   Ward Givens, MSN, NP-C 07/11/2017, 8:57 AM [facility] [address], [place],  [address] [phone]

## 2017-07-11 NOTE — Patient Instructions (Signed)
Your Plan:  Continue Namenda- memory score is stable Continue Keppra XR 500 mg daily  If your symptoms worsen or you develop new symptoms please let us know.   Thank you for coming to see [place] at [facility]. I hope we have been able to provide you high quality care today.  You may receive a patient satisfaction survey over the next few weeks. We would appreciate your feedback and comments so that we may continue to improve ourselves and the health of our patients.

## 2017-07-14 NOTE — Progress Notes (Signed)
I agree with the memory assessment and plan as directed by NP .The patient is known to me .   Dezaria Methot, MD

## 2017-07-15 ENCOUNTER — Other Ambulatory Visit: Payer: Self-pay | Admitting: Neurology

## 2017-07-18 ENCOUNTER — Other Ambulatory Visit: Payer: Self-pay | Admitting: Nurse Practitioner

## 2017-07-19 ENCOUNTER — Encounter (HOSPITAL_COMMUNITY): Payer: Self-pay

## 2017-07-19 ENCOUNTER — Other Ambulatory Visit: Payer: Self-pay

## 2017-07-19 ENCOUNTER — Ambulatory Visit (HOSPITAL_COMMUNITY): Payer: Medicare Other | Admitting: Anesthesiology

## 2017-07-19 ENCOUNTER — Ambulatory Visit (HOSPITAL_COMMUNITY)
Admission: RE | Admit: 2017-07-19 | Discharge: 2017-07-19 | Disposition: A | Discharge status: Home / Self Care | Payer: Medicare Other | Source: Ambulatory Visit | Attending: Cardiovascular Disease | Admitting: Cardiovascular Disease

## 2017-07-19 ENCOUNTER — Encounter (HOSPITAL_COMMUNITY)
Admission: RE | Disposition: A | Discharge status: Home / Self Care | Payer: Self-pay | Source: Ambulatory Visit | Attending: Cardiovascular Disease

## 2017-07-19 DIAGNOSIS — Z7951 Long term (current) use of inhaled steroids: Secondary | ICD-10-CM | POA: Diagnosis not present

## 2017-07-19 DIAGNOSIS — F0390 Unspecified dementia without behavioral disturbance: Secondary | ICD-10-CM | POA: Insufficient documentation

## 2017-07-19 DIAGNOSIS — I2510 Atherosclerotic heart disease of native coronary artery without angina pectoris: Secondary | ICD-10-CM | POA: Insufficient documentation

## 2017-07-19 DIAGNOSIS — I481 Persistent atrial fibrillation: Secondary | ICD-10-CM

## 2017-07-19 DIAGNOSIS — I428 Other cardiomyopathies: Secondary | ICD-10-CM | POA: Insufficient documentation

## 2017-07-19 DIAGNOSIS — E039 Hypothyroidism, unspecified: Secondary | ICD-10-CM | POA: Insufficient documentation

## 2017-07-19 DIAGNOSIS — Z8673 Personal history of transient ischemic attack (TIA), and cerebral infarction without residual deficits: Secondary | ICD-10-CM | POA: Diagnosis not present

## 2017-07-19 DIAGNOSIS — Z950 Presence of cardiac pacemaker: Secondary | ICD-10-CM | POA: Diagnosis not present

## 2017-07-19 DIAGNOSIS — Z7901 Long term (current) use of anticoagulants: Secondary | ICD-10-CM | POA: Insufficient documentation

## 2017-07-19 DIAGNOSIS — I442 Atrioventricular block, complete: Secondary | ICD-10-CM | POA: Diagnosis not present

## 2017-07-19 DIAGNOSIS — F329 Major depressive disorder, single episode, unspecified: Secondary | ICD-10-CM | POA: Diagnosis not present

## 2017-07-19 DIAGNOSIS — I5022 Chronic systolic (congestive) heart failure: Secondary | ICD-10-CM | POA: Insufficient documentation

## 2017-07-19 DIAGNOSIS — E785 Hyperlipidemia, unspecified: Secondary | ICD-10-CM | POA: Insufficient documentation

## 2017-07-19 DIAGNOSIS — I4819 Other persistent atrial fibrillation: Secondary | ICD-10-CM

## 2017-07-19 DIAGNOSIS — I951 Orthostatic hypotension: Secondary | ICD-10-CM | POA: Insufficient documentation

## 2017-07-19 DIAGNOSIS — I110 Hypertensive heart disease with heart failure: Secondary | ICD-10-CM | POA: Insufficient documentation

## 2017-07-19 DIAGNOSIS — M069 Rheumatoid arthritis, unspecified: Secondary | ICD-10-CM | POA: Insufficient documentation

## 2017-07-19 HISTORY — PX: CARDIOVERSION: SHX1299

## 2017-07-19 SURGERY — CARDIOVERSION
Anesthesia: General

## 2017-07-19 MED ORDER — PROPOFOL 10 MG/ML IV BOLUS
INTRAVENOUS | Status: DC | PRN
  Administered 2017-07-19: 50 mg via INTRAVENOUS

## 2017-07-19 MED ORDER — SODIUM CHLORIDE 0.9 % IV SOLN
INTRAVENOUS | Status: DC | PRN
  Administered 2017-07-19: 12:00:00 via INTRAVENOUS

## 2017-07-19 MED ORDER — LIDOCAINE HCL (CARDIAC) PF 100 MG/5ML IV SOSY
PREFILLED_SYRINGE | INTRAVENOUS | Status: DC | PRN
  Administered 2017-07-19: 60 mg via INTRAVENOUS

## 2017-07-19 NOTE — Anesthesia Preprocedure Evaluation (Addendum)
Anesthesia Evaluation  Patient identified by MRN, date of birth, ID band Patient awake    Reviewed: Allergy & Precautions, NPO status , Patient's Chart, lab work & pertinent test results  Airway Mallampati: I       Dental no notable dental hx. (+) Teeth Intact   Pulmonary    Pulmonary exam normal breath sounds clear to auscultation       Cardiovascular hypertension, Normal cardiovascular exam Rhythm:Regular Rate:Normal     Neuro/Psych    GI/Hepatic   Endo/Other    Renal/GU      Musculoskeletal   Abdominal Normal abdominal exam  (+)   Peds  Hematology  (+) Blood dyscrasia, anemia ,   Anesthesia Other Findings Thomas Long  Order# [id number]  Ordering physician: [name], MD Study date: 06/23/2017 Result Notes for CUP PACEART REMOTE DEVICE CHECK   Notes recorded by [name], MD on 06/23/2017 at 2:52 PM EDT Remote device check reviewed. Normal device function. Battery status, leads stable. Histograms reviewed and appropriate.  Routine follow-up   Indications   CHB (complete heart block) (HCC) (I44.2 (ICD-10-CM)) Conclusion   Remote reviewed. 33% AT/AF burden, persistent x 25days 21hrs + xarelto. 99% Bp. + ICM clinic. Next   Thomas Long  ECHO COMPLETE WO IMAGING ENHANCING AGENT  Order# [id number]  Reading physician: [name], MD Ordering physician: [name], MD Study date: 04/15/17 Result Notes for ECHOCARDIOGRAM COMPLETE   Notes recorded by [name], RN on 04/22/2017 at 10:08 AM EDT Reviewed at [facility] with Dr. Claiborne Billings 3/15 ------  Notes recorded by [name], RN on 04/19/2017 at 2:41 PM EDT Released to [place], MD [place] 3/14 ------  Notes recorded by [name], MD on 04/17/2017 at 3:45 PM EDT Mild LVH with low-normal EF of 50-55%, improved from previously. Mild grade 1 diastolic dysfunction. Septal dyssynergy probably due to  paced rhythm. Very mild aortic root dilatation   Study Result   Result status: Final result                          [name]                        [address], [place] [address]                            [phone]  ------------------------------------------------------------------- Transthoracic Echocardiography  Patient:    Thomas Long MR #:       [id number] Study Date: 04/15/2017 Gender:     M Age:        84 Height:     180.3 cm Weight:     93.4 kg BSA:        2.18 m^2 Pt. Status: Room:   ORDERING     [name], M.D.  REFERRING    [name], M.D.  SONOGRAPHER  [name], RDCS  ATTENDING    [name]  PERFORMING   [facility] Outpatient  cc:  ------------------------------------------------------------------- LV EF: 50% -   55%  ------------------------------------------------------------------- Indications:      (I50.22).  ------------------------------------------------------------------- History:   PMH:  Acquired from the patient and from the patient&'s chart.  Fatigue, weakness, orthostatic hypotension, and  cough. Paroxysmal atrial fibrillation, with 3degrees AV block, which has been managed with permanent pacemaker implantation.  Coronary artery disease.  Non-ischemic cardiomyopathy, with congestive heart failure, with an ejection fraction of 45%by echocardiography. The dysfunction is primarily systolic.  Transient ischemic attack. Risk factors:  Hypertension. Dyslipidemia.  ------------------------------------------------------------------- Study Conclusions  - Left ventricle: The cavity size was normal. Wall thickness was   increased in a pattern of mild LVH. Systolic function was normal.   The estimated ejection fraction was in the range of 50% to 55%.   Wall motion was normal; there were no regional wall motion   abnormalities. Doppler parameters are consistent with abnormal    left ventricular relaxation (grade 1 diastolic dysfunction). - Ventricular septum: Septal motion showed abnormal function and   dyssynergy. - Aortic root: The aortic root was mildly dilated. - Ascending aorta: The ascending aorta was mildly dilated. - Mitral valve: Calcified annulus. There was mild regurgitation.  Impressions:  - Low normal LV systolic function; mild diastolic dysfunction; mild   LVH; mildly dilated aortic root; mild MR.  ------------------------------------------------------------------- Labs, prior tests, procedures, and surgery: Echocardiography (November 2016).     EF was 60%.  ------------------------------------------------------------------- Study data:  Comparison was made to the study of November 2016. Study status:  Routine.  Procedure:  The patient reported no pain pre or post test. Transthoracic echocardiography for diagnosis, for left ventricular function evaluation, for right ventricular function evaluation, and for assessment of valvular function. Image quality was adequate.  Study completion:  There were no complications.          Transthoracic echocardiography.  M-mode, complete 2D, spectral Doppler, and color Doppler.  Birthdate: Patient birthdate: 06-03-1931.  Age:  Patient is 84 yr old.  Sex: Gender: male.    BMI: 28.7 kg/m^2.  Blood pressure:     96/66 Patient status:  Outpatient.  Study date:  Study date: 04/15/2017. Study time: 10:48 AM.  Location:  [facility]  -------------------------------------------------------------------  ------------------------------------------------------------------- Left ventricle:  The cavity size was normal. Wall thickness was increased in a pattern of mild LVH. Systolic function was normal. The estimated ejection fraction was in the range of 50% to 55%. Wall motion was normal; there were no regional wall motion abnormalities. Doppler parameters are consistent with abnormal left ventricular  relaxation (grade 1 diastolic dysfunction).  ------------------------------------------------------------------- Aortic valve:   Trileaflet; moderately calcified leaflets. Mobility was not restricted.  Doppler:  Transvalvular velocity was within the normal range. There was no stenosis. There was no regurgitation.  ------------------------------------------------------------------- Aorta:  Aortic root: The aortic root was mildly dilated. Ascending aorta: The ascending aorta was mildly dilated.  ------------------------------------------------------------------- Mitral valve:   Calcified annulus. Mobility was not restricted. Doppler:  Transvalvular velocity was within the normal range. There was no evidence for stenosis. There was mild regurgitation.  ------------------------------------------------------------------- Left atrium:  The atrium was normal in size.  ------------------------------------------------------------------- Right ventricle:  The cavity size was normal. Pacer wire or catheter noted in right ventricle. Systolic function was normal.   ------------------------------------------------------------------- Ventricular septum:   Septal motion showed abnormal function and dyssynergy.  ------------------------------------------------------------------- Pulmonic valve:    Doppler:  Transvalvular velocity was within the normal range. There was no evidence for stenosis. There was trivial regurgitation.  ------------------------------------------------------------------- Tricuspid valve:   Structurally normal valve.    Doppler: Transvalvular velocity was within the normal range. There was trivial regurgitation.  ------------------------------------------------------------------- Right atrium:  The atrium was normal in size. Pacer wire or catheter noted in right atrium.  -------------------------------------------------------------------  Pericardium:  There  was no pericardial effusion.  ------------------------------------------------------------------- Measurements   Left ventricle                         Value        Reference  LV ID, ED, PLAX chordal        (L)     41.7  mm     43 - 52  LV ID, ES, PLAX chordal        (L)     19.1  mm     23 - 38  LV fx shortening, PLAX chordal         54    %      >=29  LV PW thickness, ED                    11.9  mm     ---------  IVS/LV PW ratio, ED                    1            <=1.3  LV e&', lateral                         5.98  cm/s   ---------  LV E/e&', lateral                       9.65         ---------  LV e&', medial                          5.22  cm/s   ---------  LV E/e&', medial                        11.05        ---------  LV e&', average                         5.6   cm/s   ---------  LV E/e&', average                       10.3         ---------    Ventricular septum                     Value        Reference  IVS thickness, ED                      11.88 mm     ---------    LVOT                                   Value        Reference  LVOT peak velocity, S                  53.7  cm/s   ---------  LVOT mean velocity, S                  42    cm/s   ---------  LVOT VTI, S  8.77  cm     ---------    Aorta                                  Value        Reference  Aortic root ID, ED                     38    mm     ---------  Ascending aorta ID, A-P, S             40    mm     ---------    Left atrium                            Value        Reference  LA ID, A-P, ES                         29    mm     ---------  LA ID/bsa, A-P                         1.33  cm/m^2 <=2.2  LA volume, S                           33.8  ml     ---------  LA volume/bsa, S                       15.5  ml/m^2 ---------  LA volume, ES, 1-p A4C                 25.9  ml     ---------  LA volume/bsa, ES, 1-p A4C             11.9  ml/m^2 ---------  LA volume, ES, 1-p A2C                  42    ml     ---------  LA volume/bsa, ES, 1-p A2C             19.2  ml/m^2 ---------    Mitral valve                           Value        Reference  Mitral E-wave peak velocity            57.7  cm/s   ---------  Mitral A-wave peak velocity            128   cm/s   ---------  Mitral deceleration time       (H)     232   ms     150 - 230  Mitral E/A ratio, peak                 0.5          ---------    Tricuspid valve                        Value        Reference  Tricuspid regurg peak velocity         178  cm/s   ---------  Tricuspid peak RV-RA gradient          13    mm Hg  ---------    Right ventricle                        Value        Reference  TAPSE                                  15.3  mm     ---------  RV s&', lateral, S                      8.05  cm/s   ---------  Legend: (L)  and  (H)  mark values outside specified reference range.  ------------------------------------------------------------------- Prepared and Electronically Authenticated by  [name] 2019-03-08T16:29:18 MERGE Images   Show images for ECHOCARDIOGRAM COMPLETE Patient Information   Patient Name Thomas Long, Thomas Long Sex Male DOB 08-18-1931 SSN [id number] Reason for Exam  Priority: Routine  Dx: Chronic systolic CHF (congestive heart failure) ([place]) (I50.22 (ICD-10-CM)) Surgical History   Surgical History    Procedure Laterality Date Comment Source CARDIAC CATHETERIZATION  04/01/09 which showed low normal EF at 50% with question of underlying borderline area of minimal inferoapical hypercontractility. He had mild coronary obstructive disease with coronary calcification and segmental 20% narrowing in the LAD proximally, 20% in the mid LAD with muscle bridging. There was calcification at the ostium of the RCA, and 20 to 30%narrowing of the proximal to mid RCA with 30% distal n Provider CARDIAC CATHETERIZATION N/A 11/07/2014 Procedure: Left Heart Cath and Coronary Angiography/ temp wire;  Surgeon: [name], MD; Location: [facility]; Service: Cardiovascular; Laterality: N/A; Provider  Other Surgical History    Procedure Laterality Date Comment Source APPENDECTOMY  ~ 1988  Provider CATARACT EXTRACTION W/ INTRAOCULAR LENS IMPLANT, BILATERAL Bilateral early 2000's  Provider CHOLECYSTECTOMY OPEN  1980's  Provider EP IMPLANTABLE DEVICE N/A 11/08/2014 a. STJ CRTP implanted by Dr Rayann Heman Provider EP IMPLANTABLE DEVICE N/A 11/10/2014 RA lead revision Dr Rayann Heman Provider EXPLORATORY LAPAROTOMY  1990's "put intestines back in & added mesh" Provider FALSE ANEURYSM REPAIR Right 11/15/2014 Procedure: REPAIR OF RIGHT FEMORAL FALSE ANEURYSM ; Surgeon: Rosetta Posner, MD; Location: [facility]; Service: Vascular; Laterality: Right; Provider KNEE CARTILAGE SURGERY Left 1990's "opened it up" Provider TONSILLECTOMY    Provider  Performing Technologist/Nurse   Performing Technologist/Nurse: Gretta Began          Implants    Pacemaker Allure Crt [phone] - [id number] - Implanted     Inventory item: ALLURE CRT [id number] Model/Cat number: [id number] Serial number: [id number] Manufacturer: [place] [employer] CARDIAC SURGERY Device identifier: [id number] Device identifier type: GS1 As of 11/08/2014     Status: Implanted    Implant Sjcr Pm [place] [id number] - Implanted     Model/Cat number: PM [place] Serial number: [id number] Manufacturer: [place] M   As of 12/29/2015     Status: Implanted    Sjcr [id number] - Implanted     Model/Cat number: [id number] Serial number: [id number] Manufacturer: [place] M   As of 12/25/2014     Status: Implanted    Sjcr 2088tc [id number] - Implanted     Model/Cat number: 2088TC Serial number: [id number] Manufacturer: [place]  As of 12/25/2014     Status: Implanted    Sjcr 2088tc [id number] - Implanted     Model/Cat number: 2088TC Serial  number: [id number] Manufacturer: [place] [employer] CARDIAC RHYTHM M   As of 12/25/2014     Status: Implanted    Sjcr 1458q Quartet [id number] - Implanted     Model/Cat number: [id number] QUARTET Serial number: [id number] Manufacturer: [place] M   As of 07/21/2015     Status: Implanted    Sjcr 2088tc Tendril Sts [id number] - Implanted     Model/Cat number: 2088TC TENDRIL STS Serial number: [id number] Manufacturer: [place] M   As of 07/21/2015     Status: Implanted    Sjcr 2088tc Tendril Sts [id number] - Implanted     Model/Cat number: 2088TC TENDRIL STS Serial number: [id number] Manufacturer: [place] M   As of 07/21/2015     Status: Implanted    Sjcr [address] Mp Rf [id number] - Implanted     Model/Cat number: [id number] QASTMH DQQIWL MP RF Serial number: [id number] Manufacturer: [place] M   As of 07/07/2017     Status: Implanted    Order-Level Documents:   There are no order-level documents.  Encounter-Level Documents - 04/15/2017:   Electronic signature on 04/15/2017 10:05 AM - Signed  Electronic signature on 04/15/2017 10:04 AM - Signed    Signed   Electronically signed by [name], MD on 04/15/17 at 1629 EST Printable Result Report    Result Report  External Result Report    External Result Report     Reproductive/Obstetrics                            Anesthesia Physical Anesthesia Plan  ASA: II  Anesthesia Plan: General   Post-op Pain Management:    Induction: Intravenous  PONV Risk Score and Plan: 2 and Treatment may vary due to age or medical condition  Airway Management Planned: Mask and Natural Airway  Additional Equipment:   Intra-op Plan:   Post-operative Plan:   Informed Consent: I have reviewed the patients History and Physical, chart, labs and discussed the procedure including the risks, benefits and alternatives for the proposed  anesthesia with the patient or authorized representative who has indicated his/her understanding and acceptance.     Plan Discussed with: CRNA and Surgeon  Anesthesia Plan Comments:         Anesthesia Quick Evaluation

## 2017-07-19 NOTE — Anesthesia Postprocedure Evaluation (Signed)
Anesthesia Post Note  Patient: Thomas Long  Procedure(s) Performed: CARDIOVERSION (N/A )     Patient location during evaluation: Endoscopy Anesthesia Type: General Level of consciousness: awake and sedated Pain management: pain level controlled Vital Signs Assessment: post-procedure vital signs reviewed and stable Respiratory status: spontaneous breathing Cardiovascular status: stable Postop Assessment: adequate PO intake    Last Vitals:  Vitals:   07/19/17 1230 07/19/17 1236  BP: 139/87   Pulse: 90 89  Resp: (!) 24 20  Temp:    SpO2: 97% 97%    Last Pain:  Vitals:   07/19/17 1220  TempSrc:   PainSc: 0-No pain   Pain Goal:                 Jahlani Lorentz JR,JOHN Haddie Bruhl

## 2017-07-19 NOTE — Transfer of Care (Signed)
Immediate Anesthesia Transfer of Care Note  Patient: Tell Rozelle  Procedure(s) Performed: CARDIOVERSION (N/A )  Patient Location: Endoscopy Unit  Anesthesia Type:General  Level of Consciousness: awake, alert  and oriented  Airway & Oxygen Therapy: Patient Spontanous Breathing and Patient connected to nasal cannula oxygen  Post-op Assessment: Report given to RN, Post -op Vital signs reviewed and stable and Patient moving all extremities X 4  Post vital signs: Reviewed and stable  Last Vitals:  Vitals Value Taken Time  BP 140/79 07/19/2017 12:07 PM  Temp    Pulse 96 07/19/2017 12:08 PM  Resp 22 07/19/2017 12:08 PM  SpO2 94 % 07/19/2017 12:08 PM  Vitals shown include unvalidated device data.  Last Pain:  Vitals:   07/19/17 1207  TempSrc: Oral  PainSc: 0-No pain         Complications: No apparent anesthesia complications

## 2017-07-19 NOTE — Discharge Instructions (Signed)
Monitored Anesthesia Care Anesthesia is a term that refers to techniques, procedures, and medicines that help a person stay safe and comfortable during a medical procedure. Monitored anesthesia care, or sedation, is one type of anesthesia. Your anesthesia specialist may recommend sedation if you will be having a procedure that does not require you to be unconscious, such as:  Cataract surgery.  A dental procedure.  A biopsy.  A colonoscopy.  During the procedure, you may receive a medicine to help you relax (sedative). There are three levels of sedation:  Mild sedation. At this level, you may feel awake and relaxed. You will be able to follow directions.  Moderate sedation. At this level, you will be sleepy. You may not remember the procedure.  Deep sedation. At this level, you will be asleep. You will not remember the procedure.  The more medicine you are given, the deeper your level of sedation will be. Depending on how you respond to the procedure, the anesthesia specialist may change your level of sedation or the type of anesthesia to fit your needs. An anesthesia specialist will monitor you closely during the procedure. Let your health care provider know about:  Any allergies you have.  All medicines you are taking, including vitamins, herbs, eye drops, creams, and over-the-counter medicines.  Any use of steroids (by mouth or as a cream).  Any problems you or family members have had with sedatives and anesthetic medicines.  Any blood disorders you have.  Any surgeries you have had.  Any medical conditions you have, such as sleep apnea.  Whether you are pregnant or may be pregnant.  Any use of cigarettes, alcohol, or street drugs. What are the risks? Generally, this is a safe procedure. However, problems may occur, including:  Getting too much medicine (oversedation).  Nausea.  Allergic reaction to medicines.  Trouble breathing. If this happens, a breathing tube  may be used to help with breathing. It will be removed when you are awake and breathing on your own.  Heart trouble.  Lung trouble.  Before the procedure Staying hydrated Follow instructions from your health care provider about hydration, which may include:  Up to 2 hours before the procedure - you may continue to drink clear liquids, such as water, clear fruit juice, black coffee, and plain tea.  Eating and drinking restrictions Follow instructions from your health care provider about eating and drinking, which may include:  8 hours before the procedure - stop eating heavy meals or foods such as meat, fried foods, or fatty foods.  6 hours before the procedure - stop eating light meals or foods, such as toast or cereal.  6 hours before the procedure - stop drinking milk or drinks that contain milk.  2 hours before the procedure - stop drinking clear liquids.  Medicines Ask your health care provider about:  Changing or stopping your regular medicines. This is especially important if you are taking diabetes medicines or blood thinners.  Taking medicines such as aspirin and ibuprofen. These medicines can thin your blood. Do not take these medicines before your procedure if your health care provider instructs you not to.  Tests and exams  You will have a physical exam.  You may have blood tests done to show: ? How well your kidneys and liver are working. ? How well your blood can clot.  General instructions  Plan to have someone take you home from the hospital or clinic.  If you will be going home right after the  procedure, plan to have someone with you for 24 hours. ° °What happens during the procedure? °· Your blood pressure, heart rate, breathing, level of pain and overall condition will be monitored. °· An IV tube will be inserted into one of your veins. °· Your anesthesia specialist will give you medicines as needed to keep you comfortable during the procedure. This may  mean changing the level of sedation. °· The procedure will be performed. °After the procedure °· Your blood pressure, heart rate, breathing rate, and blood oxygen level will be monitored until the medicines you were given have worn off. °· Do not drive for 24 hours if you received a sedative. °· You may: °? Feel sleepy, clumsy, or nauseous. °? Feel forgetful about what happened after the procedure. °? Have a sore throat if you had a breathing tube during the procedure. °? Vomit. °This information is not intended to replace advice given to you by your health care provider. Make sure you discuss any questions you have with your health care provider. °Document Released: 10/21/2004 Document Revised: 07/04/2015 Document Reviewed: 05/18/2015 °Elsevier Interactive Patient Education © 2018 [employer] Inc[employer] °Electrical Cardioversion, Care After °This sheet gives you information about how to care for yourself after your procedure. Your health care provider may also give you more specific instructions. If you have problems or questions, contact your health care provider. °What can I expect after the procedure? °After the procedure, it is common to have: °· Some redness on the skin where the shocks were given. ° °Follow these instructions at home: °· Do not drive for 24 hours if you were given a medicine to help you relax (sedative). °· Take over-the-counter and prescription medicines only as told by your health care provider. °· Ask your health care provider how to check your pulse. Check it often. °· Rest for 48 hours after the procedure or as told by your health care provider. °· Avoid or limit your caffeine use as told by your health care provider. °Contact a health care provider if: °· You feel like your heart is beating too quickly or your pulse is not regular. °· You have a serious muscle cramp that does not go away. °Get help right away if: °· You have discomfort in your chest. °· You are dizzy or you feel faint. °· You  have trouble breathing or you are short of breath. °· Your speech is slurred. °· You have trouble moving an arm or leg on one side of your body. °· Your fingers or toes turn cold or blue. °This information is not intended to replace advice given to you by your health care provider. Make sure you discuss any questions you have with your health care provider. °Document Released: 11/15/2012 Document Revised: 08/29/2015 Document Reviewed: 08/01/2015 °Elsevier Interactive Patient Education © 2018 [employer]vier Inc. ° °

## 2017-07-19 NOTE — CV Procedure (Signed)
    Cardioversion Note  Thomas Long [id number] 30-Apr-1931  Procedure: DC Cardioversion Indications:  Atrial fib   Procedure Details Consent: Obtained Time Out: Verified patient identification, verified procedure, site/side was marked, verified correct patient position, special equipment/implants available, Radiology Safety Procedures followed,  medications/allergies/relevent history reviewed, required imaging and test results available.  Performed  The patient has been on adequate anticoagulation.  The patient received IV Lidocaine 60 followed by Propofol 50 mg IV  for sedation.  Synchronous cardioversion was performed at 120  joules.  The cardioversion was  Successful.     Complications: No apparent complications Patient did tolerate procedure well.   Thayer Headings, Brooke Bonito., MD, [facility] 07/19/2017, 12:07 PM

## 2017-07-19 NOTE — Interval H&P Note (Signed)
History and Physical Interval Note:  07/19/2017 11:47 AM  [name]  has presented today for surgery, with the diagnosis of AFIB  The various methods of treatment have been discussed with the patient and family. After consideration of risks, benefits and other options for treatment, the patient has consented to  Procedure(s): CARDIOVERSION (N/A) as a surgical intervention .  The patient's history has been reviewed, patient examined, no change in status, stable for surgery.  I have reviewed the patient's chart and labs.  Questions were answered to the patient's satisfaction.     Thomas Long

## 2017-07-21 ENCOUNTER — Ambulatory Visit (INDEPENDENT_AMBULATORY_CARE_PROVIDER_SITE_OTHER): Payer: Self-pay

## 2017-07-21 DIAGNOSIS — I5022 Chronic systolic (congestive) heart failure: Secondary | ICD-10-CM

## 2017-07-21 DIAGNOSIS — Z950 Presence of cardiac pacemaker: Secondary | ICD-10-CM

## 2017-07-21 NOTE — Progress Notes (Signed)
EPIC Encounter for ICM Monitoring  Patient Name: Thomas Long is a 84 y.o. male Date: 07/21/2017 Primary Care Physican: Lajean Manes, MD Primary [place] Electrophysiologist: Allred Dry Weight:unknown Bi-V Pacing: 97%  AT/AF Burden 90% through 07/20/2017      Heart Failure questions reviewed, pt asymptomatic.  He said cardioversion on 07/19/2017 went well.  He has started using a walker to help with balance.    Thoracic impedance normal.  No diuretic.  Recommendations: No changes.   Encouraged to call for fluid symptoms.  Follow-up plan: ICM clinic phone appointment on 09/19/2017.  Office appointment scheduled 08/12/2017 with Chanetta Marshall, NP for post cardioversion follow up.   Copy of ICM check sent to Dr. Rayann Heman.   3 month ICM trend: 07/20/2017    AT/AF Trend viewer through 07/20/2017   1 Year ICM trend:       Rosalene Billings, RN 07/21/2017 12:26 PM

## 2017-08-10 ENCOUNTER — Encounter: Payer: Medicare Other | Admitting: Nurse Practitioner

## 2017-08-11 NOTE — Progress Notes (Signed)
Electrophysiology Office Note Date: 08/12/2017  ID:  Thomas Long, DOB May 02, 1931, MRN [id number]  PCP: Lajean Manes, MD Primary Cardiologist: Claiborne Billings Electrophysiologist: Allred  CC: AF follow-up  Thomas Long is a 84 y.o. male seen today for Dr Rayann Heman.  He presents today for routine electrophysiology followup.  Since last being seen in our clinic, the patient reports doing reasonably well.  He has started walking with a cane which has helped his balance and confidence.  He denies chest pain, palpitations, dyspnea, PND, orthopnea, nausea, vomiting, dizziness, syncope, edema, weight gain, or early satiety.  Device History: STJ CRTP implanted 2016 for complete heart block, CHF   Past Medical History:  Diagnosis Date  . CAD (coronary artery disease)    a. LHC 10/2014: nonobstructive disease, 30% LAD with bridging up to 50%.  . Chronic pain   . Complete heart block ([place]) 11/07/2014   a. s/p [facility] Quadra Allure MP RF model [phone] (serial number [id number]) biventricular pacemaker 10/2014.  Marland Kitchen Dementia    "memory lapses q now and then" (11/08/2014)  . Depression   . Facial cellulitis 02/19/2009   "related to [facility] & having skin cancer zapped; took him off the Embrel"  . Hyperlipidemia   . Hypertension   . Hypothyroidism   . NICM (nonischemic cardiomyopathy) ([place])    a. 10/2014: EF 45%. (Prev 40-45% by echo 08/2014).  . Orthostatic hypotension   . Rheumatoid arthritis ([place])    "hands mainly" (11/08/2014)  . Seizures ([place]) 2011   "vs stroke; never determined which it was; on sz RX since"  (11/08/2014)  . Sleep apnea    "gone since losing weight"  . Stroke [facility] 20111   "vs seizure; never determined which it was; on sz RX since"  (11/08/2014)  . TIA (transient ischemic attack)    Past Surgical History:  Procedure Laterality Date  . APPENDECTOMY  ~ 1988  . CARDIAC CATHETERIZATION  04/01/09   which showed low normal EF at 50% with question of underlying borderline area of  minimal inferoapical hypercontractility. He had mild coronary obstructive disease with coronary calcification and segmental 20% narrowing in the LAD proximally, 20% in the mid LAD with muscle bridging. There was calcification at the ostium of the RCA, and 20 to 30%narrowing of the proximal to mid RCA with 30% distal n  . CARDIAC CATHETERIZATION N/A 11/07/2014   Procedure: Left Heart Cath and Coronary Angiography/ temp wire;  Surgeon: [name], MD;  Location: [place] [facility];  Service: Cardiovascular;  Laterality: N/A;  . CARDIOVERSION N/A 07/19/2017   Procedure: CARDIOVERSION;  Surgeon: Acie Fredrickson Wonda Cheng, MD;  Location: [place];  Service: Cardiovascular;  Laterality: N/A;  . CATARACT EXTRACTION W/ INTRAOCULAR LENS  IMPLANT, BILATERAL Bilateral early 2000's  . CHOLECYSTECTOMY OPEN  1980's  . EP IMPLANTABLE DEVICE N/A 11/08/2014   a. STJ CRTP implanted by Dr Rayann Heman  . EP IMPLANTABLE DEVICE N/A 11/10/2014   RA lead revision Dr Rayann Heman  . EXPLORATORY LAPAROTOMY  1990's   "put intestines back in & added mesh"  . FALSE ANEURYSM REPAIR Right 11/15/2014   Procedure: REPAIR OF RIGHT FEMORAL FALSE ANEURYSM ;  Surgeon: Rosetta Posner, MD;  Location: [place];  Service: Vascular;  Laterality: Right;  . KNEE CARTILAGE SURGERY Left 1990's   "opened it up"  . TONSILLECTOMY      Current Outpatient Medications  Medication Sig Dispense Refill  . atorvastatin (LIPITOR) 10 MG tablet TAKE 1 TABLET DAILY 90 tablet 3  .  ivabradine (CORLANOR) 5 MG TABS tablet Take 1 tablet (5 mg total) by mouth 2 (two) times daily with a meal. 180 tablet 2  . latanoprost (XALATAN) 0.005 % ophthalmic solution Place 1 drop into both eyes at bedtime.   3  . levETIRAcetam (KEPPRA XR) 500 MG 24 hr tablet Take 1 tablet (500 mg total) by mouth daily. 90 tablet 3  . loratadine (CLARITIN) 10 MG tablet Take 10 mg by mouth daily.    . memantine (NAMENDA) 10 MG tablet TAKE 1 TABLET BY MOUTH TWICE A DAY 180 tablet 1  . Multiple  Vitamins-Minerals (CENTRUM SILVER ADULT 50+ PO) Take 1 capsule by mouth daily.    . rivaroxaban (XARELTO) 20 MG TABS tablet Take 1 tablet (20 mg total) by mouth daily with supper. 90 tablet 1  . SIMBRINZA 1-0.2 % SUSP PLACE 1 DROP INTO RIGHT EYE 3 TIMES A DAY  11   No current facility-administered medications for this visit.     Allergies:   Patient has no known allergies.   Social History: Social History   Socioeconomic History  . Marital status: Married    Spouse name: [name]  . Number of children: 5  . Years of education: College  . Highest education level: Not on file  Occupational History  . Not on file  Social Needs  . Financial resource strain: Not on file  . Food insecurity:    Worry: Not on file    Inability: Not on file  . Transportation needs:    Medical: Not on file    Non-medical: Not on file  Tobacco Use  . Smoking status: Never Smoker  . Smokeless tobacco: Never Used  Substance and Sexual Activity  . Alcohol use: No    Alcohol/week: 0.0 oz  . Drug use: No  . Sexual activity: Not on file  Lifestyle  . Physical activity:    Days per week: Not on file    Minutes per session: Not on file  . Stress: Not on file  Relationships  . Social connections:    Talks on phone: Not on file    Gets together: Not on file    Attends religious service: Not on file    Active member of club or organization: Not on file    Attends meetings of clubs or organizations: Not on file    Relationship status: Not on file  . Intimate partner violence:    Fear of current or ex partner: Not on file    Emotionally abused: Not on file    Physically abused: Not on file    Forced sexual activity: Not on file  Other Topics Concern  . Not on file  Social History Narrative   Patient is married [name]) and lives at home with wife and two children.   Patient has five adult children.   Patient is retired.   Patient has a college education.   Patient is right-handed.   Patient drinks  two cups of coffee daily, 1/2 can of soda daily and tea- 2-3 times per week.    Family History: Family History  Problem Relation Age of Onset  . Heart attack Father   . Cancer - Lung Sister   . Thyroid disease Child      Review of Systems: All other systems reviewed and are otherwise negative except as noted above.   Physical Exam: VS:  BP 124/70   Pulse 70   Ht 5\' 10"  (1.778 m)   Wt 212 lb (  96.2 kg)   SpO2 94%   BMI 30.42 kg/m  , BMI Body mass index is 30.42 kg/m.  GEN- The patient is elderly and obese appearing, alert and oriented x 3 today.   HEENT: normocephalic, atraumatic; sclera clear, conjunctiva pink; hearing intact; oropharynx clear; neck supple  Lungs- Clear to ausculation bilaterally, normal work of breathing.  No wheezes, rales, rhonchi Heart- Regular rate and rhythm (paced) GI- soft, non-tender, non-distended, bowel sounds present  Extremities- no clubbing, cyanosis, or edema  MS- no significant deformity or atrophy Skin- warm and dry, no rash or lesion; PPM pocket well healed Psych- euthymic mood, full affect Neuro- strength and sensation are intact  PPM Interrogation- reviewed in detail today,  See PACEART report  EKG:  EKG is not ordered today.  Recent Labs: 04/11/2017: ALT 14; BNP 45.1; TSH 1.210 07/07/2017: BUN 18; Creatinine, Ser 1.24; Hemoglobin 12.1; Platelets 323; Potassium 4.4; Sodium 138   Wt Readings from Last 3 Encounters:  08/12/17 212 lb (96.2 kg)  07/19/17 210 lb (95.3 kg)  07/11/17 210 lb 9.6 oz (95.5 kg)     Assessment and Plan:  1.  Complete heart block Normal PPM function See Pace Art report No changes today  2.  Persistent atrial fibrillation Back in AF post cardioversion. He does feel improved but is back in AF today. He did not maintain SR for more than a day after cardioversion. We had a lengthy discussion regarding rate vs rhythm control. He would like to pursue rhythm control to see if energy level improves further.  Will  plan on Tikosyn admission in a couple of weeks (granddaughter being induced today with their greatgrandchild).  QTc is ~469msec when correcting for paced rhythm.  He will call and check on price of tikosyn. Meds reviewed with pharmacist today and ok.  Continue Xarelto for CHADS2VASC of 5  3.  Hypotension Stable No change required today  4.  Chronic systolic heart failure Stable No change required today   Current medicines are reviewed at length with the patient today.   The patient does not have concerns regarding his medicines.  The following changes were made today:  none  Labs/ tests ordered today include: none Orders Placed This Encounter  Procedures  . EKG 12-Lead     Disposition:   Follow up with Delilah Shan, AF clinic 2 weeks for [facility] start    Signed, Chanetta Marshall, NP 08/12/2017 9:38 AM  [place]  [phone] (office) [phone] (fax)

## 2017-08-12 ENCOUNTER — Encounter: Payer: Self-pay | Admitting: Nurse Practitioner

## 2017-08-12 ENCOUNTER — Ambulatory Visit: Payer: Medicare Other | Admitting: Nurse Practitioner

## 2017-08-12 VITALS — BP 124/70 | HR 70 | Ht 70.0 in | Wt 212.0 lb

## 2017-08-12 DIAGNOSIS — I951 Orthostatic hypotension: Secondary | ICD-10-CM | POA: Diagnosis not present

## 2017-08-12 DIAGNOSIS — I5022 Chronic systolic (congestive) heart failure: Secondary | ICD-10-CM

## 2017-08-12 DIAGNOSIS — I481 Persistent atrial fibrillation: Secondary | ICD-10-CM | POA: Diagnosis not present

## 2017-08-12 DIAGNOSIS — I442 Atrioventricular block, complete: Secondary | ICD-10-CM

## 2017-08-12 DIAGNOSIS — I4819 Other persistent atrial fibrillation: Secondary | ICD-10-CM

## 2017-08-12 NOTE — Patient Instructions (Addendum)
Medication Instructions:   Your physician recommends that you continue on your current medications as directed. Please refer to the Current Medication list given to you today.   If you need a refill on your cardiac medications before your next appointment, please call your pharmacy.  Labwork: NONE ORDERED  TODAY    Testing/Procedures:NONE ORDERED  TODAY'   Follow-Up:  AS SCHEDULED WITH AFIB CLINIC    Any Other Special Instructions Will Be Listed Below (If Applicable).

## 2017-08-15 ENCOUNTER — Telehealth: Payer: Self-pay | Admitting: Pharmacist

## 2017-08-15 NOTE — Telephone Encounter (Signed)
Medication list reviewed in anticipation of upcoming Tikosyn initiation. Patient is not taking any contraindicated or QTc prolonging medications.   Patient is anticoagulated on Xaretlo on the appropriate dose. Please ensure that patient has not missed any anticoagulation doses in the 3 weeks prior to Tikosyn initiation.   Patient will need to be counseled to avoid use of Benadryl while on Tikosyn and in the 2-3 days prior to Tikosyn initiation.

## 2017-08-17 LAB — CUP PACEART INCLINIC DEVICE CHECK
Date Time Interrogation Session: 20190710123706
Implantable Lead Implant Date: 20160930
Implantable Lead Implant Date: 20160930
Implantable Lead Implant Date: 20160930
Implantable Lead Location: 753858
Implantable Lead Location: 753859
Implantable Lead Location: 753860
Implantable Pulse Generator Implant Date: 20160930
Pulse Gen Model: 3262
Pulse Gen Serial Number: 7798436

## 2017-08-23 ENCOUNTER — Other Ambulatory Visit: Payer: Self-pay

## 2017-08-23 ENCOUNTER — Encounter (HOSPITAL_COMMUNITY): Payer: Self-pay | Admitting: General Practice

## 2017-08-23 ENCOUNTER — Inpatient Hospital Stay (HOSPITAL_COMMUNITY)
Admission: RE | Admit: 2017-08-23 | Discharge: 2017-08-26 | DRG: 309 | Disposition: A | Discharge status: Home / Self Care | Payer: Medicare Other | Source: Ambulatory Visit | Attending: Internal Medicine | Admitting: Internal Medicine

## 2017-08-23 ENCOUNTER — Encounter (HOSPITAL_COMMUNITY): Payer: Self-pay | Admitting: Nurse Practitioner

## 2017-08-23 ENCOUNTER — Ambulatory Visit (HOSPITAL_BASED_OUTPATIENT_CLINIC_OR_DEPARTMENT_OTHER)
Admission: RE | Admit: 2017-08-23 | Discharge: 2017-08-23 | Disposition: A | Discharge status: Home / Self Care | Payer: Medicare Other | Source: Ambulatory Visit | Attending: Nurse Practitioner | Admitting: Nurse Practitioner

## 2017-08-23 VITALS — BP 132/64 | HR 70 | Ht 70.0 in | Wt 206.4 lb

## 2017-08-23 DIAGNOSIS — I4819 Other persistent atrial fibrillation: Secondary | ICD-10-CM

## 2017-08-23 DIAGNOSIS — I959 Hypotension, unspecified: Secondary | ICD-10-CM | POA: Diagnosis present

## 2017-08-23 DIAGNOSIS — I481 Persistent atrial fibrillation: Principal | ICD-10-CM

## 2017-08-23 DIAGNOSIS — I428 Other cardiomyopathies: Secondary | ICD-10-CM | POA: Diagnosis present

## 2017-08-23 DIAGNOSIS — Z6829 Body mass index (BMI) 29.0-29.9, adult: Secondary | ICD-10-CM | POA: Diagnosis not present

## 2017-08-23 DIAGNOSIS — Z8249 Family history of ischemic heart disease and other diseases of the circulatory system: Secondary | ICD-10-CM | POA: Diagnosis not present

## 2017-08-23 DIAGNOSIS — I442 Atrioventricular block, complete: Secondary | ICD-10-CM | POA: Diagnosis present

## 2017-08-23 DIAGNOSIS — G4730 Sleep apnea, unspecified: Secondary | ICD-10-CM | POA: Diagnosis present

## 2017-08-23 DIAGNOSIS — I77810 Thoracic aortic ectasia: Secondary | ICD-10-CM | POA: Diagnosis present

## 2017-08-23 DIAGNOSIS — I5022 Chronic systolic (congestive) heart failure: Secondary | ICD-10-CM | POA: Diagnosis present

## 2017-08-23 DIAGNOSIS — E039 Hypothyroidism, unspecified: Secondary | ICD-10-CM | POA: Diagnosis present

## 2017-08-23 DIAGNOSIS — M069 Rheumatoid arthritis, unspecified: Secondary | ICD-10-CM | POA: Diagnosis present

## 2017-08-23 DIAGNOSIS — Z950 Presence of cardiac pacemaker: Secondary | ICD-10-CM

## 2017-08-23 DIAGNOSIS — Z79899 Other long term (current) drug therapy: Secondary | ICD-10-CM

## 2017-08-23 DIAGNOSIS — Z5181 Encounter for therapeutic drug level monitoring: Secondary | ICD-10-CM

## 2017-08-23 DIAGNOSIS — Z8673 Personal history of transient ischemic attack (TIA), and cerebral infarction without residual deficits: Secondary | ICD-10-CM | POA: Diagnosis not present

## 2017-08-23 DIAGNOSIS — I2510 Atherosclerotic heart disease of native coronary artery without angina pectoris: Secondary | ICD-10-CM | POA: Diagnosis present

## 2017-08-23 DIAGNOSIS — F0390 Unspecified dementia without behavioral disturbance: Secondary | ICD-10-CM | POA: Diagnosis present

## 2017-08-23 DIAGNOSIS — I110 Hypertensive heart disease with heart failure: Secondary | ICD-10-CM | POA: Diagnosis present

## 2017-08-23 DIAGNOSIS — E785 Hyperlipidemia, unspecified: Secondary | ICD-10-CM | POA: Diagnosis present

## 2017-08-23 DIAGNOSIS — Z7901 Long term (current) use of anticoagulants: Secondary | ICD-10-CM | POA: Diagnosis not present

## 2017-08-23 DIAGNOSIS — R569 Unspecified convulsions: Secondary | ICD-10-CM | POA: Diagnosis present

## 2017-08-23 DIAGNOSIS — E669 Obesity, unspecified: Secondary | ICD-10-CM | POA: Diagnosis present

## 2017-08-23 DIAGNOSIS — Z85828 Personal history of other malignant neoplasm of skin: Secondary | ICD-10-CM

## 2017-08-23 DIAGNOSIS — I951 Orthostatic hypotension: Secondary | ICD-10-CM | POA: Diagnosis not present

## 2017-08-23 HISTORY — DX: Personal history of other diseases of the digestive system: Z87.19

## 2017-08-23 HISTORY — DX: Heart failure, unspecified: I50.9

## 2017-08-23 LAB — BASIC METABOLIC PANEL
Anion gap: 8 (ref 5–15)
BUN: 16 mg/dL (ref 8–23)
CO2: 30 mmol/L (ref 22–32)
Calcium: 9.2 mg/dL (ref 8.9–10.3)
Chloride: 100 mmol/L (ref 98–111)
Creatinine, Ser: 1.01 mg/dL (ref 0.61–1.24)
GFR calc Af Amer: >60 mL/min (ref >60)
GFR calc non Af Amer: >60 mL/min (ref >60)
Glucose, Bld: 105 mg/dL — ABNORMAL HIGH (ref 70–99)
Potassium: 4.7 mmol/L (ref 3.5–5.1)
Sodium: 138 mmol/L (ref 135–145)

## 2017-08-23 LAB — MAGNESIUM: Magnesium: 2.2 mg/dL (ref 1.7–2.4)

## 2017-08-23 MED ORDER — ATORVASTATIN CALCIUM 10 MG PO TABS
10.0000 mg | ORAL_TABLET | Freq: Every day | ORAL | Status: DC
  Administered 2017-08-24 – 2017-08-26 (×3): 10 mg via ORAL
  Filled 2017-08-23 (×3): qty 1

## 2017-08-23 MED ORDER — SODIUM CHLORIDE 0.9% FLUSH
3.0000 mL | Freq: Two times a day (BID) | INTRAVENOUS | Status: DC
  Administered 2017-08-23 – 2017-08-26 (×5): 3 mL via INTRAVENOUS

## 2017-08-23 MED ORDER — LORATADINE 10 MG PO TABS
10.0000 mg | ORAL_TABLET | Freq: Every day | ORAL | Status: DC
  Administered 2017-08-24 – 2017-08-26 (×3): 10 mg via ORAL
  Filled 2017-08-23 (×3): qty 1

## 2017-08-23 MED ORDER — MEMANTINE HCL 5 MG PO TABS
10.0000 mg | ORAL_TABLET | Freq: Two times a day (BID) | ORAL | Status: DC
  Administered 2017-08-23 – 2017-08-26 (×6): 10 mg via ORAL
  Filled 2017-08-23 (×6): qty 2

## 2017-08-23 MED ORDER — DOFETILIDE 500 MCG PO CAPS
500.0000 ug | ORAL_CAPSULE | Freq: Two times a day (BID) | ORAL | Status: DC
  Administered 2017-08-23 – 2017-08-24 (×2): 500 ug via ORAL
  Filled 2017-08-23 (×2): qty 1

## 2017-08-23 MED ORDER — LATANOPROST 0.005 % OP SOLN
1.0000 [drp] | Freq: Every day | OPHTHALMIC | Status: DC
  Administered 2017-08-23 – 2017-08-25 (×3): 1 [drp] via OPHTHALMIC
  Filled 2017-08-23: qty 2.5

## 2017-08-23 MED ORDER — RIVAROXABAN 20 MG PO TABS
20.0000 mg | ORAL_TABLET | Freq: Every day | ORAL | Status: DC
  Administered 2017-08-23 – 2017-08-25 (×3): 20 mg via ORAL
  Filled 2017-08-23 (×3): qty 1

## 2017-08-23 MED ORDER — SODIUM CHLORIDE 0.9% FLUSH: 3.0000 mL | INTRAVENOUS | Status: DC | PRN

## 2017-08-23 MED ORDER — SODIUM CHLORIDE 0.9 % IV SOLN: 250.0000 mL | INTRAVENOUS | Status: DC | PRN

## 2017-08-23 MED ORDER — ACETAMINOPHEN 500 MG PO TABS: 500.0000 mg | ORAL_TABLET | Freq: Four times a day (QID) | ORAL | Status: DC | PRN

## 2017-08-23 MED ORDER — LEVETIRACETAM ER 500 MG PO TB24
500.0000 mg | ORAL_TABLET | Freq: Every day | ORAL | Status: DC
  Administered 2017-08-24 – 2017-08-26 (×3): 500 mg via ORAL
  Filled 2017-08-23 (×3): qty 1

## 2017-08-23 NOTE — Progress Notes (Signed)
Pharmacy Review for Dofetilide (Tikosyn) Initiation  Admit Complaint: 84 y.o. male admitted 08/23/2017 with atrial fibrillation to be initiated on dofetilide.   Assessment:  Patient Exclusion Criteria: If any screening criteria checked as "Yes", then  patient  should NOT receive dofetilide until criteria item is corrected. If "Yes" please indicate correction plan.  YES  NO Patient  Exclusion Criteria Correction Plan  [x]  []  Baseline QTc interval is greater than or equal to 440 msec. IF above YES box checked dofetilide contraindicated unless patient has ICD; then may proceed if QTc 500-550 msec or with known ventricular conduction abnormalities may proceed with QTc 550-600 msec. QTc =   Per NP note - "QT measured by myself is 455ms, corrects to 475, given paced QRS duration 133ms, is OK to proceed"   []  [x]  Magnesium level is less than 1.8 mEq/l : Last magnesium:  Lab Results  Component Value Date   MG 2.2 08/23/2017         []  [x]  Potassium level is less than 4 mEq/l : Last potassium:  Lab Results  Component Value Date   K 4.7 08/23/2017         []  [x]  Patient is known or suspected to have a digoxin level greater than 2 ng/ml: No results found for: DIGOXIN    []  [x]  Creatinine clearance less than 20 ml/min (calculated using Cockcroft-Gault, actual body weight and serum creatinine): Estimated Creatinine Clearance: 62.5 mL/min (by C-G formula based on SCr of 1.01 mg/dL).    []  [x]  Patient has received drugs known to prolong the QT intervals within the last 48 hours (phenothiazines, tricyclics or tetracyclic antidepressants, erythromycin, H-1 antihistamines, cisapride, fluoroquinolones, azithromycin). Drugs not listed above may have an, as yet, undetected potential to prolong the QT interval, updated information on QT prolonging agents is available at this website:QT prolonging agents   []  [x]  Patient received a dose of hydrochlorothiazide (Oretic) alone or in any combination including  triamterene (Dyazide, Maxzide) in the last 48 hours.   []  [x]  Patient received a medication known to increase dofetilide plasma concentrations prior to initial dofetilide dose:  . Trimethoprim (Primsol, Proloprim) in the last 36 hours . Verapamil (Calan, Verelan) in the last 36 hours or a sustained release dose in the last 72 hours . Megestrol (Megace) in the last 5 days  . Cimetidine (Tagamet) in the last 6 hours . Ketoconazole (Nizoral) in the last 24 hours . Itraconazole (Sporanox) in the last 48 hours  . Prochlorperazine (Compazine) in the last 36 hours    []  [x]  Patient is known to have a history of torsades de pointes; congenital or acquired long QT syndromes.   []  [x]  Patient has received a Class 1 antiarrhythmic with less than 2 half-lives since last dose. (Disopyramide, Quinidine, Procainamide, Lidocaine, Mexiletine, Flecainide, Propafenone)   []  [x]  Patient has received amiodarone therapy in the past 3 months or amiodarone level is greater than 0.3 ng/ml.    Patient has been appropriately anticoagulated with Xarelto.  Ordering provider was confirmed at [email or web address] if they are not listed on the [facility] Prescribers list.  Goal of Therapy: Follow renal function, electrolytes, potential drug interactions, and dose adjustment. Provide education and 1 week supply at discharge.  Plan:  [x]   Physician selected initial dose within range recommended for patients level of renal function - will monitor for response.  []   Physician selected initial dose outside of range recommended for patients level of renal function - will discuss if the dose should  be altered at this time.   Select One Calculated CrCl  Dose q12h  [x]  > 60 ml/min 500 mcg  []  40-60 ml/min 250 mcg  []  20-40 ml/min 125 mcg   2. Follow up QTc after the first 5 doses, renal function, electrolytes (K & Mg) daily x 3     days, dose adjustment, success of initiation and facilitate 1 week discharge supply as      clinically indicated.  3. Initiate Tikosyn education video (Call [phone] and ask for Tikosyn Video # 116).  4. Place Enrollment Form on the chart for discharge supply of dofetilide.   Marguerite Olea, [facility] Clinical Pharmacist Phone [phone]  08/23/2017 5:15 PM

## 2017-08-23 NOTE — Progress Notes (Signed)
Primary Care Physician: Lajean Manes, MD Primary Cardiologist: Claiborne Billings Primary Electrophysiologist: Rayann Heman   Thomas Long is a 84 y.o. male with a history of persistent atrial fibrillation who presents for consultation in the [facility].  He presents today for Tikosyn initiation. He recently underwent DCCV that only held for 1 day with reversion to AF.    Today, he denies symptoms of palpitations, chest pain, orthopnea, PND, dizziness, presyncope, syncope, snoring, daytime somnolence, bleeding, or neurologic sequela. The patient is tolerating medications without difficulties and is otherwise without complaint today.    Past Medical History:  Diagnosis Date  . CAD (coronary artery disease)    a. LHC 10/2014: nonobstructive disease, 30% LAD with bridging up to 50%.  . Chronic pain   . Complete heart block ([place]) 11/07/2014   a. s/p [facility] Quadra Allure MP RF model [id number] (serial number [id number]) biventricular pacemaker 10/2014.  Marland Kitchen Dementia    "memory lapses q now and then" (11/08/2014)  . Depression   . Facial cellulitis 02/19/2009   "related to [facility] & having skin cancer zapped; took him off the Embrel"  . Hyperlipidemia   . Hypertension   . Hypothyroidism   . NICM (nonischemic cardiomyopathy) ([place])    a. 10/2014: EF 45%. (Prev 40-45% by echo 08/2014).  . Orthostatic hypotension   . Rheumatoid arthritis ([place])    "hands mainly" (11/08/2014)  . Seizures ([place]) 2011   "vs stroke; never determined which it was; on sz RX since"  (11/08/2014)  . Sleep apnea    "gone since losing weight"  . Stroke [facility]) [address]11   "vs seizure; never determined which it was; on sz RX since"  (11/08/2014)  . TIA (transient ischemic attack)    Past Surgical History:  Procedure Laterality Date  . APPENDECTOMY  ~ 1988  . CARDIAC CATHETERIZATION  04/01/09   which showed low normal EF at 50% with question of underlying borderline area of minimal inferoapical  hypercontractility. He had mild coronary obstructive disease with coronary calcification and segmental 20% narrowing in the LAD proximally, 20% in the mid LAD with muscle bridging. There was calcification at the ostium of the RCA, and 20 to 30%narrowing of the proximal to mid RCA with 30% distal n  . CARDIAC CATHETERIZATION N/A 11/07/2014   Procedure: Left Heart Cath and Coronary Angiography/ temp wire;  Surgeon: [name], MD;  Location: [place] [facility];  Service: Cardiovascular;  Laterality: N/A;  . CARDIOVERSION N/A 07/19/2017   Procedure: CARDIOVERSION;  Surgeon: Acie Fredrickson Wonda Cheng, MD;  Location: [place];  Service: Cardiovascular;  Laterality: N/A;  . CATARACT EXTRACTION W/ INTRAOCULAR LENS  IMPLANT, BILATERAL Bilateral early 2000's  . CHOLECYSTECTOMY OPEN  1980's  . EP IMPLANTABLE DEVICE N/A 11/08/2014   a. STJ CRTP implanted by Dr Rayann Heman  . EP IMPLANTABLE DEVICE N/A 11/10/2014   RA lead revision Dr Rayann Heman  . EXPLORATORY LAPAROTOMY  1990's   "put intestines back in & added mesh"  . FALSE ANEURYSM REPAIR Right 11/15/2014   Procedure: REPAIR OF RIGHT FEMORAL FALSE ANEURYSM ;  Surgeon: Rosetta Posner, MD;  Location: [place];  Service: Vascular;  Laterality: Right;  . KNEE CARTILAGE SURGERY Left 1990's   "opened it up"  . TONSILLECTOMY      Current Outpatient Medications  Medication Sig Dispense Refill  . acetaminophen (TYLENOL) 500 MG tablet Take 500 mg by mouth every 6 (six) hours as needed for moderate pain or headache.    Marland Kitchen  atorvastatin (LIPITOR) 10 MG tablet TAKE 1 TABLET DAILY 90 tablet 3  . latanoprost (XALATAN) 0.005 % ophthalmic solution Place 1 drop into both eyes at bedtime.   3  . levETIRAcetam (KEPPRA XR) 500 MG 24 hr tablet Take 1 tablet (500 mg total) by mouth daily. 90 tablet 3  . loratadine (CLARITIN) 10 MG tablet Take 10 mg by mouth daily.    . memantine (NAMENDA) 10 MG tablet TAKE 1 TABLET BY MOUTH TWICE A DAY 180 tablet 1  . Multiple Vitamins-Minerals (CENTRUM  SILVER ADULT 50+ PO) Take 1 capsule by mouth daily.    . rivaroxaban (XARELTO) 20 MG TABS tablet Take 1 tablet (20 mg total) by mouth daily with supper. 90 tablet 1   No current facility-administered medications for this encounter.     No Known Allergies  Social History   Socioeconomic History  . Marital status: Married    Spouse name: [name]  . Number of children: 5  . Years of education: College  . Highest education level: Not on file  Occupational History  . Not on file  Social Needs  . Financial resource strain: Not on file  . Food insecurity:    Worry: Not on file    Inability: Not on file  . Transportation needs:    Medical: Not on file    Non-medical: Not on file  Tobacco Use  . Smoking status: Never Smoker  . Smokeless tobacco: Never Used  Substance and Sexual Activity  . Alcohol use: No    Alcohol/week: 0.0 oz  . Drug use: No  . Sexual activity: Not on file  Lifestyle  . Physical activity:    Days per week: Not on file    Minutes per session: Not on file  . Stress: Not on file  Relationships  . Social connections:    Talks on phone: Not on file    Gets together: Not on file    Attends religious service: Not on file    Active member of club or organization: Not on file    Attends meetings of clubs or organizations: Not on file    Relationship status: Not on file  . Intimate partner violence:    Fear of current or ex partner: Not on file    Emotionally abused: Not on file    Physically abused: Not on file    Forced sexual activity: Not on file  Other Topics Concern  . Not on file  Social History Narrative   Patient is married [name]) and lives at home with wife and two children.   Patient has five adult children.   Patient is retired.   Patient has a college education.   Patient is right-handed.   Patient drinks two cups of coffee daily, 1/2 can of soda daily and tea- 2-3 times per week.    Family History  Problem Relation Age of Onset  . Heart  attack Father   . Cancer - Lung Sister   . Thyroid disease Child    The patient does not have a history of early familial atrial fibrillation or other arrhythmias.  ROS- All systems are reviewed and negative except as per the HPI above.  Physical Exam: Vitals:   08/23/17 1151  BP: 132/64  Pulse: 70  Weight: 206 lb 6.4 oz (93.6 kg)  Height: 5\' 10"  (1.778 m)    GEN- The patient is elderly and obese appearing, alert and oriented x 3 today.   Head- normocephalic, atraumatic Eyes-  Sclera clear, conjunctiva pink Ears- hearing intact Oropharynx- clear Neck- supple  Lungs- Clear to ausculation bilaterally, normal work of breathing Heart- Regular rate and rhythm (paced) GI- soft, NT, ND, + BS Extremities- no clubbing, cyanosis, or edema MS- no significant deformity or atrophy Skin- no rash or lesion Psych- euthymic mood, full affect Neuro- strength and sensation are intact  Wt Readings from Last 3 Encounters:  08/23/17 206 lb 6.4 oz (93.6 kg)  08/12/17 212 lb (96.2 kg)  07/19/17 210 lb (95.3 kg)    EKG today demonstrates atrial fibrillation with V pacing, QRS 155msec, manual QTc 422msec when accounting for QRS Echo 04/2017 demonstrated EF 70-96%, grade 1 diastolic dysfunction  Epic records are reviewed at length today  Assessment and Plan:  1. Persistent atrial fibrillation The patient has symptomatic persistent atrial fibrillation. He has failed cardioversion off AAD therapy.  With persistent AF, I think Tikosyn is our best option to restore SR.  QRS is acceptable by manual calculation when accounting for paced QRS He reports compliance with Xarelto with no missed doses. BMET, Mg drawn  2. Complete heart block Recent normal pacemaker function  3.  Hypotension Stable No change required today  4.  Chronic systolic heart failure Stable No change required today    Chanetta Marshall, NP 08/23/2017 11:57 AM

## 2017-08-23 NOTE — Addendum Note (Signed)
Encounter addended by: Patsey Berthold, NP on: 08/23/2017 2:50 PM  Actions taken: Charge Capture section accepted

## 2017-08-23 NOTE — H&P (Addendum)
Cardiology Admission History and Physical:   Patient ID: Thomas Long; MRN: [id number]; DOB: Jul 26, 1931   Admission date: 08/23/2017  Primary Care Provider: Lajean Manes, MD Primary Cardiologist: Dr. Claiborne Billings Primary Electrophysiologist:  Dr. Rayann Heman  Chief Complaint:  Tikosyn initiation  Patient Profile:   Thomas Long is a 83 y.o. male with a history of non-obstructive CAD, CHB w/ CRT-P, HTN, HLD, CVA, TIA, NICM with recovered LVEF, and persistent AFib  History of Present Illness:   Thomas Long is being admitted for Tikosyn initation.  Rhythm control efforts have resulted in ERAF after DCCV and the patient feels the AFib is contributing in part to his fatigue.  He otherwise denies any active symptoms.  No CP or SOB.  He walks with the aid of cane.  His daughter at bedside reports since his stroke he has had some baseline confusion and they will have family present at night with him  The patient's wife confirms no missed doses of his Xarelto in the last 3 weeks   Device History: STJ CRTP implanted 2016 for complete heart block, CHF    Past Medical History:  Diagnosis Date  . CAD (coronary artery disease)    a. LHC 10/2014: nonobstructive disease, 30% LAD with bridging up to 50%.  . Chronic pain   . Complete heart block ([place]) 11/07/2014   a. s/p [facility] Quadra Allure MP RF model [id number] (serial number [id number]) biventricular pacemaker 10/2014.  Marland Kitchen Dementia    "memory lapses q now and then" (11/08/2014)  . Depression   . Facial cellulitis 02/19/2009   "related to [facility] & having skin cancer zapped; took him off the [place]"  . Hyperlipidemia   . Hypertension   . Hypothyroidism   . NICM (nonischemic cardiomyopathy) ([place])    a. 10/2014: EF 45%. (Prev 40-45% by echo 08/2014).  . Orthostatic hypotension   . Rheumatoid arthritis ([place])    "hands mainly" (11/08/2014)  . Seizures ([place]) 2011   "vs stroke; never determined which it was; on sz RX since"  (11/08/2014)  . Sleep apnea     "gone since losing weight"  . Stroke [facility]) 201[address]   "vs seizure; never determined which it was; on sz RX since"  (11/08/2014)  . TIA (transient ischemic attack)     Past Surgical History:  Procedure Laterality Date  . APPENDECTOMY  ~ 1988  . CARDIAC CATHETERIZATION  04/01/09   which showed low normal EF at 50% with question of underlying borderline area of minimal inferoapical hypercontractility. He had mild coronary obstructive disease with coronary calcification and segmental 20% narrowing in the LAD proximally, 20% in the mid LAD with muscle bridging. There was calcification at the ostium of the RCA, and 20 to 30%narrowing of the proximal to mid RCA with 30% distal n  . CARDIAC CATHETERIZATION N/A 11/07/2014   Procedure: Left Heart Cath and Coronary Angiography/ temp wire;  Surgeon: [name], MD;  Location: [place] CV LAB;  Service: Cardiovascular;  Laterality: N/A;  . CARDIOVERSION N/A 07/19/2017   Procedure: CARDIOVERSION;  Surgeon: Acie Fredrickson Wonda Cheng, MD;  Location: [place];  Service: Cardiovascular;  Laterality: N/A;  . CATARACT EXTRACTION W/ INTRAOCULAR LENS  IMPLANT, BILATERAL Bilateral early 2000's  . CHOLECYSTECTOMY OPEN  1980's  . EP IMPLANTABLE DEVICE N/A 11/08/2014   a. STJ CRTP implanted by Dr Rayann Heman  . EP IMPLANTABLE DEVICE N/A 11/10/2014   RA lead revision Dr Rayann Heman  . EXPLORATORY LAPAROTOMY  1990's   "put intestines back  in & added mesh"  . FALSE ANEURYSM REPAIR Right 11/15/2014   Procedure: REPAIR OF RIGHT FEMORAL FALSE ANEURYSM ;  Surgeon: Rosetta Posner, MD;  Location: [place];  Service: Vascular;  Laterality: Right;  . KNEE CARTILAGE SURGERY Left 1990's   "opened it up"  . TONSILLECTOMY       Medications Prior to Admission: Prior to Admission medications   Medication Sig Start Date End Date Taking? Authorizing Provider  acetaminophen (TYLENOL) 500 MG tablet Take 500 mg by mouth every 6 (six) hours as needed for moderate pain or headache.    [provider]  atorvastatin (LIPITOR) 10 MG tablet TAKE 1 TABLET DAILY 04/12/17   [name], MD  latanoprost (XALATAN) 0.005 % ophthalmic solution Place 1 drop into both eyes at bedtime.  04/12/14   [provider]  levETIRAcetam (KEPPRA XR) 500 MG 24 hr tablet Take 1 tablet (500 mg total) by mouth daily. 01/10/17   Dohmeier, Asencion Partridge, MD  loratadine (CLARITIN) 10 MG tablet Take 10 mg by mouth daily.    [provider]  memantine (NAMENDA) 10 MG tablet TAKE 1 TABLET BY MOUTH TWICE A DAY 07/15/17   Dohmeier, Asencion Partridge, MD  Multiple Vitamins-Minerals (CENTRUM SILVER ADULT 50+ PO) Take 1 capsule by mouth daily.    [provider]  rivaroxaban (XARELTO) 20 MG TABS tablet Take 1 tablet (20 mg total) by mouth daily with supper. 04/13/17   [name], MD     Allergies:   No Known Allergies  Social History:   Social History   Socioeconomic History  . Marital status: Married    Spouse name: [name]  . Number of children: 5  . Years of education: College  . Highest education level: Not on file  Occupational History  . Not on file  Social Needs  . Financial resource strain: Not on file  . Food insecurity:    Worry: Not on file    Inability: Not on file  . Transportation needs:    Medical: Not on file    Non-medical: Not on file  Tobacco Use  . Smoking status: Never Smoker  . Smokeless tobacco: Never Used  Substance and Sexual Activity  . Alcohol use: No    Alcohol/week: 0.0 oz  . Drug use: No  . Sexual activity: Not on file  Lifestyle  . Physical activity:    Days per week: Not on file    Minutes per session: Not on file  . Stress: Not on file  Relationships  . Social connections:    Talks on phone: Not on file    Gets together: Not on file    Attends religious service: Not on file    Active member of club or organization: Not on file    Attends meetings of clubs or organizations: Not on file    Relationship status: Not on file  . Intimate partner  violence:    Fear of current or ex partner: Not on file    Emotionally abused: Not on file    Physically abused: Not on file    Forced sexual activity: Not on file  Other Topics Concern  . Not on file  Social History Narrative   Patient is married [name]) and lives at home with wife and two children.   Patient has five adult children.   Patient is retired.   Patient has a college education.   Patient is right-handed.   Patient drinks two cups of coffee daily,  1/2 can of soda daily and tea- 2-3 times per week.    Family History:  The patient's family history includes Cancer - Lung in his sister; Heart attack in his father; Thyroid disease in his child.    ROS:  Please see the history of present illness.  All other ROS reviewed and negative.     Physical Exam/Data:   Vitals:   08/23/17 1526  BP: 115/60  Pulse: 69  Temp: (!) 97.5 F (36.4 C)  TempSrc: Oral  SpO2: 95%   No intake or output data in the 24 hours ending 08/23/17 1544 There were no vitals filed for this visit. There is no height or weight on file to calculate BMI.  General:  Well nourished, well developed, in no acute distress HEENT: normal Lymph: no adenopathy Neck: no JVD Endocrine:  No thryomegaly Vascular: No carotid bruits  Cardiac:  RRR (paced); 1/6 SM, no gallops or rubs Lungs:  CTA b/l, no wheezing, rhonchi or rales  Abd: soft, nontender, obese Ext: no edema Musculoskeletal:  No deformities, age appropriate atrophy Skin: warm and dry  Neuro:  No gross focal abnormalities noted, patient is appropriate, AAO x3 Psych:  Normal affect    EKG:  The ECG that was done today was personally reviewed and demonstrates  AFib 70bpm, V paced  Relevant CV Studies:  04/15/17 TTE Study Conclusions - Left ventricle: The cavity size was normal. Wall thickness was   increased in a pattern of mild LVH. Systolic function was normal.   The estimated ejection fraction was in the range of 50% to 55%.   Wall motion  was normal; there were no regional wall motion   abnormalities. Doppler parameters are consistent with abnormal   left ventricular relaxation (grade 1 diastolic dysfunction). - Ventricular septum: Septal motion showed abnormal function and   dyssynergy. - Aortic root: The aortic root was mildly dilated. - Ascending aorta: The ascending aorta was mildly dilated. - Mitral valve: Calcified annulus. There was mild regurgitation. Impressions: - Low normal LV systolic function; mild diastolic dysfunction; mild   LVH; mildly dilated aortic root; mild MR.  Laboratory Data:  Chemistry Recent Labs  Lab 08/23/17 1138  NA 138  K 4.7  CL 100  CO2 30  GLUCOSE 105*  BUN 16  CREATININE 1.01  CALCIUM 9.2  GFRNONAA >60  GFRAA >60  ANIONGAP 8    No results for input(s): PROT, ALBUMIN, AST, ALT, ALKPHOS, BILITOT in the last 168 hours. HematologyNo results for input(s): WBC, RBC, HGB, HCT, MCV, MCH, MCHC, RDW, PLT in the last 168 hours. Cardiac EnzymesNo results for input(s): TROPONINI in the last 168 hours. No results for input(s): TROPIPOC in the last 168 hours.  BNPNo results for input(s): BNP, PROBNP in the last 168 hours.  DDimer No results for input(s): DDIMER in the last 168 hours.  Radiology/Studies:  No results found.  Assessment and Plan:   1. Persistent AFib     CHA2DS2Vasc is 7, on Xarelto     Patient confirms no missed doses of Xarelto in the last 3 weeks     K+ 4.7     Mag 2.2     Creat 1.01 (Calc CrCl is 72)     QT measured by myself is 465ms, corrects to 475, given paced QRS duration 167ms, is OK to proceed  DCCV Thursday if not in SR  2. HTN     Continue home regime  3. CHB w/CRT-P     Device interrogation 08/12/17  noted intact function     Today's EKG suggests unipolor, will ask SJM to inetrrogate  4. NICM with recovered LVEF     No exam findings or symptoms to suggest fluid OL   For questions or updates, please contact [place] Please consult  [email or web address] for contact info under Cardiology/STEMI.    Signed, Baldwin Jamaica, PA-C  08/23/2017 3:44 PM   EP Attending  Patient seen and examined. Agree with above. The patient presents for initiation of dofetilide therapy. His QT is acceptable as are electrolytes. He will undergo DCCV in 2 days if he does not revert back to NSR on his own.   Mikle Bosworth.D.

## 2017-08-24 ENCOUNTER — Other Ambulatory Visit: Payer: Self-pay

## 2017-08-24 LAB — BASIC METABOLIC PANEL
Anion gap: 7 (ref 5–15)
BUN: 17 mg/dL (ref 8–23)
CO2: 27 mmol/L (ref 22–32)
Calcium: 8.6 mg/dL — ABNORMAL LOW (ref 8.9–10.3)
Chloride: 103 mmol/L (ref 98–111)
Creatinine, Ser: 0.99 mg/dL (ref 0.61–1.24)
GFR calc Af Amer: >60 mL/min (ref >60)
GFR calc non Af Amer: >60 mL/min (ref >60)
Glucose, Bld: 99 mg/dL (ref 70–99)
Potassium: 4.1 mmol/L (ref 3.5–5.1)
Sodium: 137 mmol/L (ref 135–145)

## 2017-08-24 LAB — MAGNESIUM: Magnesium: 2.1 mg/dL (ref 1.7–2.4)

## 2017-08-24 MED ORDER — SODIUM CHLORIDE 0.9% FLUSH: 3.0000 mL | INTRAVENOUS | Status: DC | PRN

## 2017-08-24 MED ORDER — DOFETILIDE 250 MCG PO CAPS
250.0000 ug | ORAL_CAPSULE | Freq: Two times a day (BID) | ORAL | Status: DC
  Administered 2017-08-24 – 2017-08-26 (×4): 250 ug via ORAL
  Filled 2017-08-24 (×4): qty 1
  Filled 2017-08-24: qty 14

## 2017-08-24 MED ORDER — SODIUM CHLORIDE 0.9 % IV SOLN: 250.0000 mL | INTRAVENOUS | Status: DC

## 2017-08-24 MED ORDER — SODIUM CHLORIDE 0.9% FLUSH
3.0000 mL | Freq: Two times a day (BID) | INTRAVENOUS | Status: DC
  Administered 2017-08-24 – 2017-08-25 (×3): 3 mL via INTRAVENOUS

## 2017-08-24 MED ORDER — HYDROCORTISONE 1 % EX CREA
1.0000 "application " | TOPICAL_CREAM | Freq: Three times a day (TID) | CUTANEOUS | Status: DC | PRN
  Filled 2017-08-24: qty 28

## 2017-08-24 NOTE — Progress Notes (Addendum)
Progress Note  Patient Name: Thomas Long Date of Encounter: 08/24/2017  Primary Cardiologist: Dr. Claiborne Billings EP: Dr. Rayann Heman  Subjective   Slept OK last night, no problems, family at bedside.  Feels "OK", just waking up this AM  Inpatient Medications    Scheduled Meds: . atorvastatin  10 mg Oral Daily  . dofetilide  500 mcg Oral BID  . latanoprost  1 drop Both Eyes QHS  . levETIRAcetam  500 mg Oral Daily  . loratadine  10 mg Oral Daily  . memantine  10 mg Oral BID  . rivaroxaban  20 mg Oral Q supper  . sodium chloride flush  3 mL Intravenous Q12H   Continuous Infusions: . sodium chloride     PRN Meds: sodium chloride, acetaminophen, sodium chloride flush   Vital Signs    Vitals:   08/23/17 1526 08/23/17 2032 08/23/17 2318 08/24/17 0445  BP: 115/60 117/62 117/62 118/72  Pulse: 69 69 69 69  Resp:  (!) 24 (!) 24 19  Temp: (!) 97.5 F (36.4 C) 97.8 F (36.6 C) 97.8 F (36.6 C) (!) 97.4 F (36.3 C)  TempSrc: Oral Oral Oral Oral  SpO2: 95% 99%  95%  Weight:   206 lb (93.4 kg) 205 lb 11.2 oz (93.3 kg)  Height:   5\' 10"  (1.778 m)     Intake/Output Summary (Last 24 hours) at 08/24/2017 0803 Last data filed at 08/24/2017 0500 Gross per 24 hour  Intake 483 ml  Output 400 ml  Net 83 ml   Filed Weights   08/23/17 2318 08/24/17 0445  Weight: 206 lb (93.4 kg) 205 lb 11.2 oz (93.3 kg)    Telemetry    AF/VPacing, one 8 beat WCT, irregular, monomorphic - Personally Reviewed  ECG    AF, V paced 70bpm - Personally Reviewed with Dr. Lovena Le, QT manually measured 48ms, corrects 570ms, given paced QRS duration 114ms, is OK/acceptable  Physical Exam   GEN: No acute distress.   Neck: No JVD Cardiac: RRR (paced), soft SM, no rubs, or gallops.  Respiratory: CTA b/l GI: Soft, nontender, non-distended  MS: No edema; No deformity. Neuro:  Nonfocal  Psych: Normal affect   Labs    Chemistry Recent Labs  Lab 08/23/17 1138 08/24/17 0354  NA 138 137  K 4.7 4.1  CL  100 103  CO2 30 27  GLUCOSE 105* 99  BUN 16 17  CREATININE 1.01 0.99  CALCIUM 9.2 8.6*  GFRNONAA >60 >60  GFRAA >60 >60  ANIONGAP 8 7     HematologyNo results for input(s): WBC, RBC, HGB, HCT, MCV, MCH, MCHC, RDW, PLT in the last 168 hours.  Cardiac EnzymesNo results for input(s): TROPONINI in the last 168 hours. No results for input(s): TROPIPOC in the last 168 hours.   BNPNo results for input(s): BNP, PROBNP in the last 168 hours.   DDimer No results for input(s): DDIMER in the last 168 hours.   Radiology    No results found.  Cardiac Studies   04/15/17 TTE Study Conclusions - Left ventricle: The cavity size was normal. Wall thickness was increased in a pattern of mild LVH. Systolic function was normal. The estimated ejection fraction was in the range of 50% to 55%. Wall motion was normal; there were no regional wall motion abnormalities. Doppler parameters are consistent with abnormal left ventricular relaxation (grade 1 diastolic dysfunction). - Ventricular septum: Septal motion showed abnormal function and dyssynergy. - Aortic root: The aortic root was mildly dilated. - Ascending  aorta: The ascending aorta was mildly dilated. - Mitral valve: Calcified annulus. There was mild regurgitation. Impressions: - Low normal LV systolic function; mild diastolic dysfunction; mild LVH; mildly dilated aortic root; mild MR.   Patient Profile     84 y.o. male with a history of non-obstructive CAD, CHB w/ CRT-P, HTN, HLD, CVA, TIA, NICM with recovered LVEF, and persistent AFib admitted for Tikosyn loading  Device History: STJ CRTP implanted2074for complete heart block, CHF  Assessment & Plan    1. Persistent AFib     CHA2DS2Vasc is 7, on Xarelto     Patient confirms no missed doses of Xarelto in the last 3 weeks     K+ 4.1     Mag 2.1     Creat 0.99, stable     EKG reviewed with Dr. Lovena Le, QT is OK to proceed  DCCV tomorrow if not in SR, patient  agreeable, reminded. NPO after midnight tonight  2. HTN     Continue home regime  3. CHB w/CRT-P     SJM rep checked device this AM, LV lead chronically programmed unipolar, no polarity switches have opccurred, outside of AFib, no observations, pt is dependent  4. NICM with recovered LVEF     No exam findings or symptoms to suggest fluid OL    For questions or updates, please contact [place] Please consult [email or web address] for contact info under Cardiology/STEMI.      Signed, Baldwin Jamaica, PA-C  08/24/2017, 8:03 AM    EP attending  Patient seen and examined. Agree with above. The patient is doing well though remains in atrial fib. He is ventricular pacing. He will be DCCV tomorrow if he does not revert to NSR today.  Thomas Long.D.

## 2017-08-25 ENCOUNTER — Encounter (HOSPITAL_COMMUNITY): Payer: Self-pay | Admitting: Certified Registered"

## 2017-08-25 ENCOUNTER — Other Ambulatory Visit: Payer: Self-pay

## 2017-08-25 ENCOUNTER — Encounter (HOSPITAL_COMMUNITY)
Admission: RE | Disposition: A | Discharge status: Home / Self Care | Payer: Self-pay | Source: Ambulatory Visit | Attending: Internal Medicine

## 2017-08-25 ENCOUNTER — Ambulatory Visit (HOSPITAL_COMMUNITY): Admission: RE | Admit: 2017-08-25 | Payer: Medicare Other | Source: Ambulatory Visit | Admitting: Cardiology

## 2017-08-25 LAB — BASIC METABOLIC PANEL
Anion gap: 7 (ref 5–15)
BUN: 17 mg/dL (ref 8–23)
CO2: 28 mmol/L (ref 22–32)
Calcium: 8.7 mg/dL — ABNORMAL LOW (ref 8.9–10.3)
Chloride: 104 mmol/L (ref 98–111)
Creatinine, Ser: 1.04 mg/dL (ref 0.61–1.24)
GFR calc Af Amer: >60 mL/min (ref >60)
GFR calc non Af Amer: >60 mL/min (ref >60)
Glucose, Bld: 101 mg/dL — ABNORMAL HIGH (ref 70–99)
Potassium: 4.4 mmol/L (ref 3.5–5.1)
Sodium: 139 mmol/L (ref 135–145)

## 2017-08-25 LAB — MAGNESIUM: Magnesium: 2.1 mg/dL (ref 1.7–2.4)

## 2017-08-25 SURGERY — CARDIOVERSION
Anesthesia: General

## 2017-08-25 NOTE — Progress Notes (Signed)
Post dose EKG is reviewed with Dr. Lovena Le, SR 83bpm, QT is slightly better from last, continue Tikosyn 261mcg dose this evening.  Tommye Standard, PA-C

## 2017-08-25 NOTE — Progress Notes (Signed)
QTC 582.  EP is aware.  Idolina Primer, RN

## 2017-08-25 NOTE — Progress Notes (Addendum)
Progress Note  Patient Name: Thomas Long Date of Encounter: 08/25/2017  Primary Cardiologist: Dr. Claiborne Billings EP: Dr. Rayann Heman  Subjective   Slept OK last night, no problems, family at bedside.  Feels "OK", just waking up again this AM, looking forward to discharge tomorrow, his anniversary  Inpatient Medications    Scheduled Meds: . atorvastatin  10 mg Oral Daily  . dofetilide  250 mcg Oral BID  . latanoprost  1 drop Both Eyes QHS  . levETIRAcetam  500 mg Oral Daily  . loratadine  10 mg Oral Daily  . memantine  10 mg Oral BID  . rivaroxaban  20 mg Oral Q supper  . sodium chloride flush  3 mL Intravenous Q12H  . sodium chloride flush  3 mL Intravenous Q12H   Continuous Infusions: . sodium chloride    . sodium chloride Stopped (08/24/17 0815)   PRN Meds: sodium chloride, acetaminophen, hydrocortisone cream, sodium chloride flush, sodium chloride flush   Vital Signs    Vitals:   08/24/17 1502 08/24/17 2020 08/25/17 0536 08/25/17 0816  BP: (!) 101/48 109/65 130/77 125/78  Pulse: 84 89 85   Resp:  17 20   Temp: 97.7 F (36.5 C) 97.7 F (36.5 C) 97.6 F (36.4 C)   TempSrc: Oral Oral Oral   SpO2: 98% 96% 95%   Weight:   202 lb 1.6 oz (91.7 kg)   Height:        Intake/Output Summary (Last 24 hours) at 08/25/2017 0851 Last data filed at 08/25/2017 0819 Gross per 24 hour  Intake 3 ml  Output -  Net 3 ml   Filed Weights   08/23/17 2318 08/24/17 0445 08/25/17 0536  Weight: 206 lb (93.4 kg) 205 lb 11.2 oz (93.3 kg) 202 lb 1.6 oz (91.7 kg)    Telemetry    AF/VPacing, one 8 beat WCT, irregular, monomorphic - Personally Reviewed  ECG    SR,  - Personally Reviewed with Dr. Lovena Le, QT manually measured is improved from yesterday continue at reduced dose  Physical Exam   Exam is unchanged from yesterday GEN: No acute distress.   Neck: No JVD Cardiac: RRR (paced), soft SM, no rubs, or gallops.  Respiratory: CTA b/l GI: Soft, nontender, non-distended  MS: No  edema; No deformity. Neuro:  Nonfocal  Psych: Normal affect   Labs    Chemistry Recent Labs  Lab 08/23/17 1138 08/24/17 0354 08/25/17 0408  NA 138 137 139  K 4.7 4.1 4.4  CL 100 103 104  CO2 30 27 28   GLUCOSE 105* 99 101*  BUN 16 17 17   CREATININE 1.01 0.99 1.04  CALCIUM 9.2 8.6* 8.7*  GFRNONAA >60 >60 >60  GFRAA >60 >60 >60  ANIONGAP 8 7 7      HematologyNo results for input(s): WBC, RBC, HGB, HCT, MCV, MCH, MCHC, RDW, PLT in the last 168 hours.  Cardiac EnzymesNo results for input(s): TROPONINI in the last 168 hours. No results for input(s): TROPIPOC in the last 168 hours.   BNPNo results for input(s): BNP, PROBNP in the last 168 hours.   DDimer No results for input(s): DDIMER in the last 168 hours.   Radiology    No results found.  Cardiac Studies   04/15/17 TTE Study Conclusions - Left ventricle: The cavity size was normal. Wall thickness was increased in a pattern of mild LVH. Systolic function was normal. The estimated ejection fraction was in the range of 50% to 55%. Wall motion was normal; there were  no regional wall motion abnormalities. Doppler parameters are consistent with abnormal left ventricular relaxation (grade 1 diastolic dysfunction). - Ventricular septum: Septal motion showed abnormal function and dyssynergy. - Aortic root: The aortic root was mildly dilated. - Ascending aorta: The ascending aorta was mildly dilated. - Mitral valve: Calcified annulus. There was mild regurgitation. Impressions: - Low normal LV systolic function; mild diastolic dysfunction; mild LVH; mildly dilated aortic root; mild MR.   Patient Profile     84 y.o. male with a history of non-obstructive CAD, CHB w/ CRT-P, HTN, HLD, CVA, TIA, NICM with recovered LVEF, and persistent AFib admitted for Tikosyn loading  Device History: STJ CRTP implanted2021for complete heart block, CHF  Assessment & Plan    1. Persistent AFib     CHA2DS2Vasc is 7, on  Xarelto     Patient confirms no missed doses of Xarelto in the last 3 weeks     K+ 4.4     Mag 2.1     Creat 1.04, stable     EKG reviewed with Dr. Lovena Le, QT is OK to proceed at 238mcg dose  Anticipate discharge tomorrow  2. HTN     Continue home regime  3. CHB w/CRT-P     SJM rep checked device this AM, LV lead chronically programmed unipolar, no polarity switches have opccurred, outside of AFib, no observations, pt is dependent  4. NICM with recovered LVEF     No exam findings or symptoms to suggest fluid OL    For questions or updates, please contact [place] Please consult [email or web address] for contact info under Cardiology/STEMI.      Signed, Baldwin Jamaica, PA-C  08/25/2017, 8:51 AM    EP Attending  Agree with above. He will have his dose of dofetilide reduced. Anticipate DC home tomorrow.   Mikle Bosworth.D.

## 2017-08-26 ENCOUNTER — Other Ambulatory Visit: Payer: Self-pay | Admitting: Physician Assistant

## 2017-08-26 ENCOUNTER — Other Ambulatory Visit: Payer: Self-pay

## 2017-08-26 LAB — BASIC METABOLIC PANEL
Anion gap: 8 (ref 5–15)
BUN: 19 mg/dL (ref 8–23)
CO2: 27 mmol/L (ref 22–32)
Calcium: 8.7 mg/dL — ABNORMAL LOW (ref 8.9–10.3)
Chloride: 103 mmol/L (ref 98–111)
Creatinine, Ser: 1.03 mg/dL (ref 0.61–1.24)
GFR calc Af Amer: >60 mL/min (ref >60)
GFR calc non Af Amer: >60 mL/min (ref >60)
Glucose, Bld: 108 mg/dL — ABNORMAL HIGH (ref 70–99)
Potassium: 4.2 mmol/L (ref 3.5–5.1)
Sodium: 138 mmol/L (ref 135–145)

## 2017-08-26 LAB — MAGNESIUM: Magnesium: 2.1 mg/dL (ref 1.7–2.4)

## 2017-08-26 MED ORDER — DOFETILIDE 250 MCG PO CAPS: 250.0000 ug | ORAL_CAPSULE | Freq: Two times a day (BID) | ORAL | 6 refills | Status: DC

## 2017-08-26 MED ORDER — METOPROLOL SUCCINATE ER 25 MG PO TB24: 25.0000 mg | ORAL_TABLET | Freq: Every day | ORAL | 11 refills | Status: DC

## 2017-08-26 NOTE — Progress Notes (Signed)
Called and spoke with patient's daughter [name] who was present during his stay and helps care for the patient, addition of Metoprolol to the patient's regime.  This was not discussed with her/the patient at time of discharge.  I have sent the Rx to the pharmacy.  She is aware and will get it started tomorrow.  Tommye Standard, PA-C

## 2017-08-26 NOTE — Progress Notes (Signed)
Discharge instruction was given to pt and family.  Pt received 7 days supply of Tikosyn.  Idolina Primer, RN

## 2017-08-26 NOTE — Discharge Instructions (Signed)

## 2017-08-26 NOTE — Discharge Summary (Addendum)
ELECTROPHYSIOLOGY PROCEDURE DISCHARGE SUMMARY    Patient ID: Thomas Long,  MRN: [id number], DOB/AGE: [birth year] 82 y.o.  Admit date: 08/23/2017 Discharge date: 08/26/2017  Primary Care Physician: Thomas Manes, MD Primary Cardiologist: Dr. Claiborne Billings Electrophysiologist: Dr. Rayann Heman  Primary Discharge Diagnosis:  1.  persistent atrial fibrillation status post Tikosyn loading this admission      CHA2DS2Vasc is 7, on Xarelto  Secondary Discharge Diagnosis:  1. HTN 2. CHB w/CRT-P 3. NICM (recovered EF) 4. 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  Tikosyn loading  Brief HPI: Thomas Long is a 84 y.o. male with a past medical history as noted above.  He is followed outopatient for his AFib by EP service.  Risks, benefits, and alternatives to Tikosyn were reviewed with the patient who wished to proceed.    Hospital Course:  The patient was admitted and Tikosyn was initiated.  Renal function and electrolytes were followed during the hospitalization.  His QTc lengthened and required reduction of dose and remained stable then at 273mcg. He converted with drug and did not require DCCV.  He was monitored until discharge on telemetry which demonstrated AF/Vpacing >> SR/V pacing.  His sinus rates were persistently in the 80's-90's, and will add Toprol 25mg  to his regime.  On the day of discharge, he is feeling well, he has tremendous family support.  He was examined by Dr Lovena Le who considered them stable for discharge to home.  Follow-up has been arranged with the AFib clinic in 1 week and with Dr Rayann Heman in 4 weeks.   Requested prior Auth at [phone], case # is [id number]  Physical Exam: Vitals:   08/25/17 0816 08/25/17 1425 08/25/17 2109 08/26/17 0630  BP: 125/78 (!) 96/48 140/85 134/81  Pulse:  92 98 90  Resp:      Temp:  98.3 F (36.8 C) 97.8 F (36.6 C) (!) 97.5 F (36.4 C)  TempSrc:  Axillary Oral Oral  SpO2:  95% 95% 95%  Weight:    204 lb  4.8 oz (92.7 kg)  Height:        GEN- The patient is well appearing, alert and oriented x 3 today.   HEENT: normocephalic, atraumatic; sclera clear, conjunctiva pink; hearing intact; oropharynx clear; neck supple, no JVP Lymph- no cervical lymphadenopathy Lungs- CTA b/l , normal work of breathing.  No wheezes, rales, rhonchi Heart- RRR, soft SM, no rubs or gallops, PMI not laterally displaced GI- soft, non-tender, non-distended Extremities- no clubbing, cyanosis, or edema MS- no significant deformity, age appropriate atrophy Skin- warm and dry, no rash or lesion Psych- euthymic mood, full affect Neuro- strength and sensation are intact   Labs:   Lab Results  Component Value Date   WBC 8.2 07/07/2017   HGB 12.1 (L) 07/07/2017   HCT 35.2 (L) 07/07/2017   MCV 92 07/07/2017   PLT 323 07/07/2017    Recent Labs  Lab 08/26/17 0506  NA 138  K 4.2  CL 103  CO2 27  BUN 19  CREATININE 1.03  CALCIUM 8.7*  GLUCOSE 108*     Discharge Medications:  Allergies as of 08/26/2017   No Known Allergies     Medication List    TAKE these medications   acetaminophen 500 MG tablet Commonly known as:  TYLENOL Take 500 mg by mouth every 6 (six) hours as needed for moderate pain or headache.   atorvastatin 10 MG tablet Commonly known as:  LIPITOR TAKE 1  TABLET DAILY   CENTRUM SILVER ADULT 50+ PO Take 1 capsule by mouth daily.   dofetilide 250 MCG capsule Commonly known as:  TIKOSYN Take 1 capsule (250 mcg total) by mouth 2 (two) times daily.   latanoprost 0.005 % ophthalmic solution Commonly known as:  XALATAN Place 1 drop into both eyes at bedtime.   levETIRAcetam 500 MG 24 hr tablet Commonly known as:  KEPPRA XR Take 1 tablet (500 mg total) by mouth daily.   loratadine 10 MG tablet Commonly known as:  CLARITIN Take 10 mg by mouth daily.   memantine 10 MG tablet Commonly known as:  NAMENDA TAKE 1 TABLET BY MOUTH TWICE A DAY   rivaroxaban 20 MG Tabs tablet Commonly  known as:  XARELTO Take 1 tablet (20 mg total) by mouth daily with supper.       Disposition:  Home Discharge Instructions    Diet - low sodium heart healthy   Complete by:  As directed    Increase activity slowly   Complete by:  As directed      Follow-up Information    [facility] Follow up on 09/02/2017.   Specialty:  Cardiology Why:  11:30AM Contact information: [address] [id number] [place] [phone]       Patsey Berthold, NP Follow up on 10/06/2017.   Specialty:  Cardiology Why:  8:00AM Contact information: [place] [address] [phone]           Duration of Discharge Encounter: Greater than 30 minutes including physician time.  Venetia Night, PA-C 08/26/2017 1:06 PM  EP Attending  Patient seen and examined. Agree with above. The patient is stable for DC. See my prior rounding note. Followup as above.  Mikle Bosworth.D.

## 2017-08-29 ENCOUNTER — Telehealth: Payer: Self-pay

## 2017-08-29 NOTE — Telephone Encounter (Signed)
**Note De-Identified Aleigha Gilani Obfuscation** Letter received from [place] [facility] fax stating that they have denied the pts Dofetilide PA. I called both [employer] and [employer] multiple times and s/w several different people to try and appeal this denial without any success . I finally s/w Alex who did the appeal over the phone. Per Cristie Hem we should have their decision within 72 hours.

## 2017-08-30 NOTE — Telephone Encounter (Signed)
Talked with patient's daughter - dofetilide was covered at [facility] (via [employer]) for $71 for a 90 day supply. The issue with coverage was related to the tikosyn administered while inpatient which it does not appear has been filed through insurance yet.  Patient assistance forms will be filled out since his daughter states they would qualify for this as well. They will bring the paperwork needed at follow up. Pt will call if further issues.

## 2017-08-30 NOTE — Telephone Encounter (Signed)
**Note De-Identified  Obfuscation** Denial received on the pts Dofetilide PA appeal. Reason: Dofetilide is denied as a benefit exclusion. Drug coverage is limited only to those drugs covered under your medical benefit. The pts Medicare Advantage plan does not cover outpatient prescription drugs.

## 2017-08-30 NOTE — Telephone Encounter (Signed)
**Note De-Identified  Obfuscation** I called the pts wife (DPR) to discuss the denial on the pts Entresto PA and appeal. She states that they have received the same letter from [employer] that we did and that she is going to call them today to find out what is going on since she was advised prior to the pts hospitalization for his Tikosyn load that they would cover his Dofetilide.  She states that she will call me back to let me know what is going on.

## 2017-09-02 ENCOUNTER — Ambulatory Visit (HOSPITAL_COMMUNITY)
Admission: RE | Admit: 2017-09-02 | Discharge: 2017-09-02 | Disposition: A | Discharge status: Home / Self Care | Payer: Medicare Other | Source: Ambulatory Visit | Attending: Nurse Practitioner | Admitting: Nurse Practitioner

## 2017-09-02 ENCOUNTER — Encounter (HOSPITAL_COMMUNITY): Payer: Self-pay | Admitting: Nurse Practitioner

## 2017-09-02 VITALS — BP 96/58 | HR 79 | Ht 70.0 in | Wt 204.3 lb

## 2017-09-02 DIAGNOSIS — E785 Hyperlipidemia, unspecified: Secondary | ICD-10-CM | POA: Diagnosis not present

## 2017-09-02 DIAGNOSIS — Z950 Presence of cardiac pacemaker: Secondary | ICD-10-CM | POA: Diagnosis not present

## 2017-09-02 DIAGNOSIS — I4891 Unspecified atrial fibrillation: Secondary | ICD-10-CM | POA: Insufficient documentation

## 2017-09-02 DIAGNOSIS — F329 Major depressive disorder, single episode, unspecified: Secondary | ICD-10-CM | POA: Diagnosis not present

## 2017-09-02 DIAGNOSIS — I481 Persistent atrial fibrillation: Secondary | ICD-10-CM

## 2017-09-02 DIAGNOSIS — I110 Hypertensive heart disease with heart failure: Secondary | ICD-10-CM | POA: Insufficient documentation

## 2017-09-02 DIAGNOSIS — M069 Rheumatoid arthritis, unspecified: Secondary | ICD-10-CM | POA: Diagnosis not present

## 2017-09-02 DIAGNOSIS — I509 Heart failure, unspecified: Secondary | ICD-10-CM | POA: Diagnosis not present

## 2017-09-02 DIAGNOSIS — G4730 Sleep apnea, unspecified: Secondary | ICD-10-CM | POA: Insufficient documentation

## 2017-09-02 DIAGNOSIS — Z7901 Long term (current) use of anticoagulants: Secondary | ICD-10-CM | POA: Diagnosis not present

## 2017-09-02 DIAGNOSIS — I4819 Other persistent atrial fibrillation: Secondary | ICD-10-CM

## 2017-09-02 DIAGNOSIS — I442 Atrioventricular block, complete: Secondary | ICD-10-CM | POA: Diagnosis not present

## 2017-09-02 DIAGNOSIS — Z85828 Personal history of other malignant neoplasm of skin: Secondary | ICD-10-CM | POA: Diagnosis not present

## 2017-09-02 DIAGNOSIS — Z8673 Personal history of transient ischemic attack (TIA), and cerebral infarction without residual deficits: Secondary | ICD-10-CM | POA: Diagnosis not present

## 2017-09-02 DIAGNOSIS — K449 Diaphragmatic hernia without obstruction or gangrene: Secondary | ICD-10-CM | POA: Insufficient documentation

## 2017-09-02 DIAGNOSIS — Z79899 Other long term (current) drug therapy: Secondary | ICD-10-CM | POA: Diagnosis not present

## 2017-09-02 DIAGNOSIS — I2510 Atherosclerotic heart disease of native coronary artery without angina pectoris: Secondary | ICD-10-CM | POA: Insufficient documentation

## 2017-09-02 DIAGNOSIS — F0390 Unspecified dementia without behavioral disturbance: Secondary | ICD-10-CM | POA: Diagnosis not present

## 2017-09-02 LAB — BASIC METABOLIC PANEL
Anion gap: 9 (ref 5–15)
BUN: 16 mg/dL (ref 8–23)
CO2: 29 mmol/L (ref 22–32)
Calcium: 9 mg/dL (ref 8.9–10.3)
Chloride: 101 mmol/L (ref 98–111)
Creatinine, Ser: 0.95 mg/dL (ref 0.61–1.24)
GFR calc Af Amer: >60 mL/min (ref >60)
GFR calc non Af Amer: >60 mL/min (ref >60)
Glucose, Bld: 135 mg/dL — ABNORMAL HIGH (ref 70–99)
Potassium: 4.2 mmol/L (ref 3.5–5.1)
Sodium: 139 mmol/L (ref 135–145)

## 2017-09-02 LAB — MAGNESIUM: Magnesium: 2.1 mg/dL (ref 1.7–2.4)

## 2017-09-02 NOTE — Progress Notes (Signed)
Primary Care Physician: Lajean Manes, MD Referring Physician: Chanetta Marshall, NP, [facility] f/u   Thomas Long is a 84 y.o. male with a h/o afib that is int the afib clinic for f/u hospitalization for Tikosyn. He did convert with the medicine, no cardioversion was indicated. Qtc did prolong and was redcued to 250 mcg bid.He is atrial paced today. Accompanied by his daughter and his wife. He/family  is aware to take tikosyn on a regular basis.   Today, he denies symptoms of palpitations, chest pain, shortness of breath, orthopnea, PND, lower extremity edema, dizziness, presyncope, syncope, or neurologic sequela. The patient is tolerating medications without difficulties and is otherwise without complaint today.   Past Medical History:  Diagnosis Date  . CAD (coronary artery disease)    a. LHC 10/2014: nonobstructive disease, 30% LAD with bridging up to 50%.  . CHF (congestive heart failure) ([place]) 2016   "this was the reason for his pacemaker" (08/23/2017)  . Chronic pain   . Complete heart block ([place]) 11/07/2014   a. s/p [facility] Quadra Allure MP RF model [id number] (serial number [id number]) biventricular pacemaker 10/2014.  Marland Kitchen Dementia    "memory lapses q now and then; dx'd as delayed memory" (08/23/2017)  . Depression   . Facial cellulitis 02/19/2009   "related to [facility] & having skin cancer zapped; took him off the [place]"  . History of hiatal hernia 1980s  . Hyperlipidemia   . Hypertension   . NICM (nonischemic cardiomyopathy) ([place])    a. 10/2014: EF 45%. (Prev 40-45% by echo 08/2014).  . Orthostatic hypotension   . Presence of permanent cardiac pacemaker   . Rheumatoid arthritis ([place])    "hands mainly; knees"  (08/23/2017)  . Seizures ([place]) 2008   "vs stroke; never determined which it was; on sz RX since"  (08/23/2017)  . Sleep apnea 2008   "gone since losing weight" (08/23/2017)  . Stroke [facility]) 2008   "vs seizure; never determined which it was; on sz RX since"   (08/23/2017)  . TIA  (transient ischemic attack) 2008   "vs seizure; never determined which it was; on sz RX since"   (08/23/2017)   Past Surgical History:  Procedure Laterality Date  . APPENDECTOMY  ~ 1988  . CARDIAC CATHETERIZATION  04/01/09   which showed low normal EF at 50% with question of underlying borderline area of minimal inferoapical hypercontractility. He had mild coronary obstructive disease with coronary calcification and segmental 20% narrowing in the LAD proximally, 20% in the mid LAD with muscle bridging. There was calcification at the ostium of the RCA, and 20 to 30%narrowing of the proximal to mid RCA with 30% distal n  . CARDIAC CATHETERIZATION N/A 11/07/2014   Procedure: Left Heart Cath and Coronary Angiography/ temp wire;  Surgeon: [name], MD;  Location: [facility] [place] CV LAB;  Service: Cardiovascular;  Laterality: N/A;  . CARDIOVERSION N/A 07/19/2017   Procedure: CARDIOVERSION;  Surgeon: Acie Fredrickson Wonda Cheng, MD;  Location: [facility];  Service: Cardiovascular;  Laterality: N/A;  . CATARACT EXTRACTION W/ INTRAOCULAR LENS  IMPLANT, BILATERAL Bilateral early 2000's  . CHOLECYSTECTOMY OPEN  1980's  . EP IMPLANTABLE DEVICE N/A 11/08/2014   a. STJ CRTP implanted by Dr Rayann Heman  . EP IMPLANTABLE DEVICE N/A 11/10/2014   RA lead revision Dr Rayann Heman  . EXPLORATORY LAPAROTOMY  1990's   "put intestines back in & added mesh"  . FALSE ANEURYSM REPAIR Right 11/15/2014   Procedure: REPAIR OF RIGHT FEMORAL FALSE  ANEURYSM ;  Surgeon: Rosetta Posner, MD;  Location: [place];  Service: Vascular;  Laterality: Right;  . HERNIA REPAIR  1990s  . KNEE CARTILAGE SURGERY Left 1990's   "opened it up"  . TONSILLECTOMY      Current Outpatient Medications  Medication Sig Dispense Refill  . acetaminophen (TYLENOL) 500 MG tablet Take 500 mg by mouth every 6 (six) hours as needed for moderate pain or headache.    Marland Kitchen atorvastatin (LIPITOR) 10 MG tablet TAKE 1 TABLET DAILY 90 tablet 3  . dofetilide (TIKOSYN) 250 MCG capsule Take  1 capsule (250 mcg total) by mouth 2 (two) times daily. 60 capsule 6  . latanoprost (XALATAN) 0.005 % ophthalmic solution Place 1 drop into both eyes at bedtime.   3  . levETIRAcetam (KEPPRA XR) 500 MG 24 hr tablet Take 1 tablet (500 mg total) by mouth daily. 90 tablet 3  . loratadine (CLARITIN) 10 MG tablet Take 10 mg by mouth daily.    . memantine (NAMENDA) 10 MG tablet TAKE 1 TABLET BY MOUTH TWICE A DAY 180 tablet 1  . metoprolol succinate (TOPROL XL) 25 MG 24 hr tablet Take 1 tablet (25 mg total) by mouth daily. 30 tablet 11  . Multiple Vitamins-Minerals (CENTRUM SILVER ADULT 50+ PO) Take 1 capsule by mouth daily.    . rivaroxaban (XARELTO) 20 MG TABS tablet Take 1 tablet (20 mg total) by mouth daily with supper. 90 tablet 1   No current facility-administered medications for this encounter.     No Known Allergies  Social History   Socioeconomic History  . Marital status: Married    Spouse name: [name]  . Number of children: 5  . Years of education: College  . Highest education level: Not on file  Occupational History  . Not on file  Social Needs  . Financial resource strain: Not on file  . Food insecurity:    Worry: Not on file    Inability: Not on file  . Transportation needs:    Medical: Not on file    Non-medical: Not on file  Tobacco Use  . Smoking status: Never Smoker  . Smokeless tobacco: Never Used  Substance and Sexual Activity  . Alcohol use: Not Currently    Alcohol/week: 0.0 oz    Frequency: Never  . Drug use: Never  . Sexual activity: Not on file  Lifestyle  . Physical activity:    Days per week: Not on file    Minutes per session: Not on file  . Stress: Not on file  Relationships  . Social connections:    Talks on phone: Not on file    Gets together: Not on file    Attends religious service: Not on file    Active member of club or organization: Not on file    Attends meetings of clubs or organizations: Not on file    Relationship status: Not on  file  . Intimate partner violence:    Fear of current or ex partner: Not on file    Emotionally abused: Not on file    Physically abused: Not on file    Forced sexual activity: Not on file  Other Topics Concern  . Not on file  Social History Narrative   Patient is married [name]) and lives at home with wife and two children.   Patient has five adult children.   Patient is retired.   Patient has a college education.   Patient is right-handed.   Patient  drinks two cups of coffee daily, 1/2 can of soda daily and tea- 2-3 times per week.    Family History  Problem Relation Age of Onset  . Heart attack Father   . Cancer - Lung Sister   . Thyroid disease Child     ROS- All systems are reviewed and negative except as per the HPI above  Physical Exam: Vitals:   09/02/17 1124  BP: (!) 96/58  Pulse: 79  Weight: 204 lb 4.8 oz (92.7 kg)  Height: 5\' 10"  (1.778 m)   Wt Readings from Last 3 Encounters:  09/02/17 204 lb 4.8 oz (92.7 kg)  08/26/17 204 lb 4.8 oz (92.7 kg)  08/23/17 206 lb 6.4 oz (93.6 kg)    Labs: Lab Results  Component Value Date   NA 139 09/02/2017   K 4.2 09/02/2017   CL 101 09/02/2017   CO2 29 09/02/2017   GLUCOSE 135 (H) 09/02/2017   BUN 16 09/02/2017   CREATININE 0.95 09/02/2017   CALCIUM 9.0 09/02/2017   MG 2.1 09/02/2017   Lab Results  Component Value Date   INR 1.40 03/24/2017   Lab Results  Component Value Date   CHOL 127 10/24/2014   HDL 42 10/24/2014   LDLCALC 62 10/24/2014   TRIG 113 10/24/2014     GEN- The patient is well appearing, alert and oriented x 3 today.   Head- normocephalic, atraumatic Eyes-  Sclera clear, conjunctiva pink Ears- hearing intact Oropharynx- clear Neck- supple, no JVP Lymph- no cervical lymphadenopathy Lungs- Clear to ausculation bilaterally, normal work of breathing Heart- Regular rate and rhythm, no murmurs, rubs or gallops, PMI not laterally displaced GI- soft, NT, ND, + BS Extremities- no clubbing,  cyanosis, or edema MS- no significant deformity or atrophy Skin- no rash or lesion Psych- euthymic mood, full affect Neuro- strength and sensation are intact  EKG-atrial sensed /v paced at 79 bpm, qtc 552 ms( inaccurate  2/2 to paced rhythm, in line with qtc at discharge at 561 ms) Epic records reviewed    Assessment and Plan: 1. Afib Recent load of Tikosyn General precautions re Tikosyn discussed Continue metoprolol ER 25 mg daily Continue xarelto without missed doses bmet/mag  2. PPM Per Dr. Bradly Chris, NP  F/u with Luetta Nutting 8/29  Geroge Baseman. Rayane Gallardo, [facility] [address] [place], [place] [address] [phone]

## 2017-09-19 ENCOUNTER — Ambulatory Visit (INDEPENDENT_AMBULATORY_CARE_PROVIDER_SITE_OTHER): Payer: Medicare Other

## 2017-09-19 ENCOUNTER — Ambulatory Visit (INDEPENDENT_AMBULATORY_CARE_PROVIDER_SITE_OTHER): Payer: Medicare Other | Admitting: *Deleted

## 2017-09-19 DIAGNOSIS — I442 Atrioventricular block, complete: Secondary | ICD-10-CM

## 2017-09-19 DIAGNOSIS — Z950 Presence of cardiac pacemaker: Secondary | ICD-10-CM

## 2017-09-19 DIAGNOSIS — I5022 Chronic systolic (congestive) heart failure: Secondary | ICD-10-CM

## 2017-09-20 ENCOUNTER — Encounter: Payer: Self-pay | Admitting: Cardiology

## 2017-09-20 NOTE — Progress Notes (Signed)
Remote pacemaker transmission.   

## 2017-09-20 NOTE — Progress Notes (Signed)
EPIC Encounter for ICM Monitoring  Patient Name: Thomas Long is a 84 y.o. male Date: 09/20/2017 Primary Care Physican: Lajean Manes, MD Primary Cardiologist:Kelly Electrophysiologist: Allred Dry Weight:unknown Bi-V Pacing: 98%   AT/AF Burden: 65%     Spoke with wife. Heart Failure questions reviewed, pt asymptomatic.   Thoracic impedance normal.  No diuretic.  Recommendations: No changes.  Encouraged to call for fluid symptoms.  Follow-up plan: ICM clinic phone appointment on 11/07/2017.   Office appointment scheduled 10/06/2017 with Chanetta Marshall, NP.    Copy of ICM check sent to Dr. Rayann Heman.   3 month ICM trend: 09/19/2017     AT/AF       1 Year ICM trend:       Rosalene Billings, RN 09/20/2017 12:38 PM

## 2017-10-04 NOTE — Progress Notes (Signed)
Electrophysiology Office Note Date: 10/06/2017  ID:  Thomas Long, DOB 1931/05/22, MRN [id number]  PCP: Lajean Manes, MD Primary Cardiologist: Claiborne Billings Electrophysiologist: Allred  CC: Pacemaker follow-up  Thomas Long is a 84 y.o. male seen today for Dr Rayann Heman.  He presents today for routine electrophysiology followup.  Since last being seen in our clinic, the patient reports doing reasonably well.  He has had somewhat improved energy level on Tikosyn.  He and his wife are enjoying their new granddaughter.  Memory is still somewhat an issue.  He denies chest pain, palpitations, dyspnea, PND, orthopnea, nausea, vomiting, dizziness, syncope, edema, weight gain, or early satiety.  Device History: STJ CRTP implanted 2016 for complete heart block, CHF   Past Medical History:  Diagnosis Date  . CAD (coronary artery disease)    a. LHC 10/2014: nonobstructive disease, 30% LAD with bridging up to 50%.  . CHF (congestive heart failure) ([place]) 2016   "this was the reason for his pacemaker" (08/23/2017)  . Chronic pain   . Complete heart block ([place]) 11/07/2014   a. s/p [facility] Quadra Allure MP RF model [id number] (serial number [id number]) biventricular pacemaker 10/2014.  Marland Kitchen Dementia    "memory lapses q now and then; dx'd as delayed memory" (08/23/2017)  . Depression   . Facial cellulitis 02/19/2009   "related to [facility] & having skin cancer zapped; took him off the Embrel"  . History of hiatal hernia 1980s  . Hyperlipidemia   . Hypertension   . NICM (nonischemic cardiomyopathy) ([place])    a. 10/2014: EF 45%. (Prev 40-45% by echo 08/2014).  . Orthostatic hypotension   . Presence of permanent cardiac pacemaker   . Rheumatoid arthritis ([place])    "hands mainly; knees"  (08/23/2017)  . Seizures ([place]) 2008   "vs stroke; never determined which it was; on sz RX since"  (08/23/2017)  . Sleep apnea 2008   "gone since losing weight" (08/23/2017)  . Stroke [facility]) 2008   "vs seizure; never determined  which it was; on sz RX since"   (08/23/2017)  . TIA (transient ischemic attack) 2008   "vs seizure; never determined which it was; on sz RX since"   (08/23/2017)   Past Surgical History:  Procedure Laterality Date  . APPENDECTOMY  ~ 1988  . CARDIAC CATHETERIZATION  04/01/09   which showed low normal EF at 50% with question of underlying borderline area of minimal inferoapical hypercontractility. He had mild coronary obstructive disease with coronary calcification and segmental 20% narrowing in the LAD proximally, 20% in the mid LAD with muscle bridging. There was calcification at the ostium of the RCA, and 20 to 30%narrowing of the proximal to mid RCA with 30% distal n  . CARDIAC CATHETERIZATION N/A 11/07/2014   Procedure: Left Heart Cath and Coronary Angiography/ temp wire;  Surgeon: [name], MD;  Location: [place] [facility];  Service: Cardiovascular;  Laterality: N/A;  . CARDIOVERSION N/A 07/19/2017   Procedure: CARDIOVERSION;  Surgeon: Acie Fredrickson Wonda Cheng, MD;  Location: [place];  Service: Cardiovascular;  Laterality: N/A;  . CATARACT EXTRACTION W/ INTRAOCULAR LENS  IMPLANT, BILATERAL Bilateral early 2000's  . CHOLECYSTECTOMY OPEN  1980's  . EP IMPLANTABLE DEVICE N/A 11/08/2014   a. STJ CRTP implanted by Dr Rayann Heman  . EP IMPLANTABLE DEVICE N/A 11/10/2014   RA lead revision Dr Rayann Heman  . EXPLORATORY LAPAROTOMY  1990's   "put intestines back in & added mesh"  . FALSE ANEURYSM REPAIR Right 11/15/2014  Procedure: REPAIR OF RIGHT FEMORAL FALSE ANEURYSM ;  Surgeon: Rosetta Posner, MD;  Location: [place];  Service: Vascular;  Laterality: Right;  . HERNIA REPAIR  1990s  . KNEE CARTILAGE SURGERY Left 1990's   "opened it up"  . TONSILLECTOMY      Current Outpatient Medications  Medication Sig Dispense Refill  . acetaminophen (TYLENOL) 500 MG tablet Take 500 mg by mouth every 6 (six) hours as needed for moderate pain or headache.    Marland Kitchen atorvastatin (LIPITOR) 10 MG tablet Take 10 mg by mouth  daily.    Marland Kitchen dofetilide (TIKOSYN) 250 MCG capsule Take 1 capsule (250 mcg total) by mouth 2 (two) times daily. 60 capsule 6  . latanoprost (XALATAN) 0.005 % ophthalmic solution Place 1 drop into both eyes at bedtime.   3  . levETIRAcetam (KEPPRA XR) 500 MG 24 hr tablet Take 1 tablet (500 mg total) by mouth daily. 90 tablet 3  . loratadine (CLARITIN) 10 MG tablet Take 10 mg by mouth daily.    . memantine (NAMENDA) 10 MG tablet TAKE 1 TABLET BY MOUTH TWICE A DAY 180 tablet 1  . metoprolol succinate (TOPROL XL) 25 MG 24 hr tablet Take 1 tablet (25 mg total) by mouth daily. 30 tablet 11  . Multiple Vitamins-Minerals (CENTRUM SILVER ADULT 50+ PO) Take 1 capsule by mouth daily.    . rivaroxaban (XARELTO) 20 MG TABS tablet Take 1 tablet (20 mg total) by mouth daily with supper. 90 tablet 1   No current facility-administered medications for this visit.     Allergies:   Patient has no known allergies.   Social History: Social History   Socioeconomic History  . Marital status: Married    Spouse name: [name]  . Number of children: 5  . Years of education: College  . Highest education level: Not on file  Occupational History  . Not on file  Social Needs  . Financial resource strain: Not on file  . Food insecurity:    Worry: Not on file    Inability: Not on file  . Transportation needs:    Medical: Not on file    Non-medical: Not on file  Tobacco Use  . Smoking status: Never Smoker  . Smokeless tobacco: Never Used  Substance and Sexual Activity  . Alcohol use: Not Currently    Alcohol/week: 0.0 standard drinks    Frequency: Never  . Drug use: Never  . Sexual activity: Not on file  Lifestyle  . Physical activity:    Days per week: Not on file    Minutes per session: Not on file  . Stress: Not on file  Relationships  . Social connections:    Talks on phone: Not on file    Gets together: Not on file    Attends religious service: Not on file    Active member of club or  organization: Not on file    Attends meetings of clubs or organizations: Not on file    Relationship status: Not on file  . Intimate partner violence:    Fear of current or ex partner: Not on file    Emotionally abused: Not on file    Physically abused: Not on file    Forced sexual activity: Not on file  Other Topics Concern  . Not on file  Social History Narrative   Patient is married [name]) and lives at home with wife and two children.   Patient has five adult children.   Patient is  retired.   Patient has a college education.   Patient is right-handed.   Patient drinks two cups of coffee daily, 1/2 can of soda daily and tea- 2-3 times per week.    Family History: Family History  Problem Relation Age of Onset  . Heart attack Father   . Cancer - Lung Sister   . Thyroid disease Child      Review of Systems: All other systems reviewed and are otherwise negative except as noted above.   Physical Exam: VS:  BP 120/78   Pulse 92   Ht 5\' 10"  (1.778 m)   Wt 205 lb 12.8 oz (93.4 kg)   SpO2 93%   BMI 29.53 kg/m  , BMI Body mass index is 29.53 kg/m.  GEN- The patient is elderly appearing, alert and oriented x 3 today.   HEENT: normocephalic, atraumatic; sclera clear, conjunctiva pink; hearing intact; oropharynx clear; neck supple  Lungs- Clear to ausculation bilaterally, normal work of breathing.  No wheezes, rales, rhonchi Heart- Regular rate and rhythm (paced) GI- soft, non-tender, non-distended, bowel sounds present  Extremities- no clubbing, cyanosis, or edema  MS- no significant deformity or atrophy Skin- warm and dry, no rash or lesion; PPM pocket well healed Psych- euthymic mood, full affect Neuro- strength and sensation are intact  PPM Interrogation- reviewed in detail today,  See PACEART report  EKG:  EKG is ordered today. The ekg ordered today shows sinus rhythm with V pacing, QTc stable when accounting for paced QRS   Recent Labs: 04/11/2017: ALT 14; BNP  45.1; TSH 1.210 07/07/2017: Hemoglobin 12.1; Platelets 323 09/02/2017: BUN 16; Creatinine, Ser 0.95; Magnesium 2.1; Potassium 4.2; Sodium 139   Wt Readings from Last 3 Encounters:  10/06/17 205 lb 12.8 oz (93.4 kg)  09/02/17 204 lb 4.8 oz (92.7 kg)  08/26/17 204 lb 4.8 oz (92.7 kg)     Assessment and Plan:  1.  Complete heart block Normal PPM function See Pace Art report No changes today  2.  Persistent atrial fibrillation Maintaining SR on Tikosyn QTc stable today (reviewed with Dr Rayann Heman) Recent BMET, Mg reviewed Continue Xarelto for CHADS2VASC of 5  3.  Hypotension Stable No change required today  4.  Chronic systolic heart failure Stable No change required today    Current medicines are reviewed at length with the patient today.   The patient does not have concerns regarding his medicines.  The following changes were made today:  none  Labs/ tests ordered today include: none Orders Placed This Encounter  Procedures  . EKG 12-Lead     Disposition:   Follow up with Delilah Shan, Dr Rayann Heman in 3 months     Signed, Chanetta Marshall, NP 10/06/2017 8:24 AM  [address] [place] [address] [phone] (office) [phone] (fax)

## 2017-10-06 ENCOUNTER — Ambulatory Visit: Payer: Medicare Other | Admitting: Nurse Practitioner

## 2017-10-06 ENCOUNTER — Encounter: Payer: Self-pay | Admitting: Nurse Practitioner

## 2017-10-06 VITALS — BP 120/78 | HR 92 | Ht 70.0 in | Wt 205.8 lb

## 2017-10-06 DIAGNOSIS — I5022 Chronic systolic (congestive) heart failure: Secondary | ICD-10-CM | POA: Diagnosis not present

## 2017-10-06 DIAGNOSIS — I442 Atrioventricular block, complete: Secondary | ICD-10-CM | POA: Diagnosis not present

## 2017-10-06 DIAGNOSIS — I481 Persistent atrial fibrillation: Secondary | ICD-10-CM | POA: Diagnosis not present

## 2017-10-06 DIAGNOSIS — I951 Orthostatic hypotension: Secondary | ICD-10-CM | POA: Diagnosis not present

## 2017-10-06 DIAGNOSIS — I4819 Other persistent atrial fibrillation: Secondary | ICD-10-CM

## 2017-10-06 NOTE — Patient Instructions (Addendum)
Medication Instructions:   Your physician recommends that you continue on your current medications as directed. Please refer to the Current Medication list given to you today.   If you need a refill on your cardiac medications before your next appointment, please call your pharmacy.  Labwork: NONE ORDERED  TODAY    Testing/Procedures: NONE ORDERED  TODAY    Follow-Up: DR Rayann Heman IN 3 MONTHS   Remote monitoring is used to monitor your Pacemaker of ICD from home. This monitoring reduces the number of office visits required to check your device to one time per year. It allows [place] to keep an eye on the functioning of your device to ensure it is working properly. You are scheduled for a device check from home on . 12-19-17..You may send your transmission at any time that day. If you have a wireless device, the transmission will be sent automatically. After your physician reviews your transmission, you will receive a postcard with your next transmission date.   Any Other Special Instructions Will Be Listed Below (If Applicable).

## 2017-10-07 ENCOUNTER — Telehealth: Payer: Self-pay | Admitting: Nurse Practitioner

## 2017-10-07 MED ORDER — RIVAROXABAN 20 MG PO TABS: 20.0000 mg | ORAL_TABLET | Freq: Every day | ORAL | 2 refills | Status: DC

## 2017-10-07 NOTE — Telephone Encounter (Signed)
Pt last saw Chanetta Marshall, NP on 10/06/17, last labs 09/02/17 Creat 0.95, age 84, weight 93.4kg, CrCl 76.47, based on CrCl pt is on appropriate dosage of Xarelto 20mg  QD.  Will refill rx.

## 2017-10-07 NOTE — Telephone Encounter (Signed)
New Message    *STAT* If patient is at the pharmacy, call can be transferred to refill team.   1. Which medications need to be refilled? (please list name of each medication and dose if known) rivaroxaban (XARELTO) 20 MG TABS tablet  2. Which pharmacy/location (including street and city if local pharmacy) is medication to be sent to? [facility] [place] [place] AT Portal to Registered [employer] Sites  3. Do they need a 30 day or 90 day supply? [place]

## 2017-10-14 LAB — CUP PACEART REMOTE DEVICE CHECK
Battery Remaining Longevity: 62 mo
Battery Remaining Percentage: 89 %
Battery Voltage: 2.95 V
Brady Statistic AP VP Percent: 1 %
Brady Statistic AP VS Percent: 1 %
Brady Statistic AS VP Percent: 99 %
Brady Statistic AS VS Percent: 1 %
Brady Statistic RA Percent Paced: 1 %
Date Time Interrogation Session: 20190812150144
Implantable Lead Implant Date: 20160930
Implantable Lead Implant Date: 20160930
Implantable Lead Implant Date: 20160930
Implantable Lead Location: 753858
Implantable Lead Location: 753859
Implantable Lead Location: 753860
Implantable Pulse Generator Implant Date: 20160930
Lead Channel Impedance Value: 360 Ohm
Lead Channel Impedance Value: 460 Ohm
Lead Channel Impedance Value: 660 Ohm
Lead Channel Pacing Threshold Amplitude: 0.5 V
Lead Channel Pacing Threshold Amplitude: 0.625 V
Lead Channel Pacing Threshold Amplitude: 1.75 V
Lead Channel Pacing Threshold Pulse Width: 0.5 ms
Lead Channel Pacing Threshold Pulse Width: 0.5 ms
Lead Channel Pacing Threshold Pulse Width: 0.8 ms
Lead Channel Sensing Intrinsic Amplitude: 2.3 mV
Lead Channel Sensing Intrinsic Amplitude: 6.1 mV
Lead Channel Setting Pacing Amplitude: 2 V
Lead Channel Setting Pacing Amplitude: 2 V
Lead Channel Setting Pacing Amplitude: 2.75 V
Lead Channel Setting Pacing Pulse Width: 0.5 ms
Lead Channel Setting Pacing Pulse Width: 0.8 ms
Lead Channel Setting Sensing Sensitivity: 5 mV
Pulse Gen Model: 3262
Pulse Gen Serial Number: 7798436

## 2017-11-07 ENCOUNTER — Telehealth: Payer: Self-pay | Admitting: Cardiology

## 2017-11-07 ENCOUNTER — Ambulatory Visit (INDEPENDENT_AMBULATORY_CARE_PROVIDER_SITE_OTHER): Payer: Medicare Other

## 2017-11-07 DIAGNOSIS — I5022 Chronic systolic (congestive) heart failure: Secondary | ICD-10-CM

## 2017-11-07 DIAGNOSIS — Z950 Presence of cardiac pacemaker: Secondary | ICD-10-CM | POA: Diagnosis not present

## 2017-11-07 NOTE — Telephone Encounter (Signed)
LMOVM reminding pt to send remote transmission.   

## 2017-11-08 ENCOUNTER — Telehealth: Payer: Self-pay

## 2017-11-08 NOTE — Progress Notes (Signed)
EPIC Encounter for ICM Monitoring  Patient Name: Thomas Long is a 84 y.o. male Date: 11/08/2017 Primary Care Physican: Lajean Manes, MD Primary Cardiologist:Kelly Electrophysiologist: Allred Dry Weight:unknown Bi-V Pacing: >99%     AT/AF Burden: >1%     Attempted call to patient and unable to reach.     Thoracic impedance normal.  No diuretic.  Recommendations: Unable to reach.  Follow-up plan: ICM clinic phone appointment on 12/19/2017.   Office appointment scheduled 01/09/2018 with Dr. Rayann Heman.    Copy of ICM check sent to Dr. Rayann Heman.   3 month ICM trend: 11/07/2017    1 Year ICM trend:       Rosalene Billings, RN 11/08/2017 1:25 PM

## 2017-11-08 NOTE — Telephone Encounter (Signed)
Remote ICM transmission received.  Attempted call to patient and no answer   

## 2017-12-19 ENCOUNTER — Ambulatory Visit (INDEPENDENT_AMBULATORY_CARE_PROVIDER_SITE_OTHER): Payer: Medicare Other | Admitting: *Deleted

## 2017-12-19 ENCOUNTER — Ambulatory Visit (INDEPENDENT_AMBULATORY_CARE_PROVIDER_SITE_OTHER): Payer: Medicare Other

## 2017-12-19 DIAGNOSIS — I5022 Chronic systolic (congestive) heart failure: Secondary | ICD-10-CM

## 2017-12-19 DIAGNOSIS — I442 Atrioventricular block, complete: Secondary | ICD-10-CM

## 2017-12-19 DIAGNOSIS — Z950 Presence of cardiac pacemaker: Secondary | ICD-10-CM | POA: Diagnosis not present

## 2017-12-20 NOTE — Progress Notes (Signed)
EPIC Encounter for ICM Monitoring  Patient Name: Thomas Long is a 84 y.o. male Date: 12/20/2017 Primary Care Physican: Lajean Manes, MD Primary [place] Electrophysiologist: Allred Bi-V Pacing: >99%         Spoke with wife.  Heart Failure questions reviewed, pt asymptomatic.   Thoracic impedance normal.   No diuretic.  Recommendations: No changes.   Encouraged to call for fluid symptoms.  Follow-up plan: ICM clinic phone appointment on 02/09/2018.   Office appointment scheduled 01/09/2018 with Dr. Rayann Heman.    Copy of ICM check sent to Dr. Rayann Heman.   3 month ICM trend: 12/19/2017    1 Year ICM trend:       Rosalene Billings, RN 12/20/2017 10:49 AM

## 2017-12-20 NOTE — Progress Notes (Signed)
Remote pacemaker transmission.   

## 2018-01-09 ENCOUNTER — Encounter: Payer: Self-pay | Admitting: Internal Medicine

## 2018-01-09 ENCOUNTER — Ambulatory Visit: Payer: Medicare Other | Admitting: Internal Medicine

## 2018-01-09 VITALS — BP 102/62 | HR 98 | Ht 70.0 in | Wt 201.4 lb

## 2018-01-09 DIAGNOSIS — I442 Atrioventricular block, complete: Secondary | ICD-10-CM | POA: Diagnosis not present

## 2018-01-09 DIAGNOSIS — I5022 Chronic systolic (congestive) heart failure: Secondary | ICD-10-CM

## 2018-01-09 DIAGNOSIS — Z950 Presence of cardiac pacemaker: Secondary | ICD-10-CM | POA: Diagnosis not present

## 2018-01-09 DIAGNOSIS — I4819 Other persistent atrial fibrillation: Secondary | ICD-10-CM

## 2018-01-09 DIAGNOSIS — Z79899 Other long term (current) drug therapy: Secondary | ICD-10-CM

## 2018-01-09 LAB — BASIC METABOLIC PANEL
BUN/Creatinine Ratio: 19 (ref 10–24)
BUN: 21 mg/dL (ref 8–27)
CO2: 24 mmol/L (ref 20–29)
Calcium: 9.2 mg/dL (ref 8.6–10.2)
Chloride: 98 mmol/L (ref 96–106)
Creatinine, Ser: 1.08 mg/dL (ref 0.76–1.27)
GFR calc Af Amer: 72 mL/min/{1.73_m2} (ref >59)
GFR calc non Af Amer: 63 mL/min/{1.73_m2} (ref >59)
Glucose: 108 mg/dL — ABNORMAL HIGH (ref 65–99)
Potassium: 4.7 mmol/L (ref 3.5–5.2)
Sodium: 138 mmol/L (ref 134–144)

## 2018-01-09 LAB — MAGNESIUM: Magnesium: 2.1 mg/dL (ref 1.6–2.3)

## 2018-01-09 NOTE — Patient Instructions (Signed)
Medication Instructions:  Your physician recommends that you continue on your current medications as directed. Please refer to the Current Medication list given to you today.  * If you need a refill on your cardiac medications before your next appointment, please call your pharmacy.   Labwork: Today: BMP & Magnesium level *We will only notify you of abnormal results, otherwise continue current treatment plan.  Testing/Procedures: None ordered  Follow-Up: Your physician wants you to follow-up in: 6 months with Chanetta Marshall, NP.  You will receive a reminder letter in the mail two months in advance. If you don't receive a letter, please call our office to schedule the follow-up appointment.   Thank you for choosing [facility]!!   [phone]

## 2018-01-09 NOTE — Addendum Note (Signed)
Addended by: Stanton Kidney on: 01/09/2018 10:24 AM   Modules accepted: Orders

## 2018-01-09 NOTE — Progress Notes (Signed)
PCP: Lajean Manes, MD Primary Cardiologist: Dr Claiborne Billings Primary EP:  Dr Rayann Heman  Thomas Long is a 84 y.o. male who presents today for routine electrophysiology followup.  Since last being seen in our clinic, the patient reports doing very well.  Today, he denies symptoms of palpitations, chest pain, shortness of breath,  lower extremity edema, dizziness, presyncope, or syncope.  He has a sty over his R eye which is his only complaint today.  This has already been addressed by Opthalmology.  The patient is otherwise without complaint today.   Past Medical History:  Diagnosis Date  . CAD (coronary artery disease)    a. LHC 10/2014: nonobstructive disease, 30% LAD with bridging up to 50%.  . CHF (congestive heart failure) ([place]) 2016   "this was the reason for his pacemaker" (08/23/2017)  . Chronic pain   . Complete heart block ([place]) 11/07/2014   a. s/p [facility] Quadra Allure MP RF model [phone] (serial number [id number]) biventricular pacemaker 10/2014.  Marland Kitchen Dementia ([place])    "memory lapses q now and then; dx'd as delayed memory" (08/23/2017)  . Depression   . Facial cellulitis 02/19/2009   "related to [facility] & having skin cancer zapped; took him off the Embrel"  . History of hiatal hernia 1980s  . Hyperlipidemia   . Hypertension   . NICM (nonischemic cardiomyopathy) ([place])    a. 10/2014: EF 45%. (Prev 40-45% by echo 08/2014).  . Orthostatic hypotension   . Presence of permanent cardiac pacemaker   . Rheumatoid arthritis ([place])    "hands mainly; knees"  (08/23/2017)  . Seizures ([place]) 2008   "vs stroke; never determined which it was; on sz RX since"  (08/23/2017)  . Sleep apnea 2008   "gone since losing weight" (08/23/2017)  . Stroke [facility] 2008   "vs seizure; never determined which it was; on sz RX since"   (08/23/2017)  . TIA (transient ischemic attack) 2008   "vs seizure; never determined which it was; on sz RX since"   (08/23/2017)   Past Surgical History:  Procedure Laterality Date    . APPENDECTOMY  ~ 1988  . CARDIAC CATHETERIZATION  04/01/09   which showed low normal EF at 50% with question of underlying borderline area of minimal inferoapical hypercontractility. He had mild coronary obstructive disease with coronary calcification and segmental 20% narrowing in the LAD proximally, 20% in the mid LAD with muscle bridging. There was calcification at the ostium of the RCA, and 20 to 30%narrowing of the proximal to mid RCA with 30% distal n  . CARDIAC CATHETERIZATION N/A 11/07/2014   Procedure: Left Heart Cath and Coronary Angiography/ temp wire;  Surgeon: [name], MD;  Location: [place] [facility];  Service: Cardiovascular;  Laterality: N/A;  . CARDIOVERSION N/A 07/19/2017   Procedure: CARDIOVERSION;  Surgeon: Acie Fredrickson Wonda Cheng, MD;  Location: [place];  Service: Cardiovascular;  Laterality: N/A;  . CATARACT EXTRACTION W/ INTRAOCULAR LENS  IMPLANT, BILATERAL Bilateral early 2000's  . CHOLECYSTECTOMY OPEN  1980's  . EP IMPLANTABLE DEVICE N/A 11/08/2014   a. STJ CRTP implanted by Dr Rayann Heman  . EP IMPLANTABLE DEVICE N/A 11/10/2014   RA lead revision Dr Rayann Heman  . EXPLORATORY LAPAROTOMY  1990's   "put intestines back in & added mesh"  . FALSE ANEURYSM REPAIR Right 11/15/2014   Procedure: REPAIR OF RIGHT FEMORAL FALSE ANEURYSM ;  Surgeon: Rosetta Posner, MD;  Location: [place];  Service: Vascular;  Laterality: Right;  .  HERNIA REPAIR  1990s  . KNEE CARTILAGE SURGERY Left 1990's   "opened it up"  . TONSILLECTOMY      ROS- all systems are reviewed and negative except as per HPI above  Current Outpatient Medications  Medication Sig Dispense Refill  . acetaminophen (TYLENOL) 500 MG tablet Take 500 mg by mouth every 6 (six) hours as needed for moderate pain or headache.    Marland Kitchen atorvastatin (LIPITOR) 10 MG tablet Take 10 mg by mouth daily.    Marland Kitchen dofetilide (TIKOSYN) 250 MCG capsule Take 1 capsule (250 mcg total) by mouth 2 (two) times daily. 60 capsule 6  . latanoprost (XALATAN)  0.005 % ophthalmic solution Place 1 drop into both eyes at bedtime.   3  . levETIRAcetam (KEPPRA XR) 500 MG 24 hr tablet Take 1 tablet (500 mg total) by mouth daily. 90 tablet 3  . loratadine (CLARITIN) 10 MG tablet Take 10 mg by mouth daily.    . memantine (NAMENDA) 10 MG tablet TAKE 1 TABLET BY MOUTH TWICE A DAY 180 tablet 1  . metoprolol succinate (TOPROL XL) 25 MG 24 hr tablet Take 1 tablet (25 mg total) by mouth daily. 30 tablet 11  . Multiple Vitamins-Minerals (CENTRUM SILVER ADULT 50+ PO) Take 1 capsule by mouth daily.    . rivaroxaban (XARELTO) 20 MG TABS tablet Take 1 tablet (20 mg total) by mouth daily with supper. 90 tablet 2   No current facility-administered medications for this visit.     Physical Exam: Vitals:   01/09/18 1003  BP: 102/62  Pulse: 98  SpO2: 96%  Weight: 201 lb 6.4 oz (91.4 kg)  Height: 5\' 10"  (1.778 m)    GEN- The patient is well appearing, alert and oriented x 3 today.   Head- normocephalic, atraumatic Eyes-  Sclera clear, conjunctiva pink, R eye sty noted Ears- hearing intact Oropharynx- clear Lungs- Clear to ausculation bilaterally, normal work of breathing Chest- pacemaker pocket is well healed Heart- Regular rate and rhythm, no murmurs, rubs or gallops, PMI not laterally displaced GI- soft, NT, ND, + BS Extremities- no clubbing, cyanosis, or edema  Pacemaker interrogation- reviewed in detail today,  See PACEART report  ekg tracing ordered today is personally reviewed and shows sinus with BiV pacing, QTc 533 msec  Assessment and Plan:  1. Symptomati complete heart block Normal BiV pacemaker function See Pace Art report No changes today Followed in ICM clinic with Sharman Cheek  2. Paroxysmal atrial fibrillation AF burden is <1% on tikosyn On xarelto for chads2vasc score of 5. Bmet, mg today  3. Chronic systolic dysfunction EF has improved with CRT Followed by Dr Delaine Lame Return to see EP NP every 6 months Follow-up with Dr  Claiborne Billings as scheduled  [name] MD, [facility] 01/09/2018 10:12 AM

## 2018-01-09 NOTE — Addendum Note (Signed)
Addended by: Eulis Foster on: 01/09/2018 10:34 AM   Modules accepted: Orders

## 2018-01-18 ENCOUNTER — Ambulatory Visit: Payer: Medicare Other | Admitting: Adult Health

## 2018-01-18 ENCOUNTER — Encounter: Payer: Self-pay | Admitting: Adult Health

## 2018-01-18 VITALS — BP 103/66 | HR 100 | Ht 70.0 in | Wt 205.0 lb

## 2018-01-18 DIAGNOSIS — R569 Unspecified convulsions: Secondary | ICD-10-CM | POA: Diagnosis not present

## 2018-01-18 DIAGNOSIS — R413 Other amnesia: Secondary | ICD-10-CM

## 2018-01-18 MED ORDER — MEMANTINE HCL 10 MG PO TABS: 10.0000 mg | ORAL_TABLET | Freq: Two times a day (BID) | ORAL | 3 refills | Status: DC

## 2018-01-18 MED ORDER — LEVETIRACETAM ER 500 MG PO TB24: 500.0000 mg | ORAL_TABLET | Freq: Every day | ORAL | 3 refills | Status: DC

## 2018-01-18 NOTE — Progress Notes (Signed)
PATIENT: Thomas Long DOB: [birth year]  REASON FOR VISIT: follow up HISTORY FROM: patient  HISTORY OF PRESENT ILLNESS: Today 01/18/18:  Thomas Long is an 84 year old male with a history of seizures, stroke and memory disturbance.  He returns today for follow-up.  He states that has not had any seizure events.  He remains on Keppra.  His wife feels that his memory has gotten slightly worse.  She notices that he seems more confused when they have been busy throughout the day.  She states that he typically tolerates 1 activity but anything much more than that makes him very tired which then affects his memory.  He continues to complete all ADLs independently.  He does not operate a motor vehicle.  His wife manages his medications, appointments and finances.  He denies any trouble sleeping.  Denies hallucinations.  He continues on Namenda 10 mg twice a day.  He returns today for follow-up.  HISTORY 07/11/17:  Thomas Long is an 84 year old male with a history of seizures, stroke and memory disturbance.  He returns today for follow-up.  He remains on Keppra extended release 500 mg daily.  He reports that he tolerates this well.  He denies any seizure events.  His wife reports that his memory is up and down.  She feels that this is related to his blood pressure.  He remains on Namenda.  He lives at home with his wife.  He is able to complete all ADLs independently.  He no longer operates a motor vehicle  His wife  manages hismedications.  Reports that he is up and down most the night.  But he does take several naps throughout the day.  Denies any changes with his mood or behavior.  Reports poor appetite.  He remains on Xarelto for stroke prevention.  Blood pressure within normal range today.  He continues to follow with his cardiologist.  He reports that he has been having some issues with his blood pressure as well as pacemaker.  He is following with Dr. Rayann Heman.  He returns today for an  evaluation.   REVIEW OF SYSTEMS: Out of a complete 14 system review of symptoms, the patient complains only of the following symptoms, and all other reviewed systems are negative.  Joint pain   ALLERGIES: No Known Allergies  HOME MEDICATIONS: Outpatient Medications Prior to Visit  Medication Sig Dispense Refill  . acetaminophen (TYLENOL) 500 MG tablet Take 500 mg by mouth every 6 (six) hours as needed for moderate pain or headache.    Marland Kitchen atorvastatin (LIPITOR) 10 MG tablet Take 10 mg by mouth daily.    Marland Kitchen dofetilide (TIKOSYN) 250 MCG capsule Take 1 capsule (250 mcg total) by mouth 2 (two) times daily. 60 capsule 6  . latanoprost (XALATAN) 0.005 % ophthalmic solution Place 1 drop into both eyes at bedtime.   3  . levETIRAcetam (KEPPRA XR) 500 MG 24 hr tablet Take 1 tablet (500 mg total) by mouth daily. 90 tablet 3  . loratadine (CLARITIN) 10 MG tablet Take 10 mg by mouth daily.    . memantine (NAMENDA) 10 MG tablet TAKE 1 TABLET BY MOUTH TWICE A DAY 180 tablet 1  . metoprolol succinate (TOPROL XL) 25 MG 24 hr tablet Take 1 tablet (25 mg total) by mouth daily. 30 tablet 11  . Multiple Vitamins-Minerals (CENTRUM SILVER ADULT 50+ PO) Take 1 capsule by mouth daily.    . rivaroxaban (XARELTO) 20 MG TABS tablet Take 1 tablet (20 mg total)  by mouth daily with supper. 90 tablet 2   No facility-administered medications prior to visit.     PAST MEDICAL HISTORY: Past Medical History:  Diagnosis Date  . CAD (coronary artery disease)    a. LHC 10/2014: nonobstructive disease, 30% LAD with bridging up to 50%.  . CHF (congestive heart failure) ([place]) 2016   "this was the reason for his pacemaker" (08/23/2017)  . Chronic pain   . Complete heart block ([place]) 11/07/2014   a. s/p [facility] Quadra Allure MP RF model [id number] (serial number [id number]) biventricular pacemaker 10/2014.  Marland Kitchen Dementia ([place])    "memory lapses q now and then; dx'd as delayed memory" (08/23/2017)  . Depression   . Facial  cellulitis 02/19/2009   "related to [facility] & having skin cancer zapped; took him off the [place]"  . History of hiatal hernia 1980s  . Hyperlipidemia   . Hypertension   . NICM (nonischemic cardiomyopathy) ([place])    a. 10/2014: EF 45%. (Prev 40-45% by echo 08/2014).  . Orthostatic hypotension   . Presence of permanent cardiac pacemaker   . Rheumatoid arthritis ([place])    "hands mainly; knees"  (08/23/2017)  . Seizures ([place]) 2008   "vs stroke; never determined which it was; on sz RX since"  (08/23/2017)  . Sleep apnea 2008   "gone since losing weight" (08/23/2017)  . Stroke [facility]) 2008   "vs seizure; never determined which it was; on sz RX since"   (08/23/2017)  . TIA (transient ischemic attack) 2008   "vs seizure; never determined which it was; on sz RX since"   (08/23/2017)    PAST SURGICAL HISTORY: Past Surgical History:  Procedure Laterality Date  . APPENDECTOMY  ~ 1988  . CARDIAC CATHETERIZATION  04/01/09   which showed low normal EF at 50% with question of underlying borderline area of minimal inferoapical hypercontractility. He had mild coronary obstructive disease with coronary calcification and segmental 20% narrowing in the LAD proximally, 20% in the mid LAD with muscle bridging. There was calcification at the ostium of the RCA, and 20 to 30%narrowing of the proximal to mid RCA with 30% distal n  . CARDIAC CATHETERIZATION N/A 11/07/2014   Procedure: Left Heart Cath and Coronary Angiography/ temp wire;  Surgeon: [name], MD;  Location: [place] [facility];  Service: Cardiovascular;  Laterality: N/A;  . CARDIOVERSION N/A 07/19/2017   Procedure: CARDIOVERSION;  Surgeon: Acie Fredrickson Wonda Cheng, MD;  Location: [place];  Service: Cardiovascular;  Laterality: N/A;  . CATARACT EXTRACTION W/ INTRAOCULAR LENS  IMPLANT, BILATERAL Bilateral early 2000's  . CHOLECYSTECTOMY OPEN  1980's  . EP IMPLANTABLE DEVICE N/A 11/08/2014   a. STJ CRTP implanted by Dr Rayann Heman  . EP IMPLANTABLE DEVICE N/A  11/10/2014   RA lead revision Dr Rayann Heman  . EXPLORATORY LAPAROTOMY  1990's   "put intestines back in & added mesh"  . FALSE ANEURYSM REPAIR Right 11/15/2014   Procedure: REPAIR OF RIGHT FEMORAL FALSE ANEURYSM ;  Surgeon: Rosetta Posner, MD;  Location: [place];  Service: Vascular;  Laterality: Right;  . HERNIA REPAIR  1990s  . KNEE CARTILAGE SURGERY Left 1990's   "opened it up"  . TONSILLECTOMY      FAMILY HISTORY: Family History  Problem Relation Age of Onset  . Heart attack Father   . Cancer - Lung Sister   . Thyroid disease Child     SOCIAL HISTORY: Social History   Socioeconomic History  . Marital status: Married  Spouse name: [name]  . Number of children: 5  . Years of education: College  . Highest education level: Not on file  Occupational History  . Not on file  Social Needs  . Financial resource strain: Not on file  . Food insecurity:    Worry: Not on file    Inability: Not on file  . Transportation needs:    Medical: Not on file    Non-medical: Not on file  Tobacco Use  . Smoking status: Never Smoker  . Smokeless tobacco: Never Used  Substance and Sexual Activity  . Alcohol use: Not Currently    Alcohol/week: 0.0 standard drinks    Frequency: Never  . Drug use: Never  . Sexual activity: Not on file  Lifestyle  . Physical activity:    Days per week: Not on file    Minutes per session: Not on file  . Stress: Not on file  Relationships  . Social connections:    Talks on phone: Not on file    Gets together: Not on file    Attends religious service: Not on file    Active member of club or organization: Not on file    Attends meetings of clubs or organizations: Not on file    Relationship status: Not on file  . Intimate partner violence:    Fear of current or ex partner: Not on file    Emotionally abused: Not on file    Physically abused: Not on file    Forced sexual activity: Not on file  Other Topics Concern  . Not on file  Social History Narrative    Patient is married [name]) and lives at home with wife and two children.   Patient has five adult children.   Patient is retired.   Patient has a college education.   Patient is right-handed.   Patient drinks two cups of coffee daily, 1/2 can of soda daily and tea- 2-3 times per week.      PHYSICAL EXAM  Vitals:   01/18/18 1010  BP: 103/66  Pulse: 100  Weight: 205 lb (93 kg)  Height: 5\' 10"  (1.778 m)   Body mass index is 29.41 kg/m.  MMSE - Mini Mental State Exam 01/18/2018 07/11/2017 07/06/2016  Not completed: (No Data) - -  Orientation to time 1 3 4   Orientation to Place 4 3 5   Registration 3 3 3   Attention/ Calculation 1 2 4   Recall 0 1 0  Language- name 2 objects 2 2 2   Language- repeat 1 1 1   Language- follow 3 step command 0 3 2  Language- read & follow direction 0 1 1  Write a sentence 1 1 1   Copy design 1 0 0  Total score 14 20 23      Generalized: Well developed, in no acute distress   Neurological examination  Mentation: Alert oriented to time, place, history taking. Follows all commands speech and language fluent Cranial nerve II-XII: Pupils were equal round reactive to light. Extraocular movements were full, visual field were full on confrontational test. Facial sensation and strength were normal. Uvula tongue midline. Head turning and shoulder shrug  were normal and symmetric. Motor: The motor testing reveals 5 over 5 strength of all 4 extremities. Good symmetric motor tone is noted throughout.  Sensory: Sensory testing is intact to soft touch on all 4 extremities. No evidence of extinction is noted.  Coordination: Cerebellar testing reveals good finger-nose-finger and heel-to-shin bilaterally.  Gait and station: Patient uses  a cane when ambulating. Reflexes: Deep tendon reflexes are symmetric and normal bilaterally.   DIAGNOSTIC DATA (LABS, IMAGING, TESTING) - I reviewed patient records, labs, notes, testing and imaging myself where available.  Lab  Results  Component Value Date   WBC 8.2 07/07/2017   HGB 12.1 (L) 07/07/2017   HCT 35.2 (L) 07/07/2017   MCV 92 07/07/2017   PLT 323 07/07/2017      Component Value Date/Time   NA 138 01/09/2018 1034   K 4.7 01/09/2018 1034   CL 98 01/09/2018 1034   CO2 24 01/09/2018 1034   GLUCOSE 108 (H) 01/09/2018 1034   GLUCOSE 135 (H) 09/02/2017 1143   BUN 21 01/09/2018 1034   CREATININE 1.08 01/09/2018 1034   CREATININE 1.18 (H) 12/25/2014 1605   CALCIUM 9.2 01/09/2018 1034   PROT 6.4 04/11/2017 1525   ALBUMIN 3.7 04/11/2017 1525   AST 19 04/11/2017 1525   ALT 14 04/11/2017 1525   ALKPHOS 78 04/11/2017 1525   BILITOT 0.6 04/11/2017 1525   GFRNONAA 63 01/09/2018 1034   GFRAA 72 01/09/2018 1034   Lab Results  Component Value Date   CHOL 127 10/24/2014   HDL 42 10/24/2014   LDLCALC 62 10/24/2014   TRIG 113 10/24/2014   CHOLHDL 3.0 10/24/2014    Lab Results  Component Value Date   TSH 1.210 04/11/2017      ASSESSMENT AND PLAN 84 y.o. year old male  has a past medical history of CAD (coronary artery disease), CHF (congestive heart failure) ([place]) (2016), Chronic pain, Complete heart block ([place]) (11/07/2014), Dementia ([place]), Depression, Facial cellulitis (02/19/2009), History of hiatal hernia (1980s), Hyperlipidemia, Hypertension, NICM (nonischemic cardiomyopathy) ([place]), Orthostatic hypotension, Presence of permanent cardiac pacemaker, Rheumatoid arthritis ([place]), Seizures ([place]) (2008), Sleep apnea (2008), Stroke [facility]) (2008), and TIA (transient ischemic attack) (2008). here with:  1.  Seizures 2.  Memory disturbance  Overall the patient has done well with his seizures.  He will continue on Keppra extended release 500 mg daily.  The patient's memory score has declined since the last visit.  He will continue on Namenda 10 mg twice a day.  We will continue to monitor his memory.  He is advised that if his symptoms worsen or he develops new symptoms he should let us know.  He will  follow-up in 6 months or sooner if needed.     Ward Givens, MSN, NP-C 01/18/2018, 10:37 AM [facility] [address] [place], [place] [address] [phone]

## 2018-01-18 NOTE — Patient Instructions (Signed)
Your Plan:  Continue Namenda Memory score slightly declined Continue Keppra If your symptoms worsen or you develop new symptoms please let us know.   Thank you for coming to see [place] at [facility]. I hope we have been able to provide you high quality care today.  You may receive a patient satisfaction survey over the next few weeks. We would appreciate your feedback and comments so that we may continue to improve ourselves and the health of our patients.

## 2018-02-10 NOTE — Progress Notes (Signed)
No ICM remote transmission received for 02/09/2018 and next ICM transmission scheduled for 03/09/2018.   

## 2018-02-19 LAB — CUP PACEART REMOTE DEVICE CHECK
Battery Remaining Longevity: 59 mo
Battery Remaining Percentage: 89 %
Battery Voltage: 2.95 V
Brady Statistic AP VP Percent: 1 %
Brady Statistic AP VS Percent: 1 %
Brady Statistic AS VP Percent: 99 %
Brady Statistic AS VS Percent: 1 %
Brady Statistic RA Percent Paced: 1 %
Date Time Interrogation Session: 20191111180231
Implantable Lead Implant Date: 20160930
Implantable Lead Implant Date: 20160930
Implantable Lead Implant Date: 20160930
Implantable Lead Location: 753858
Implantable Lead Location: 753859
Implantable Lead Location: 753860
Implantable Pulse Generator Implant Date: 20160930
Lead Channel Impedance Value: 360 Ohm
Lead Channel Impedance Value: 450 Ohm
Lead Channel Impedance Value: 630 Ohm
Lead Channel Pacing Threshold Amplitude: 0.5 V
Lead Channel Pacing Threshold Amplitude: 0.75 V
Lead Channel Pacing Threshold Amplitude: 1.75 V
Lead Channel Pacing Threshold Pulse Width: 0.5 ms
Lead Channel Pacing Threshold Pulse Width: 0.5 ms
Lead Channel Pacing Threshold Pulse Width: 0.8 ms
Lead Channel Sensing Intrinsic Amplitude: 3.5 mV
Lead Channel Sensing Intrinsic Amplitude: 7.6 mV
Lead Channel Setting Pacing Amplitude: 2 V
Lead Channel Setting Pacing Amplitude: 2 V
Lead Channel Setting Pacing Amplitude: 2.75 V
Lead Channel Setting Pacing Pulse Width: 0.5 ms
Lead Channel Setting Pacing Pulse Width: 0.8 ms
Lead Channel Setting Sensing Sensitivity: 5 mV
Pulse Gen Model: 3262
Pulse Gen Serial Number: 7798436

## 2018-02-27 ENCOUNTER — Other Ambulatory Visit (HOSPITAL_COMMUNITY): Payer: Self-pay | Admitting: Internal Medicine

## 2018-03-09 ENCOUNTER — Ambulatory Visit (INDEPENDENT_AMBULATORY_CARE_PROVIDER_SITE_OTHER): Payer: Medicare Other

## 2018-03-09 DIAGNOSIS — Z950 Presence of cardiac pacemaker: Secondary | ICD-10-CM | POA: Diagnosis not present

## 2018-03-09 DIAGNOSIS — I5022 Chronic systolic (congestive) heart failure: Secondary | ICD-10-CM | POA: Diagnosis not present

## 2018-03-10 ENCOUNTER — Telehealth: Payer: Self-pay

## 2018-03-10 NOTE — Progress Notes (Signed)
EPIC Encounter for ICM Monitoring  Patient Name: Thomas Long is a 85 y.o. male Date: 03/10/2018 Primary Care Physican: Lajean Manes, MD Primary [place] Electrophysiologist: Allred Bi-V Pacing:>99%                                                   Attempted call to patient and unable to reach.  Transmission reviewed.    Thoracic impedance normal.   No diuretic.  Recommendations: Unable to reach.  Follow-up plan: ICM clinic phone appointment on 04/11/2018.       Copy of ICM check sent to Dr. Rayann Heman  3 month ICM trend: 03/09/2018    1 Year ICM trend:       Rosalene Billings, RN 03/10/2018 9:24 AM

## 2018-03-10 NOTE — Telephone Encounter (Signed)
Remote ICM transmission received.  Attempted call to patient regarding ICM remote transmission and no answer.  

## 2018-03-13 ENCOUNTER — Other Ambulatory Visit: Payer: Self-pay | Admitting: Geriatric Medicine

## 2018-03-13 ENCOUNTER — Ambulatory Visit
Admission: RE | Admit: 2018-03-13 | Discharge: 2018-03-13 | Disposition: A | Discharge status: Home / Self Care | Payer: Medicare Other | Source: Ambulatory Visit | Attending: Geriatric Medicine | Admitting: Geriatric Medicine

## 2018-03-13 DIAGNOSIS — M545 Low back pain, unspecified: Secondary | ICD-10-CM

## 2018-03-15 ENCOUNTER — Telehealth: Payer: Self-pay | Admitting: Internal Medicine

## 2018-03-15 NOTE — Telephone Encounter (Signed)
Dr. Kelly pt.

## 2018-03-15 NOTE — Telephone Encounter (Signed)
New Message        Patient's wife is calling and would like a call back concerning her husband's fall. Pls call and advise.

## 2018-03-15 NOTE — Telephone Encounter (Signed)
Called patient and spoke with wife.  Who states the patient has had a couple episodes of falls in the past few weeks. He seen his PCP who stated he was having problems with BP dropping, they called after doing xray and he has a compression fraction of his L1, they are having PT come in to work with patient, and wife wanted [place] to be aware, that if something else was to occur they are sending him to the hospital, and wanted to make all the providers who care for her husband aware.

## 2018-03-20 ENCOUNTER — Ambulatory Visit (INDEPENDENT_AMBULATORY_CARE_PROVIDER_SITE_OTHER): Payer: Medicare Other

## 2018-03-20 DIAGNOSIS — I428 Other cardiomyopathies: Secondary | ICD-10-CM

## 2018-03-20 DIAGNOSIS — I442 Atrioventricular block, complete: Secondary | ICD-10-CM

## 2018-03-21 LAB — CUP PACEART REMOTE DEVICE CHECK
Battery Remaining Longevity: 52 mo
Battery Remaining Percentage: 80 %
Battery Voltage: 2.93 V
Brady Statistic AP VP Percent: 1 %
Brady Statistic AP VS Percent: 1 %
Brady Statistic AS VP Percent: 99 %
Brady Statistic AS VS Percent: 1 %
Brady Statistic RA Percent Paced: 1 %
Date Time Interrogation Session: 20200210184708
Implantable Lead Implant Date: 20160930
Implantable Lead Implant Date: 20160930
Implantable Lead Implant Date: 20160930
Implantable Lead Location: 753858
Implantable Lead Location: 753859
Implantable Lead Location: 753860
Implantable Pulse Generator Implant Date: 20160930
Lead Channel Impedance Value: 360 Ohm
Lead Channel Impedance Value: 550 Ohm
Lead Channel Impedance Value: 630 Ohm
Lead Channel Pacing Threshold Amplitude: 0.5 V
Lead Channel Pacing Threshold Amplitude: 0.75 V
Lead Channel Pacing Threshold Amplitude: 1.75 V
Lead Channel Pacing Threshold Pulse Width: 0.5 ms
Lead Channel Pacing Threshold Pulse Width: 0.5 ms
Lead Channel Pacing Threshold Pulse Width: 0.8 ms
Lead Channel Sensing Intrinsic Amplitude: 3 mV
Lead Channel Sensing Intrinsic Amplitude: 7.9 mV
Lead Channel Setting Pacing Amplitude: 2 V
Lead Channel Setting Pacing Amplitude: 2 V
Lead Channel Setting Pacing Amplitude: 2.75 V
Lead Channel Setting Pacing Pulse Width: 0.5 ms
Lead Channel Setting Pacing Pulse Width: 0.8 ms
Lead Channel Setting Sensing Sensitivity: 5 mV
Pulse Gen Model: 3262
Pulse Gen Serial Number: 7798436

## 2018-03-28 LAB — CUP PACEART INCLINIC DEVICE CHECK
Battery Remaining Longevity: 51 mo
Battery Voltage: 2.93 V
Brady Statistic RA Percent Paced: 0.1 %
Brady Statistic RV Percent Paced: 99.19 %
Date Time Interrogation Session: 20191202160344
Implantable Lead Implant Date: 20160930
Implantable Lead Implant Date: 20160930
Implantable Lead Implant Date: 20160930
Implantable Lead Location: 753858
Implantable Lead Location: 753859
Implantable Lead Location: 753860
Implantable Pulse Generator Implant Date: 20160930
Lead Channel Impedance Value: 375 Ohm
Lead Channel Impedance Value: 487.5 Ohm
Lead Channel Impedance Value: 712.5 Ohm
Lead Channel Pacing Threshold Amplitude: 0.5 V
Lead Channel Pacing Threshold Amplitude: 0.75 V
Lead Channel Pacing Threshold Amplitude: 1.75 V
Lead Channel Pacing Threshold Pulse Width: 0.5 ms
Lead Channel Pacing Threshold Pulse Width: 0.5 ms
Lead Channel Pacing Threshold Pulse Width: 0.8 ms
Lead Channel Sensing Intrinsic Amplitude: 3.9 mV
Lead Channel Setting Pacing Amplitude: 2 V
Lead Channel Setting Pacing Amplitude: 2 V
Lead Channel Setting Pacing Amplitude: 2.75 V
Lead Channel Setting Pacing Pulse Width: 0.5 ms
Lead Channel Setting Pacing Pulse Width: 0.8 ms
Lead Channel Setting Sensing Sensitivity: 5 mV
Pulse Gen Model: 3262
Pulse Gen Serial Number: 7798436

## 2018-03-31 NOTE — Progress Notes (Signed)
Remote pacemaker transmission.   

## 2018-04-07 ENCOUNTER — Other Ambulatory Visit: Payer: Self-pay | Admitting: Cardiovascular Disease

## 2018-04-11 ENCOUNTER — Telehealth: Payer: Self-pay

## 2018-04-11 ENCOUNTER — Ambulatory Visit (INDEPENDENT_AMBULATORY_CARE_PROVIDER_SITE_OTHER): Payer: Medicare Other

## 2018-04-11 DIAGNOSIS — Z950 Presence of cardiac pacemaker: Secondary | ICD-10-CM

## 2018-04-11 DIAGNOSIS — I5022 Chronic systolic (congestive) heart failure: Secondary | ICD-10-CM

## 2018-04-11 NOTE — Progress Notes (Signed)
EPIC Encounter for ICM Monitoring  Patient Name: Thomas Long is a 85 y.o. male Date: 04/11/2018 Primary Care Physican: Lajean Manes, MD Primary [place] Electrophysiologist: Allred Bi-V Pacing:>99%   Spoke with wife.  She reported patient fell in January resulting in fractured vertebrae.  Patient is being seen by Dr Felipa Eth for low BP, tiredness, sleeping more than usual, poor appetite and fluid intake.  She thinks he only drinks about 32 oz of fluid a day.  She reports he is only awake for 4-5 hours daily.  Wife unsure of the amount of urine output but thinks it is fine and urine is clear.    Thoracic impedance abnormal since 03/12/2018 suggesting dryness.   No diuretic.  Patient was taking advil 3 times a day starting 03/10/2018 and stopped about 10 days ago.    LABS: 04/07/2018 Creatinine 0.92, BUN 20, Potassium 4.2, Sodium 136, GFR 78-95 (received fax from Dr Carlyle Lipa office 3/3)  Recommendations:Advised to increase fluid intake by 16 oz above his current intake of 32 oz.  Phone note sent to Dr Claiborne Billings for review and recommendations if needed.  Office visit with Dr Claiborne Billings 06/01/2018.   Follow-up plan: ICM clinic phone appointment on3/11/2018 (manual send).  Contacted Dr Sherryll Burger and 04/07/2018 BMET was faxed.      Copy of ICM check sent to Dr.Allred and Dr Claiborne Billings.  3 month ICM trend: 04/11/2018    1 Year ICM trend:       Rosalene Billings, RN 04/11/2018 1:05 PM

## 2018-04-11 NOTE — Telephone Encounter (Signed)
Spoke with wife.  She reported patient fell in January resulting in fractured vertebrae.    Patient is being seen by Dr Felipa Eth for low BP, tiredness, sleeping more than usual, poor appetite and fluid intake.  She thinks he only drinks about 32 oz of fluid a day and awake about 4-5 hours daily.  Wife unsure of amount of urine output but thinks it is fine and urine is clear.     Optivol Thoracic impedance abnormal since 03/12/2018 suggesting dryness and progressively worsening.      No diuretic.  Patient was taking advil 3 times a day, for back pain, starting 03/10/2018 and stopped about 10 days ago.    LABS: 04/07/2018 Creatinine 0.92, BUN 20, Potassium 4.2, Sodium 136, GFR 78-95 (received fax from Dr Carlyle Lipa office 3/3)  Recheck fluid levels and remote transmission scheduled 04/18/2018.  Advised wife to have patient increase fluid intake by at least 16 oz above his current 32 oz intake.  Advised the report is showing dryness suggesting he may have dehydration.  Please advise if any changes are recommended. Will call patient back if changes are advised.

## 2018-04-11 NOTE — Telephone Encounter (Signed)
Spoke with patient's wife to remind of missed remote transmission

## 2018-04-12 NOTE — Progress Notes (Signed)
Phone note response per Dr Claiborne Billings.   April 12, 2018  [name], MD  1:18 PM  Note    Agree with recommendations

## 2018-04-12 NOTE — Telephone Encounter (Signed)
Will follow up with patient on 04/18/2018 to recheck remote transmission.

## 2018-04-12 NOTE — Telephone Encounter (Signed)
Agree with recommendations.  

## 2018-04-18 ENCOUNTER — Telehealth: Payer: Self-pay

## 2018-04-18 ENCOUNTER — Ambulatory Visit (INDEPENDENT_AMBULATORY_CARE_PROVIDER_SITE_OTHER): Payer: Medicare Other

## 2018-04-18 DIAGNOSIS — Z950 Presence of cardiac pacemaker: Secondary | ICD-10-CM

## 2018-04-18 DIAGNOSIS — I5022 Chronic systolic (congestive) heart failure: Secondary | ICD-10-CM

## 2018-04-18 NOTE — Telephone Encounter (Signed)
Spoke with patient to remind of missed remote transmission 

## 2018-04-19 NOTE — Progress Notes (Signed)
EPIC Encounter for ICM Monitoring  Patient Name: RYKER SUDBURY is a 85 y.o. male Date: 04/19/2018 Primary Care Physican: Lajean Manes, MD Primary [place] Electrophysiologist: Allred Bi-V Pacing:>99%   Spoke with wife.  Pt has a fractured vertebrae as result of January fall.  Dr Felipa Eth is following him for low BP, tiredness, sleeping more than usual, poor appetite and fluid intake.  Patient has been drinking more fluids and is a little more alert.  She said BP is better since drinking more fluids.   Thoracic impedance abnormal since 03/12/2018 suggesting dryness.   No diuretic.  Patient started taking Advil 3 x a day on 03/10/2018 and stopped at toward the end of Feb.    LABS: 04/07/2018 Creatinine 0.92, BUN 20, Potassium 4.2, Sodium 136, GFR 78-95 (received fax from Dr Carlyle Lipa office 3/3)  Recommendations:Advised to continue to try and drink up to 64 oz of fluid daily to help with BP.   Follow-up plan: ICM clinic phone appointment on 05/15/2018.  Office visit with Dr Claiborne Billings 06/01/2018.    Copy of ICM check sent to Dr.Allred.  3 month ICM trend: 04/18/2018    1 Year ICM trend:       Rosalene Billings, RN 04/19/2018 1:55 PM

## 2018-04-29 IMAGING — CR DG CHEST 2V
2 series · 2 of 2 positions shown · non-contrast
Comparison: None.

CLINICAL DATA: Hypotension.  Dementia, TIA

EXAM:
CHEST  2 VIEW

[chest lat]
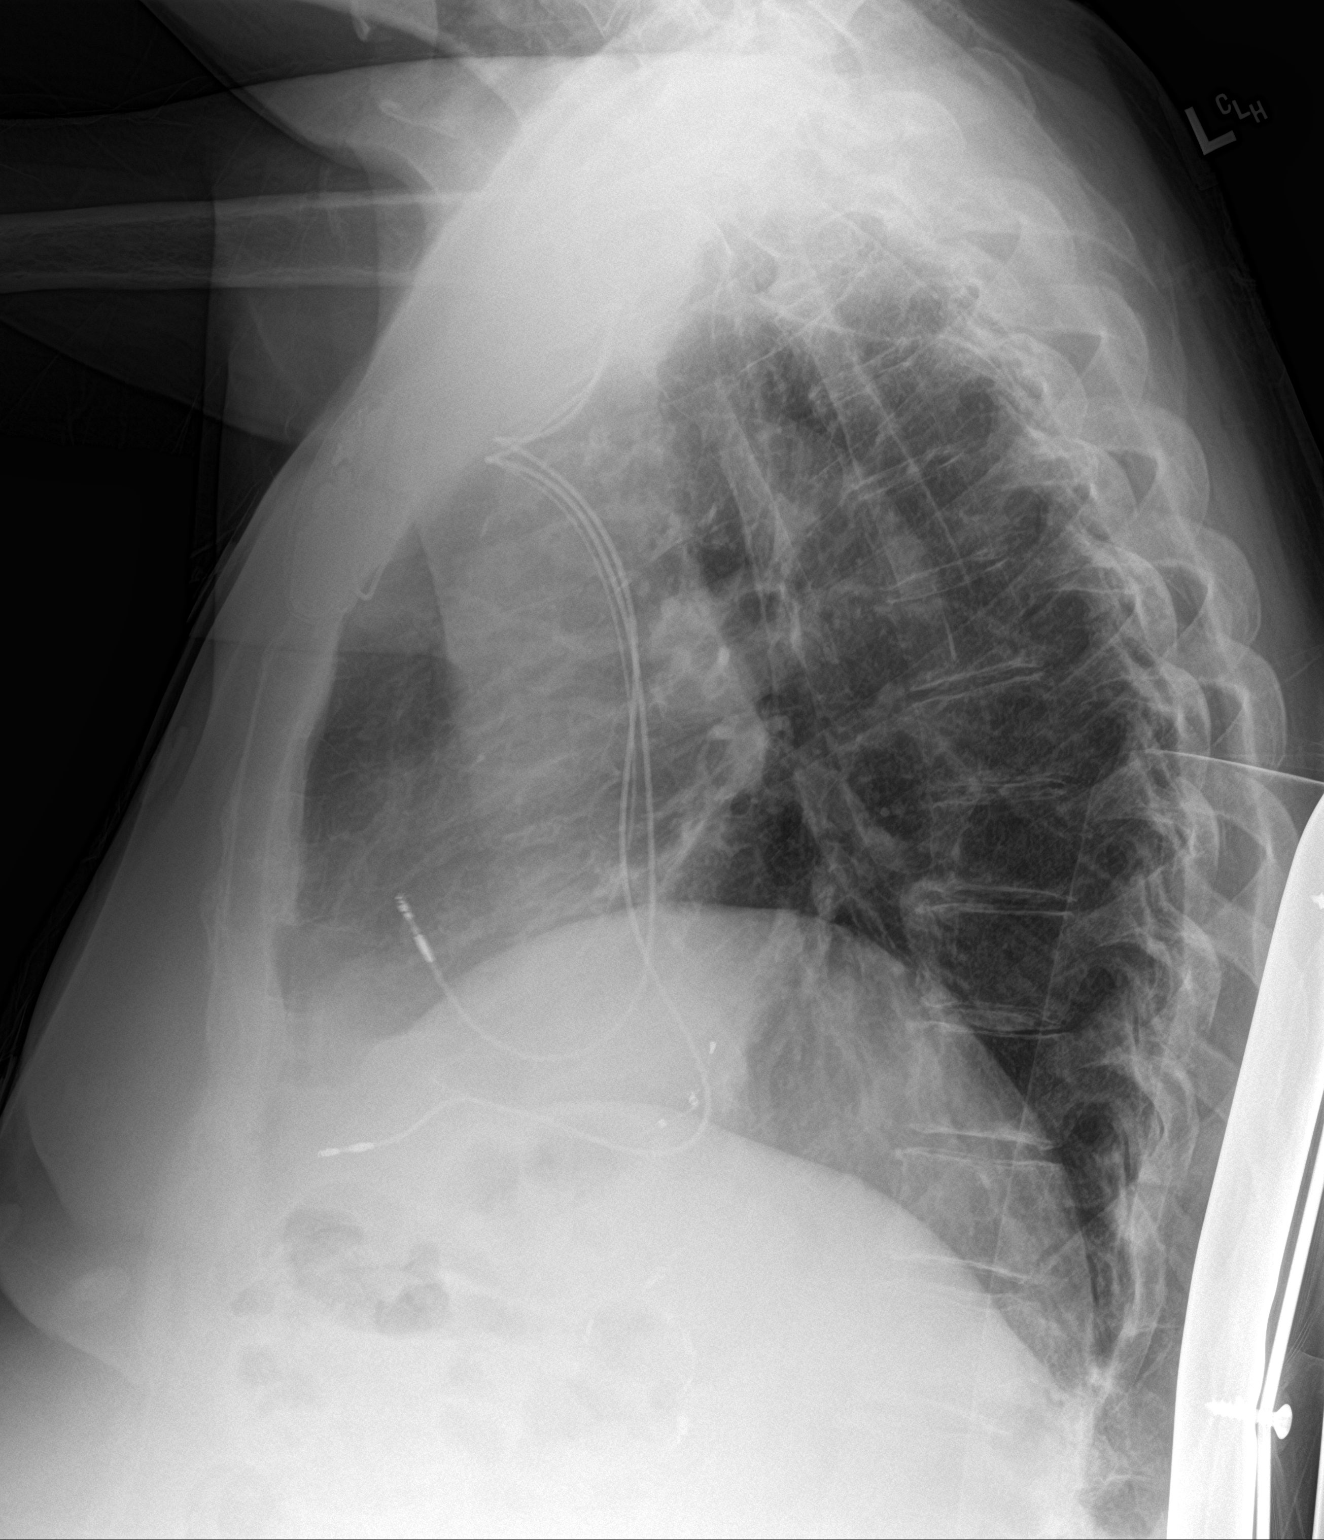

[chest ap]
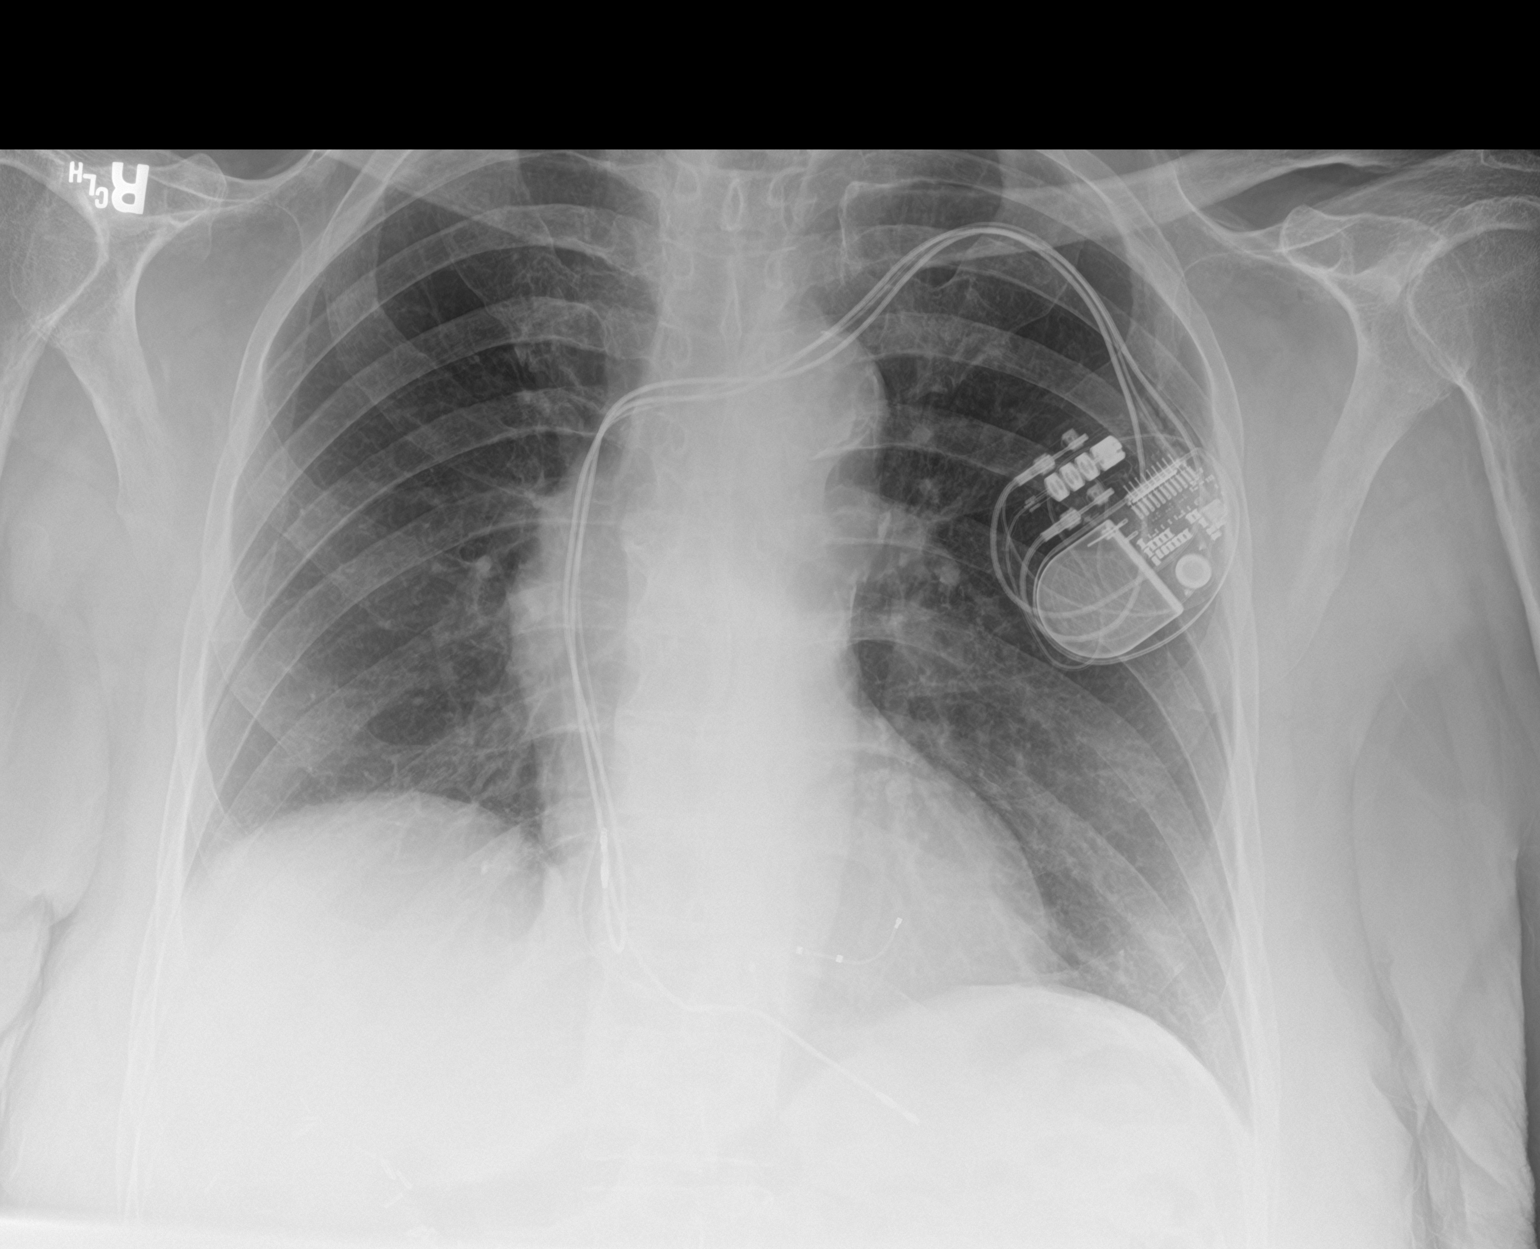

[2 of 2 positions shown; findings below may reference images not displayed]

FINDINGS: LEFT-sided pacemaker overlies stable cardiac silhouette. No
effusion, infiltrate or pneumothorax. No acute osseous abnormality.
IMPRESSION: No acute cardiopulmonary process.

## 2018-05-15 ENCOUNTER — Other Ambulatory Visit: Payer: Self-pay

## 2018-05-15 ENCOUNTER — Ambulatory Visit (INDEPENDENT_AMBULATORY_CARE_PROVIDER_SITE_OTHER): Payer: Medicare Other

## 2018-05-15 DIAGNOSIS — Z950 Presence of cardiac pacemaker: Secondary | ICD-10-CM | POA: Diagnosis not present

## 2018-05-15 DIAGNOSIS — I5022 Chronic systolic (congestive) heart failure: Secondary | ICD-10-CM

## 2018-05-17 ENCOUNTER — Telehealth: Payer: Self-pay

## 2018-05-17 IMAGING — DX DG CHEST 2V
2 series · 2 of 2 positions shown · non-contrast
Comparison: PA and lateral chest x-ray dated March 24, 2017

CLINICAL DATA: Exertional shortness of breath associated with
dizziness and weakness. Patient has a pacemaker. History of TIA,
coronary artery disease, complete heart block, CHF, never smoked.

EXAM:
CHEST  2 VIEW

[dg chest 2 view (1 of 2)]
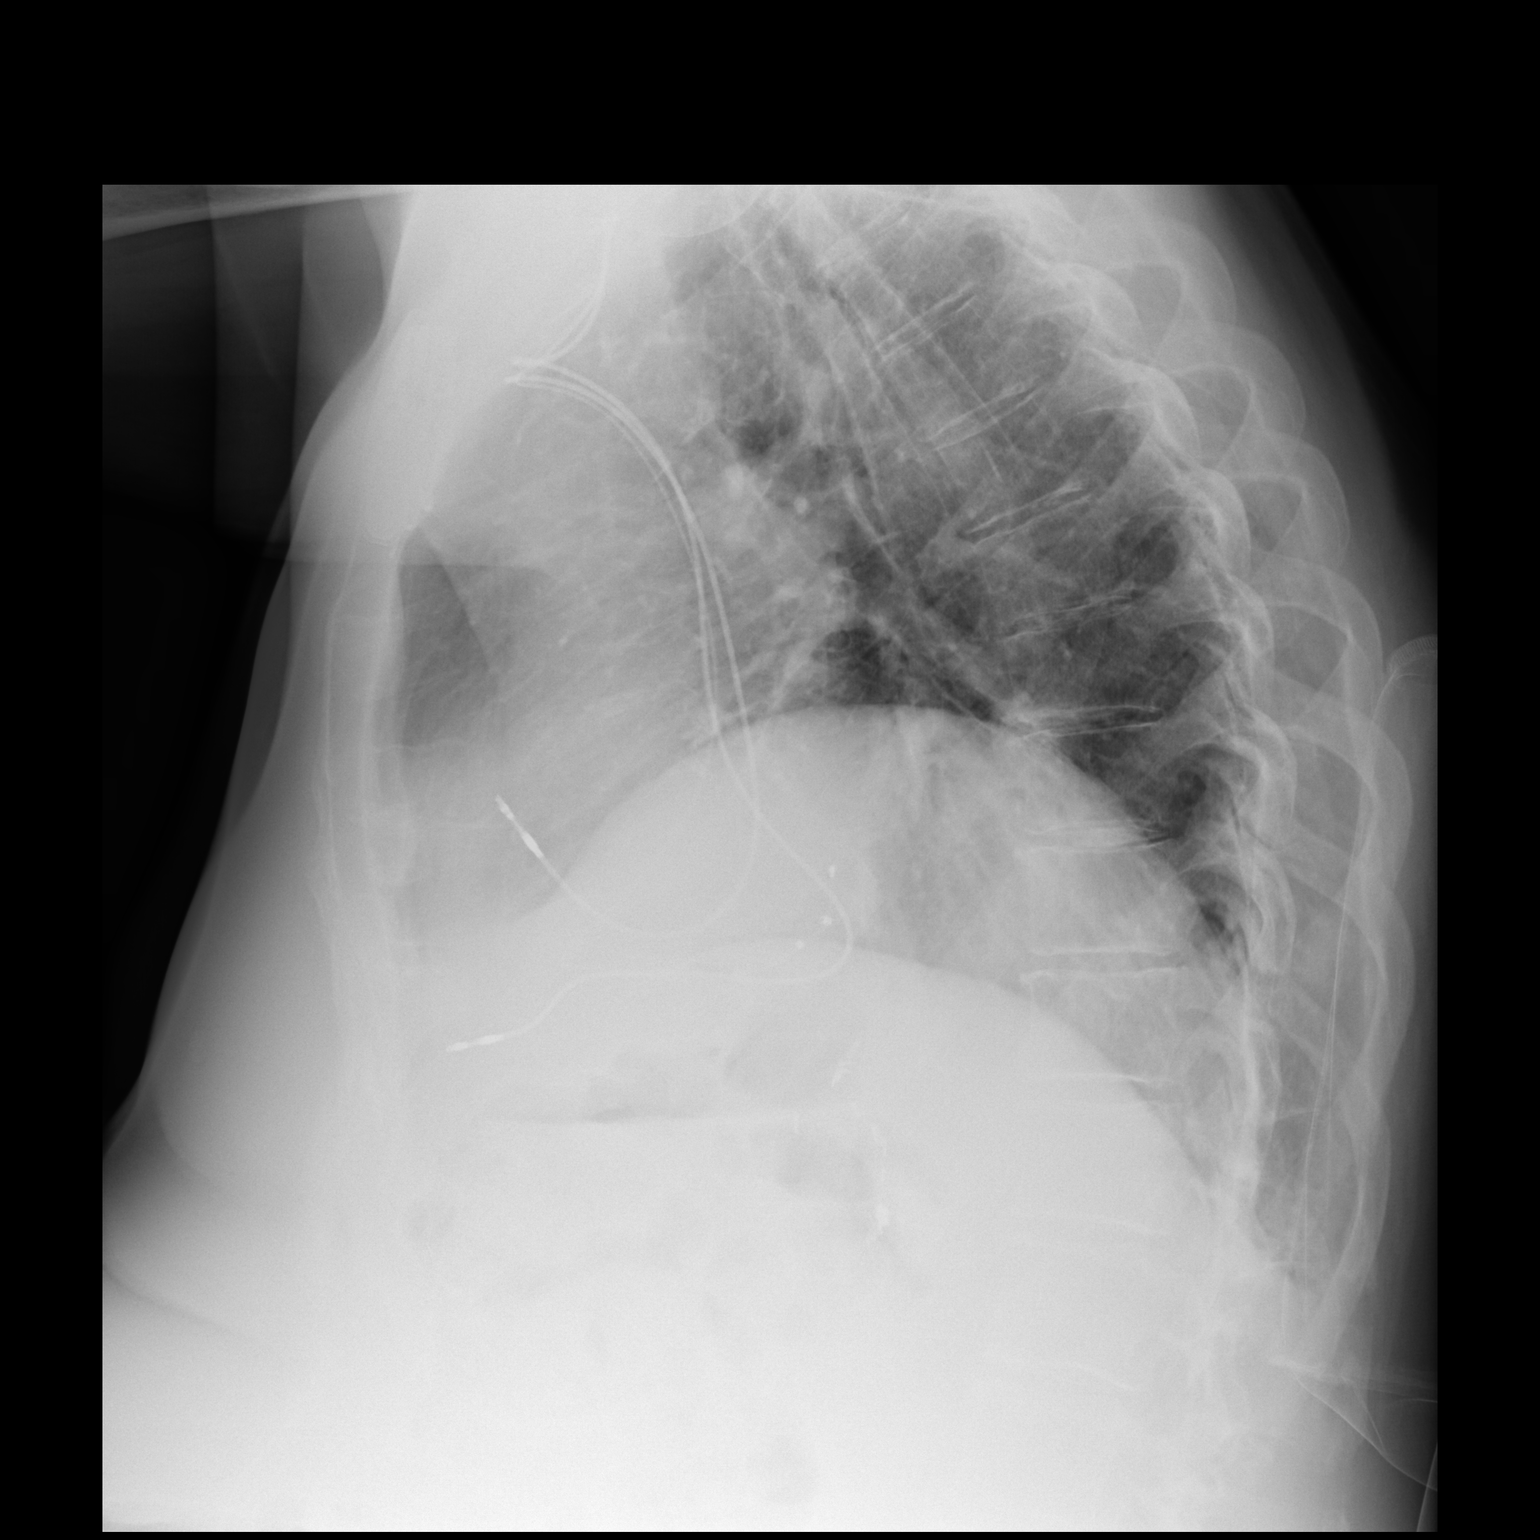

[dg chest 2 view (2 of 2)]
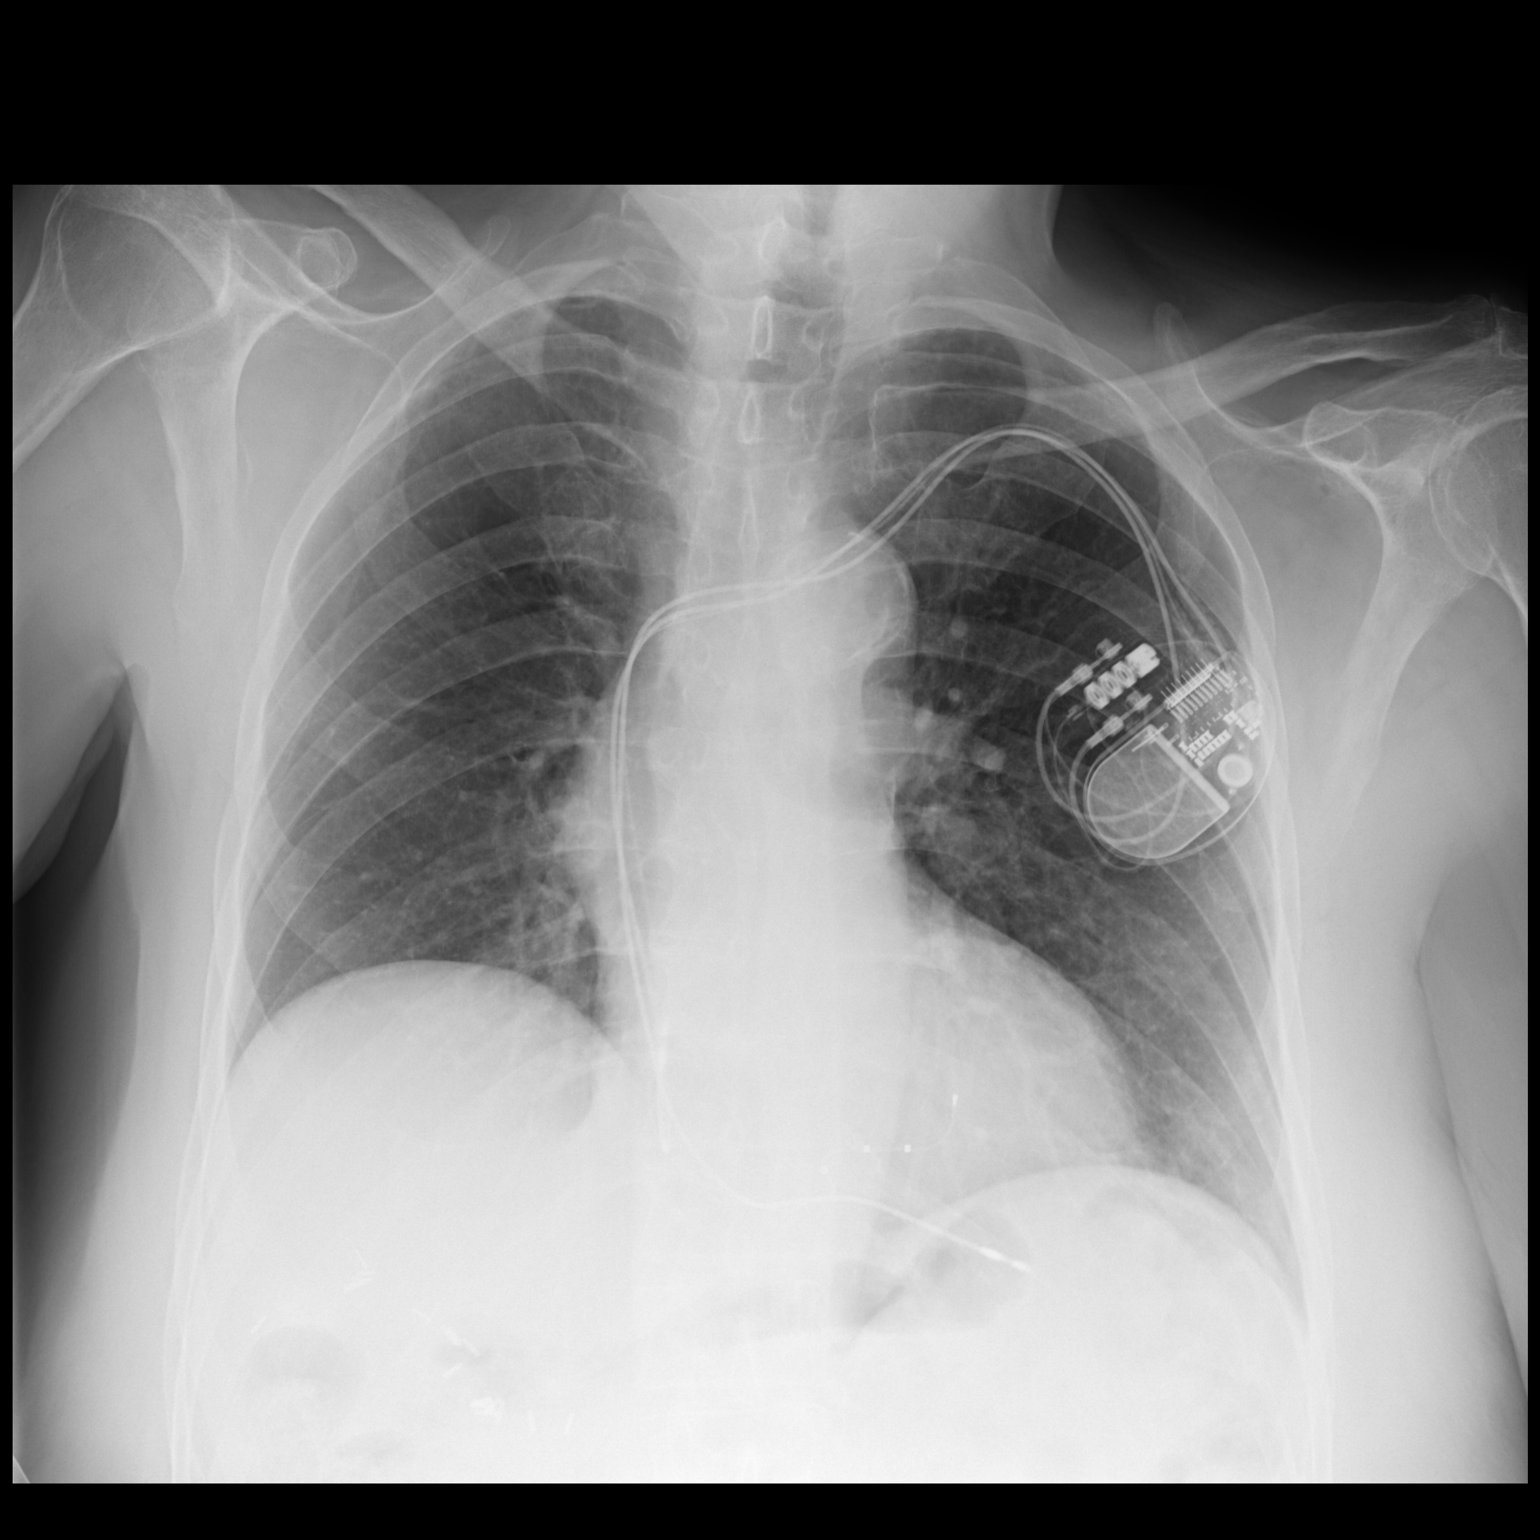

[2 of 2 positions shown; findings below may reference images not displayed]

FINDINGS: The right hemidiaphragm remains higher than the left. The lungs are
adequately inflated and clear. The heart and pulmonary vascularity
are normal. The pacemaker is in stable position. There is
calcification in the wall of the aortic arch. There is no pleural
effusion or pneumothorax. The bony thorax exhibits no acute
abnormality.
IMPRESSION: Stable appearance of the chest since the previous study. No acute
cardiopulmonary abnormality.

Thoracic aortic atherosclerosis.

## 2018-05-17 NOTE — Progress Notes (Signed)
EPIC Encounter for ICM Monitoring  Patient Name: Thomas Long is a 85 y.o. male Date: 05/17/2018 Primary Care Physican: Lajean Manes, MD Primary [place] Electrophysiologist: Allred Bi-V Pacing:>99%   Spoke with wife. Patient is feeling and eating better. She said Dr Felipa Eth thinks patient is doing better and BP is more stable.    Thoracic impedanceabnormalsince 03/12/2018 suggesting dryness.  No diuretic.  LABS: 04/07/2018 Creatinine 0.92, BUN 20, Potassium 4.2, Sodium 136, GFR 78-95 (received fax from Dr Carlyle Lipa office 3/3)  Recommendations:No changed and encouraged to call for fluid symptoms.   Follow-up plan: ICM clinic phone appointment on 06/20/2018. Office visit with Dr Claiborne Billings 06/01/2018.   Copy of ICM check sent to Dr.Allred.  3 month ICM trend: 05/15/2018    1 Year ICM trend:       Rosalene Billings, RN 05/17/2018 8:11 AM

## 2018-05-17 NOTE — Telephone Encounter (Signed)
Remote ICM transmission received.  Attempted call to patient regarding ICM remote transmission and left message, per DPR, to return call.    

## 2018-05-29 ENCOUNTER — Telehealth: Payer: Self-pay | Admitting: Cardiovascular Disease

## 2018-05-29 NOTE — Telephone Encounter (Signed)
Spoke to pt daughter. She stated that pt has not been having any problems (chest pain, SOB, swelling, dizziness) and ok to reschedule. She stated that pt would be able to do a video visit and will have access to smartphone. Pt has been rescheduled to 06/14/18 at 8:40 AM as a video visit with Dr. Claiborne Billings. Informed pt daughter that we will contact pt when closer to appt date to set up for video visit.  Also sent MyChart message with Information for visit.  Pt daughter verbalized understanding and thanks for the call.

## 2018-06-01 ENCOUNTER — Ambulatory Visit: Payer: Medicare Other | Admitting: Cardiovascular Disease

## 2018-06-12 ENCOUNTER — Telehealth: Payer: Self-pay | Admitting: Cardiovascular Disease

## 2018-06-12 NOTE — Telephone Encounter (Signed)
Spoke to pt daughter who said she has a smartphone to help pt with video visit. She stated that she takes pt BP every day and weight once per week.   Explained to pt daughter what platform Dr. Claiborne Billings is using and process for visit. Asked her to review message I sent to pt MyChart which goes over everything we talked about. Pt daughter verbalized understanding and thanks.

## 2018-06-13 ENCOUNTER — Telehealth: Payer: Self-pay | Admitting: Cardiovascular Disease

## 2018-06-13 NOTE — Telephone Encounter (Signed)
Home phone/ consent/ my chart/ pre reg completed °

## 2018-06-14 ENCOUNTER — Telehealth (INDEPENDENT_AMBULATORY_CARE_PROVIDER_SITE_OTHER): Payer: Medicare Other | Admitting: Cardiovascular Disease

## 2018-06-14 ENCOUNTER — Encounter: Payer: Self-pay | Admitting: Cardiovascular Disease

## 2018-06-14 VITALS — BP 103/60 | HR 101 | Ht 70.0 in | Wt 193.0 lb

## 2018-06-14 DIAGNOSIS — I2510 Atherosclerotic heart disease of native coronary artery without angina pectoris: Secondary | ICD-10-CM | POA: Diagnosis not present

## 2018-06-14 DIAGNOSIS — I480 Paroxysmal atrial fibrillation: Secondary | ICD-10-CM | POA: Diagnosis not present

## 2018-06-14 DIAGNOSIS — Z950 Presence of cardiac pacemaker: Secondary | ICD-10-CM

## 2018-06-14 DIAGNOSIS — E785 Hyperlipidemia, unspecified: Secondary | ICD-10-CM

## 2018-06-14 DIAGNOSIS — R0602 Shortness of breath: Secondary | ICD-10-CM

## 2018-06-14 DIAGNOSIS — I5042 Chronic combined systolic (congestive) and diastolic (congestive) heart failure: Secondary | ICD-10-CM

## 2018-06-14 DIAGNOSIS — I428 Other cardiomyopathies: Secondary | ICD-10-CM | POA: Diagnosis not present

## 2018-06-14 DIAGNOSIS — I10 Essential (primary) hypertension: Secondary | ICD-10-CM

## 2018-06-14 DIAGNOSIS — I5022 Chronic systolic (congestive) heart failure: Principal | ICD-10-CM

## 2018-06-14 NOTE — Patient Instructions (Signed)
Medication Instructions:  The current medical regimen is effective;  continue present plan and medications.  If you need a refill on your cardiac medications before your next appointment, please call your pharmacy.   Testing/Procedures: Echocardiogram (6 months) - Your physician has requested that you have an echocardiogram. Echocardiography is a painless test that uses sound waves to create images of your heart. It provides your doctor with information about the size and shape of your heart and how well your heart's chambers and valves are working. This procedure takes approximately one hour. There are no restrictions for this procedure. This will be performed at our [facility] location - [address], [address].   Follow-Up: At [facility], you and your health needs are our priority.  As part of our continuing mission to provide you with exceptional heart care, we have created designated Provider Care Teams.  These Care Teams include your primary Cardiologist (physician) and Advanced Practice Providers (APPs -  Physician Assistants and Nurse Practitioners) who all work together to provide you with the care you need, when you need it. You will need a follow up appointment in 6 months after ECHO.  Please call our office 2 months in advance to schedule this appointment.  You may see Dr.Kelly or one of the following Advanced Practice Providers on your designated Care Team: Almyra Deforest, [place] . Fabian Sharp, PA-C  Any Other Special Instructions Will Be Listed Below (If Applicable). Try to drink Gatorade

## 2018-06-14 NOTE — Progress Notes (Signed)
Virtual Visit via Video Note   This visit type was conducted due to national recommendations for restrictions regarding the COVID-19 Pandemic (e.g. social distancing) in an effort to limit this patient's exposure and mitigate transmission in our community.  Due to his co-morbid illnesses, this patient is at least at moderate risk for complications without adequate follow up.  This format is felt to be most appropriate for this patient at this time.  All issues noted in this document were discussed and addressed.  A limited physical exam was performed with this format.  Please refer to the patient's chart for his consent to telehealth for [facility].   Date:  06/14/2018   ID:  Thomas Long, DOB Jun 15, 1931, MRN [id number]  Patient Location: Home Provider Location: Office  PCP:  Lajean Manes, MD  Cardiologist:  [name], MD Electrophysiologist:  [name], MD   Evaluation Performed:  Follow-Up Visit  Chief Complaint:    History of Present Illness:    Thomas Long is a 85 y.o. 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  In 2011 cardiac catheterization revealed ejection fraction at 50% with mild coronary artery disease coronary calcification with segmental 20% narrowing in the proximal LAD and mid LAD with mild muscle bridging, calcification at the ostium of the right coronary artery with 20-30% narrowing in the proximal to mid RCA and 30% distal narrowing. He has a history of a TIA with possible seizure and has been on Aggrenox and Keppra and is currently followed by neurology. An echo Doppler study in April 2012 showed an ejection fraction of 50% with inferior hypokinesis, mild mitral annular calcification and mild TR, mild aortic sclerosis without stenosis. I had seen him on 05/30/2012 with complaints of increased fatigability as well as shortness of breath with less activity.  A two-year followup echo Doppler study on 07/06/2012 which showed mild LVH.  Ejection fraction was 50-55% and again there was mild hypokinesis of the basal mid inferior myocardium and  grade 1 diastolic dysfunction. He had minimal pulmonary hypertension with PA pressure 32 mm, mitral annular calcification with trivial MR. His aortic valve was mildly sclerotic without stenosis.  When I saw on 08/01/2012 he had orthostatic symptoms and was hypotensive. At that time, I weaned slowly and ultimately discontinued his Bystolic. He felt improved off this therapy. He has been without exertional chest pain.  He does admit to some aching nonexertional chest pain, particularly when he lies down, which lasts seconds to minutes. He does note some shortness of breath particularly with activity.  He denies any recent seizure activity.  He has had some difficulty with early memory loss and has been started on Namenda 10 mg twice a day by neurology.  He continues to take atorvastatin 10 mg for hyperlipidemia.  He is unaware of palpitations.   A follow-up echo Doppler study on 08/29/2014 showed normal LV cavity size, but his LV function has slightly declined and was now 40-45% without segmental wall motion abnormality.  There also was felt to be mild the reduced RV systolic function.  He has had issues with low blood pressure.  He  saw Karrie Doffing, PA in Dr. Stefani Dama office.  He has not had any seizure activity but continues to be on Keppra.  He does have some short-term memory issues, which seem to be exacerbated by being overtired.   He was admitted to [facility] hospital on 11/07/2014 with symptomatic complete heart block.  Upon presenting to the  emergency room, his heart rate was in the 30s.  He had experienced several episodes of recurrent chest pain.  He was brought semi-urgently to the cardiac catheterization laboratory in cardiac catheterization was performed by me without difficulty.  This revealed no significant obstructive CAD but there was mild mid systolic bridging in the mid LAD with  narrowing up to 50% during systole.  He had a normal left circumflex coronary artery and there was calcification of the ostium of the RCA without significant stenosis and a very large dominant RCA vessel.  He had mild global LV dysfunction with an ejection fraction at 45%.  There was extensive mitral annular calcification.  He underwent insertion of a temporary transvenous pacemaker for his underlying complete heart block.  The following day he underwent permanent pacemaker insertion by Dr. Rayann Heman and had a [employer] biventricular pacemaker implantation without difficulty.  Unfortunately, he subsequently went back to the EP lab due to right atrial lead dislodgment and a new right atrial lead was placed and it was repositioning of a previously implanted right ventricular lead.  He developed a right groin pseudoaneurysm and underwent repair by Dr. Sherren Mocha Early on 11/15/2014.  He saw Dr. Rayann Heman back in the office on 01/07/2015.  Interrogation of his device revealed paroxysmal atrial fibrillation.  As result, his Aggrenox was discontinued and he was started on Xarelto 20 mg daily.  A follow-up echo Doppler study now showed normalization of LV function with an ejection fraction of 55-60%.  There was grade 2 diastolic dysfunction.  There was mild mitral regurgitation and mild dilatation of his left atrium.  Since I  saw him in March 2018, he has been seen by Dr. Rayann Heman and was doing well from an EP standpoint without recurrent atrial fibrillation.  He was started on corlander 5 mg.  he has occasional low blood pressure.  He had normal pacemaker function.  He has continued to be on Xarelto for his atrial fibrillation and prior stroke.  He was evaluated in the emergency room on March 24, 2017 . He was recently seen by Almyra Deforest, PAC.  He had more pronounced weakness.  He denied chest pain.  He underwent a follow-up echo Doppler study on April 15, 2017 which revealed an EF of 50-55%.  There was grade 1 diastolic  dysfunction.  Aortic root size was minimally increased.  The ascending aorta measured 40 mm.  There was mitral annular calcification with mild MR.  Abnormal septal function due to pacemaker induced dyssynergy.   I last saw him in March 2019.  He subsequently developed recurrent atrial fibrillation and was hospitalized for Tikosyn load in July 2019. Subsequently, he has not been demonstrated to have any recurrent episodes of atrial arrhythmia.  He continues to be followed by Dr. Rayann Heman.  His last evaluation was December 2019. He has normal by V pacemaker function.  His AF burden is less than 1% on Tikosyn.  He continues to be on Xarelto with a chads2vasc score of 5.  His LV function has improved with CRT.  He has had issues with low blood pressure.  He walks with a walker.  He had fallen 2 times this year secondary to tripping.  He is followed by neurology.  He denies any recurrent seizures.  He denies PND orthopnea.  He does have intermittent issues with short-term memory.  He presents for evaluation.   The patient does not have symptoms concerning for COVID-19 infection (fever, chills, cough, or new shortness of breath).  Past Medical History:  Diagnosis Date   CAD (coronary artery disease)    a. LHC 10/2014: nonobstructive disease, 30% LAD with bridging up to 50%.   CHF (congestive heart failure) ([place]) 2016   "this was the reason for his pacemaker" (08/23/2017)   Chronic pain    Complete heart block ([place]) 11/07/2014   a. s/p [facility] Quadra Allure MP RF model [id number] (serial number [id number]) biventricular pacemaker 10/2014.   Dementia ([place])    "memory lapses q now and then; dx'd as delayed memory" (08/23/2017)   Depression    Facial cellulitis 02/19/2009   "related to [facility] & having skin cancer zapped; took him off the [place]"   History of hiatal hernia 1980s   Hyperlipidemia    Hypertension    NICM (nonischemic cardiomyopathy) ([place])    a. 10/2014: EF 45%. (Prev 40-45% by echo  08/2014).   Orthostatic hypotension    Presence of permanent cardiac pacemaker    Rheumatoid arthritis ([place])    "hands mainly; knees"  (08/23/2017)   Seizures ([place]) 2008   "vs stroke; never determined which it was; on sz RX since"  (08/23/2017)   Sleep apnea 2008   "gone since losing weight" (08/23/2017)   Stroke ([place]) 2008   "vs seizure; never determined which it was; on sz RX since"   (08/23/2017)   TIA (transient ischemic attack) 2008   "vs seizure; never determined which it was; on sz RX since"   (08/23/2017)   Past Surgical History:  Procedure Laterality Date   APPENDECTOMY  ~ [place]  04/01/09   which showed low normal EF at 50% with question of underlying borderline area of minimal inferoapical hypercontractility. He had mild coronary obstructive disease with coronary calcification and segmental 20% narrowing in the LAD proximally, 20% in the mid LAD with muscle bridging. There was calcification at the ostium of the RCA, and 20 to 30%narrowing of the proximal to mid RCA with 30% distal n   CARDIAC CATHETERIZATION N/A 11/07/2014   Procedure: Left Heart Cath and Coronary Angiography/ temp wire;  Surgeon: [name], MD;  Location: [facility];  Service: Cardiovascular;  Laterality: N/A;   CARDIOVERSION N/A 07/19/2017   Procedure: CARDIOVERSION;  Surgeon: Acie Fredrickson, Wonda Cheng, MD;  Location: [place];  Service: Cardiovascular;  Laterality: N/A;   CATARACT EXTRACTION W/ INTRAOCULAR LENS  IMPLANT, BILATERAL Bilateral early 2000's   CHOLECYSTECTOMY OPEN  1980's   EP IMPLANTABLE DEVICE N/A 11/08/2014   a. STJ CRTP implanted by Dr Rayann Heman   EP IMPLANTABLE DEVICE N/A 11/10/2014   RA lead revision Dr Rayann Heman   EXPLORATORY LAPAROTOMY  1990's   "put intestines back in & added mesh"   FALSE ANEURYSM REPAIR Right 11/15/2014   Procedure: REPAIR OF RIGHT FEMORAL FALSE ANEURYSM ;  Surgeon: Rosetta Posner, MD;  [facility];  Service: Vascular;  Laterality:  Right;   [place] Left 1990's   "opened it up"   TONSILLECTOMY       Current Meds  Medication Sig   acetaminophen (TYLENOL) 500 MG tablet Take 500 mg by mouth every 6 (six) hours as needed for moderate pain or headache.   atorvastatin (LIPITOR) 10 MG tablet TAKE 1 TABLET DAILY   cetirizine (ZYRTEC) 10 MG tablet Take 10 mg by mouth daily.   dofetilide (TIKOSYN) 250 MCG capsule TAKE ONE CAPSULE BY MOUTH TWICE A DAY   latanoprost (XALATAN) 0.005 %  ophthalmic solution Place 1 drop into both eyes at bedtime.    levETIRAcetam (KEPPRA XR) 500 MG 24 hr tablet Take 1 tablet (500 mg total) by mouth daily.   memantine (NAMENDA) 10 MG tablet Take 1 tablet (10 mg total) by mouth 2 (two) times daily.   metoprolol succinate (TOPROL XL) 25 MG 24 hr tablet Take 1 tablet (25 mg total) by mouth daily.   Multiple Vitamins-Minerals (CENTRUM SILVER ADULT 50+ PO) Take 1 capsule by mouth daily.   rivaroxaban (XARELTO) 20 MG TABS tablet Take 1 tablet (20 mg total) by mouth daily with supper.   [DISCONTINUED] loratadine (CLARITIN) 10 MG tablet Take 10 mg by mouth daily.     Allergies:   Patient has no known allergies.   Social History   Tobacco Use   Smoking status: Never Smoker   Smokeless tobacco: Never Used  Substance Use Topics   Alcohol use: Not Currently    Alcohol/week: 0.0 standard drinks    Frequency: Never   Drug use: Never     Family Hx: The patient's family history includes Cancer - Lung in his sister; Heart attack in his father; Thyroid disease in his child.  ROS:   Please see the history of present illness.    No fevers chills night sweats, cough, change in smell or taste History of glaucoma No wheezing No chest pain, PND orthopnea.  No awareness of recurrent atrial fibrillation No bleeding on Eliquis No heat or cold intolerance Positive for dementia No recent seizures Sleeping well All other systems reviewed and are  negative.   Prior CV studies:   The following studies were reviewed today:  I have reviewed his subsequent evaluation since his last office visit including hospitalization, visit with Dr. Rayann Heman, and pacemaker evaluations.  Labs/Other Tests and Data Reviewed:    EKG:  An ECG dated 01/09/2018 was personally reviewed today and demonstrated:  A sensing and V pacing at 98  Recent Labs: 07/07/2017: Hemoglobin 12.1; Platelets 323 01/09/2018: BUN 21; Creatinine, Ser 1.08; Magnesium 2.1; Potassium 4.7; Sodium 138   Recent Lipid Panel Lab Results  Component Value Date/Time   CHOL 127 10/24/2014 08:14 AM   TRIG 113 10/24/2014 08:14 AM   HDL 42 10/24/2014 08:14 AM   CHOLHDL 3.0 10/24/2014 08:14 AM   LDLCALC 62 10/24/2014 08:14 AM    Wt Readings from Last 3 Encounters:  06/14/18 193 lb (87.5 kg)  01/18/18 205 lb (93 kg)  01/09/18 201 lb 6.4 oz (91.4 kg)     Objective:    Vital Signs:  BP 103/60    Pulse (!) 101    Ht 5\' 10"  (1.778 m)    Wt 193 lb (87.5 kg)    BMI 27.69 kg/m    His daughter was with him for this video conference.  Blood pressures over the last several days have been 116/74 on May 3, 101/65 on May 4, and 117/74 on May 5.  He is well-developed in no acute distress. Breathing is nonlabored He does appear somewhat frail There is no JVD According to his daughter his rhythm is stable There is no discomfort to his chest with palpation or his abdomen There is no leg swelling He has dementia for which he is on Namenda There is no tremor. His affect is slow, he remains very pleasant.  ASSESSMENT & PLAN:    1. CAD: He is not having any anginal symptomatology.  He continues to be on Toprol-XL 25 mg 2. History of complete heart  block: Well-functioning BiV ICD 3. PAF: There is no reported awareness of heart rate irregularity and apparently he has been maintaining sinus rhythm since initiation of Tikosyn and low-dose Toprol-XL 25 mg/. 4. History of chronic systolic diastolic  heart failure: LV function has improved with CRT.  Plan to follow-up echo Doppler study in future. 5. Hyperlipidemia: Most recent laboratory excellent on low-dose atorvastatin 10 mg 6. Anticoagulation: Doing well on Xarelto without bleeding 7. Dementia: Tolerating Namenda 10 mg.  At times his short-term memory is good and other times according to his daughter it is more difficult 8. History of seizure disorder: No further seizure activity followed by neurology; continues to be on Keppra 9.   COVID-19 Education: The signs and symptoms of COVID-19 were discussed with the patient and how to seek care for testing (follow up with PCP or arrange E-visit).  The importance of social distancing was discussed today.  Time:   Today, I have spent 25 minutes with the patient with telehealth technology discussing the above problems.     Medication Adjustments/Labs and Tests Ordered: Current medicines are reviewed at length with the patient today.  Concerns regarding medicines are outlined above.   Tests Ordered: No orders of the defined types were placed in this encounter.   Medication Changes: No orders of the defined types were placed in this encounter.   Disposition:  Follow up; echo and ov  6 months  Signed, [name], MD  06/14/2018 8:53 AM    [place]

## 2018-06-19 ENCOUNTER — Telehealth: Payer: Self-pay

## 2018-06-19 ENCOUNTER — Ambulatory Visit (INDEPENDENT_AMBULATORY_CARE_PROVIDER_SITE_OTHER): Payer: Medicare Other | Admitting: *Deleted

## 2018-06-19 ENCOUNTER — Other Ambulatory Visit: Payer: Self-pay

## 2018-06-19 DIAGNOSIS — I5042 Chronic combined systolic (congestive) and diastolic (congestive) heart failure: Secondary | ICD-10-CM

## 2018-06-19 DIAGNOSIS — I428 Other cardiomyopathies: Secondary | ICD-10-CM

## 2018-06-20 ENCOUNTER — Telehealth: Payer: Self-pay

## 2018-06-20 ENCOUNTER — Other Ambulatory Visit: Payer: Self-pay

## 2018-06-20 ENCOUNTER — Ambulatory Visit (INDEPENDENT_AMBULATORY_CARE_PROVIDER_SITE_OTHER): Payer: Medicare Other

## 2018-06-20 DIAGNOSIS — Z950 Presence of cardiac pacemaker: Secondary | ICD-10-CM | POA: Diagnosis not present

## 2018-06-20 DIAGNOSIS — I5042 Chronic combined systolic (congestive) and diastolic (congestive) heart failure: Secondary | ICD-10-CM

## 2018-06-20 LAB — CUP PACEART REMOTE DEVICE CHECK
Date Time Interrogation Session: 20200512113443
Implantable Lead Implant Date: 20160930
Implantable Lead Implant Date: 20160930
Implantable Lead Implant Date: 20160930
Implantable Lead Location: 753858
Implantable Lead Location: 753859
Implantable Lead Location: 753860
Implantable Pulse Generator Implant Date: 20160930
Pulse Gen Model: 3262
Pulse Gen Serial Number: 7798436

## 2018-06-20 NOTE — Progress Notes (Signed)
EPIC Encounter for ICM Monitoring  Patient Name: Thomas Long is a 85 y.o. male Date: 06/20/2018 Primary Care Physican: Lajean Manes, MD [facility] Electrophysiologist: Allred Bi-V Pacing:>99%   Spoke with daughter, Thomas Long (message sent to call her). Patient is doing well but his wife was in the hospital 10 days but has been discharged home.      Thoracic impedancenormal (change in reference and daily impedance line starting 03/2018).  No diuretic.  LABS: 04/07/2018 Creatinine 0.92, BUN 20, Potassium 4.2, Sodium 136, GFR 78-95 (received fax from Dr Carlyle Lipa office 3/3)  Recommendations:No change and encouraged to call if he develops any fluid symptoms.   Follow-up plan: ICM clinic phone appointment on6/15/2020.   Copy of ICM check sent to Dr.Allred.   3 month ICM trend: 06/20/2018    1 Year ICM trend:       Rosalene Billings, RN 06/20/2018 4:38 PM

## 2018-06-20 NOTE — Telephone Encounter (Signed)
Spoke with patient to remind of missed remote transmission 

## 2018-06-29 ENCOUNTER — Other Ambulatory Visit: Payer: Self-pay | Admitting: Internal Medicine

## 2018-06-29 NOTE — Telephone Encounter (Signed)
Last OV 06/14/2018 Scr 0.92 on 04/07/2018 crcl 48ml/min xarelto 20mg  sent to pharmacy

## 2018-06-30 NOTE — Progress Notes (Signed)
Remote pacemaker transmission.   

## 2018-07-05 ENCOUNTER — Other Ambulatory Visit (HOSPITAL_COMMUNITY): Payer: Medicare Other

## 2018-07-18 ENCOUNTER — Telehealth: Payer: Self-pay | Admitting: Neurology

## 2018-07-18 ENCOUNTER — Encounter: Payer: Self-pay | Admitting: Neurology

## 2018-07-18 NOTE — Telephone Encounter (Signed)
Verified chart and meds and everything is now up to date

## 2018-07-18 NOTE — Telephone Encounter (Signed)
Called and reviewed the chart with his daughter. The patient has not had any SZ's noted. She states that he uses a walker now after a previous fall where he did have a fx. No surgery was needed. She states memory comes and goes. They did receive the link and will be ready for the visit.

## 2018-07-20 ENCOUNTER — Encounter: Payer: Self-pay | Admitting: Neurology

## 2018-07-20 ENCOUNTER — Other Ambulatory Visit: Payer: Self-pay

## 2018-07-20 ENCOUNTER — Ambulatory Visit (INDEPENDENT_AMBULATORY_CARE_PROVIDER_SITE_OTHER): Payer: Medicare Other | Admitting: Neurology

## 2018-07-20 DIAGNOSIS — G459 Transient cerebral ischemic attack, unspecified: Secondary | ICD-10-CM

## 2018-07-20 DIAGNOSIS — R569 Unspecified convulsions: Secondary | ICD-10-CM

## 2018-07-20 DIAGNOSIS — I5022 Chronic systolic (congestive) heart failure: Secondary | ICD-10-CM

## 2018-07-20 DIAGNOSIS — R42 Dizziness and giddiness: Secondary | ICD-10-CM | POA: Diagnosis not present

## 2018-07-20 DIAGNOSIS — I4819 Other persistent atrial fibrillation: Secondary | ICD-10-CM | POA: Diagnosis not present

## 2018-07-20 NOTE — Patient Instructions (Addendum)
Memantine Tablets  What is this medicine?  MEMANTINE (MEM an teen) is used to treat dementia caused by Alzheimer's disease.  This medicine may be used for other purposes; ask your health care provider or pharmacist if you have questions.  COMMON BRAND NAME(S): Namenda  What should I tell my health care provider before I take this medicine?  They need to know if you have any of these conditions:  -difficulty passing urine  -kidney disease  -liver disease  -seizures  -an unusual or allergic reaction to memantine, other medicines, foods, dyes, or preservatives  -pregnant or trying to get pregnant  -breast-feeding  How should I use this medicine?  Take this medicine by mouth with a glass of water. Follow the directions on the prescription label. You may take this medicine with or without food. Take your doses at regular intervals. Do not take your medicine more often than directed. Continue to take your medicine even if you feel better. Do not stop taking except on the advice of your doctor or health care professional.  Talk to your pediatrician regarding the use of this medicine in children. Special care may be needed.  Overdosage: If you think you have taken too much of this medicine contact a poison control center or emergency room at once.  NOTE: This medicine is only for you. Do not share this medicine with others.  What if I miss a dose?  If you miss a dose, take it as soon as you can. If it is almost time for your next dose, take only that dose. Do not take double or extra doses. If you do not take your medicine for several days, contact your health care provider. Your dose may need to be changed.  What may interact with this medicine?  -acetazolamide  -amantadine  -cimetidine  -dextromethorphan  -dofetilide  -hydrochlorothiazide  -ketamine  -metformin  -methazolamide  -quinidine  -ranitidine  -sodium bicarbonate  -triamterene  This list may not describe all possible interactions. Give your health care provider a  list of all the medicines, herbs, non-prescription drugs, or dietary supplements you use. Also tell them if you smoke, drink alcohol, or use illegal drugs. Some items may interact with your medicine.  What should I watch for while using this medicine?  Visit your doctor or health care professional for regular checks on your progress. Check with your doctor or health care professional if there is no improvement in your symptoms or if they get worse.  You may get drowsy or dizzy. Do not drive, use machinery, or do anything that needs mental alertness until you know how this drug affects you. Do not stand or sit up quickly, especially if you are an older patient. This reduces the risk of dizzy or fainting spells. Alcohol can make you more drowsy and dizzy. Avoid alcoholic drinks.  What side effects may I notice from receiving this medicine?  Side effects that you should report to your doctor or health care professional as soon as possible:  -allergic reactions like skin rash, itching or hives, swelling of the face, lips, or tongue  -agitation or a feeling of restlessness  -depressed mood  -dizziness  -hallucinations  -redness, blistering, peeling or loosening of the skin, including inside the mouth  -seizures  -vomiting  Side effects that usually do not require medical attention (report to your doctor or health care professional if they continue or are bothersome):  -constipation  -diarrhea  -headache  -nausea  -trouble sleeping  This   F). Throw away any unused medicine after the expiration date. NOTE: This sheet is a summary. It may not cover all possible information. If you have questions about this  medicine, talk to your doctor, pharmacist, or health care provider.  2019 Elsevier/Gold Standard (2012-11-13 14:10:42)     Plan:   Stop Namenda for 7 days and see if gait is more stabilzed.

## 2018-07-20 NOTE — Progress Notes (Signed)
PATIENT: Thomas Long DOB: 08-27-1931  REASON FOR VISIT: follow up HISTORY FROM: patient  Virtual Visit via Video Note  I connected with [name] on 07/20/18 at 10:30 AM EDT by a video enabled telemedicine application and verified that I am speaking with the correct person using two identifiers.  Location: Patient: at home with daughter [name] Provider: at [facility]   I discussed the limitations of evaluation and management by telemedicine and the availability of in person appointments. The patient expressed understanding and agreed to proceed.  PCP Dr Felipa Eth.    RV 07/20/18:  There was a lot of interval developments- patient much more sleepy in daytime, and active nocturnally. He has a hard time coming up with words, is observing his surroundings, less participation. He had falls. One in late January and early February.   There was L 1 Compression fracture, found after first fall - following fall caused back pain, less movement- has taken Tylenol for 10 days and recovered quickly - needs walker now. Fall 1.happened at night on the way to the restroom, fell on his side. 2. Fall happened in daytime. He was dehydrated and lightheaded. He has already hypotension with CHF.  He became very dysphonic- and his face masked.      HISTORY OF PRESENT ILLNESS:Thomas Long is an 85 year old male with a history of seizures, stroke and memory disturbance.  He returns today for follow-up.  He states that has not had any seizure events.  He remains on Keppra.  His wife feels that his memory has gotten slightly worse.  She notices that he seems more confused when they have been busy throughout the day.  She states that he typically tolerates 1 activity but anything much more than that makes him very tired which then affects his memory.  He continues to complete all ADLs independently.  He does not operate a motor vehicle.  His wife manages his medications, appointments and finances.  He denies any  trouble sleeping.  Denies hallucinations.  He continues on Namenda 10 mg twice a day.  He returns today for follow-up.  HISTORY 07/11/17:  Thomas Long is an 85 year old male with a history of seizures, stroke and memory disturbance.  He returns today for follow-up.  He remains on Keppra extended release 500 mg daily.  He reports that he tolerates this well.  He denies any seizure events.  His wife reports that his memory is up and down.  She feels that this is related to his blood pressure.  He remains on Namenda.  He lives at home with his wife.  He is able to complete all ADLs independently.  He no longer operates a motor vehicle  His wife  manages hismedications.  Reports that he is up and down most the night.  But he does take several naps throughout the day.  Denies any changes with his mood or behavior.  Reports poor appetite.  He remains on Xarelto for stroke prevention.  Blood pressure within normal range today.  He continues to follow with his cardiologist.  He reports that he has been having some issues with his blood pressure as well as pacemaker.  He is following with Dr. Rayann Heman.  He returns today for an evaluation.   REVIEW OF SYSTEMS: Out of a complete 14 system review of symptoms, the patient complains only of the following symptoms, and all other reviewed systems are negative.  Joint pain, falls, light headedness.  He resists water intake-  Dysphonia  Low volume  Masked face.  Memory loss,  Speech is sparse. Hesitant.     Remaining  on Xeralto and falls--  ALLERGIES: No Known Allergies  HOME MEDICATIONS: Outpatient Medications Prior to Visit  Medication Sig Dispense Refill  . acetaminophen (TYLENOL) 500 MG tablet Take 500 mg by mouth every 6 (six) hours as needed for moderate pain or headache.    Marland Kitchen atorvastatin (LIPITOR) 10 MG tablet TAKE 1 TABLET DAILY 90 tablet 2  . cetirizine (ZYRTEC) 10 MG tablet Take 10 mg by mouth daily.    Marland Kitchen dofetilide (TIKOSYN) 250 MCG capsule TAKE  ONE CAPSULE BY MOUTH TWICE A DAY 180 capsule 3  . latanoprost (XALATAN) 0.005 % ophthalmic solution Place 1 drop into both eyes at bedtime.   3  . levETIRAcetam (KEPPRA XR) 500 MG 24 hr tablet Take 1 tablet (500 mg total) by mouth daily. 90 tablet 3  . memantine (NAMENDA) 10 MG tablet Take 1 tablet (10 mg total) by mouth 2 (two) times daily. 180 tablet 3  . metoprolol succinate (TOPROL XL) 25 MG 24 hr tablet Take 1 tablet (25 mg total) by mouth daily. 30 tablet 11  . Multiple Vitamins-Minerals (CENTRUM SILVER ADULT 50+ PO) Take 1 capsule by mouth daily.    Alveda Reasons 20 MG TABS tablet TAKE 1 TABLET DAILY WITH   SUPPER 90 tablet 1   No facility-administered medications prior to visit.     PAST MEDICAL HISTORY: Past Medical History:  Diagnosis Date  . CAD (coronary artery disease)    a. LHC 10/2014: nonobstructive disease, 30% LAD with bridging up to 50%.  . CHF (congestive heart failure) ([place]) 2016   "this was the reason for his pacemaker" (08/23/2017)  . Chronic pain   . Complete heart block ([place]) 11/07/2014   a. s/p [facility] Quadra Allure MP RF model [id number] (serial number [id number]) biventricular pacemaker 10/2014.  Marland Kitchen Dementia ([place])    "memory lapses q now and then; dx'd as delayed memory" (08/23/2017)  . Depression   . Facial cellulitis 02/19/2009   "related to [facility] & having skin cancer zapped; took him off the [place]"  . History of hiatal hernia 1980s  . Hyperlipidemia   . Hypertension   . NICM (nonischemic cardiomyopathy) ([place])    a. 10/2014: EF 45%. (Prev 40-45% by echo 08/2014).  . Orthostatic hypotension   . Presence of permanent cardiac pacemaker   . Rheumatoid arthritis ([place])    "hands mainly; knees"  (08/23/2017)  . Seizures ([place]) 2008   "vs stroke; never determined which it was; on sz RX since"  (08/23/2017)  . Sleep apnea 2008   "gone since losing weight" (08/23/2017)  . Stroke [facility]) 2008   "vs seizure; never determined which it was; on sz RX since"   (08/23/2017)  .  TIA (transient ischemic attack) 2008   "vs seizure; never determined which it was; on sz RX since"   (08/23/2017)    PAST SURGICAL HISTORY: Past Surgical History:  Procedure Laterality Date  . APPENDECTOMY  ~ 1988  . CARDIAC CATHETERIZATION  04/01/09   which showed low normal EF at 50% with question of underlying borderline area of minimal inferoapical hypercontractility. He had mild coronary obstructive disease with coronary calcification and segmental 20% narrowing in the LAD proximally, 20% in the mid LAD with muscle bridging. There was calcification at the ostium of the RCA, and 20 to 30%narrowing of the proximal to mid RCA with 30% distal n  . CARDIAC CATHETERIZATION  N/A 11/07/2014   Procedure: Left Heart Cath and Coronary Angiography/ temp wire;  Surgeon: [name], MD;  Location: [facility];  Service: Cardiovascular;  Laterality: N/A;  . CARDIOVERSION N/A 07/19/2017   Procedure: CARDIOVERSION;  Surgeon: Acie Fredrickson Wonda Cheng, MD;  Location: [facility];  Service: Cardiovascular;  Laterality: N/A;  . CATARACT EXTRACTION W/ INTRAOCULAR LENS  IMPLANT, BILATERAL Bilateral early 2000's  . CHOLECYSTECTOMY OPEN  1980's  . EP IMPLANTABLE DEVICE N/A 11/08/2014   a. STJ CRTP implanted by Dr Rayann Heman  . EP IMPLANTABLE DEVICE N/A 11/10/2014   RA lead revision Dr Rayann Heman  . EXPLORATORY LAPAROTOMY  1990's   "put intestines back in & added mesh"  . FALSE ANEURYSM REPAIR Right 11/15/2014   Procedure: REPAIR OF RIGHT FEMORAL FALSE ANEURYSM ;  Surgeon: Rosetta Posner, MD;  Location: [facility];  Service: Vascular;  Laterality: Right;  . HERNIA REPAIR  1990s  . KNEE CARTILAGE SURGERY Left 1990's   "opened it up"  . TONSILLECTOMY      FAMILY HISTORY: Family History  Problem Relation Age of Onset  . Heart attack Father   . Cancer - Lung Sister   . Thyroid disease Child     SOCIAL HISTORY: Social History   Socioeconomic History  . Marital status: Married    Spouse name: [name]  . Number of  children: 5  . Years of education: College  . Highest education level: Not on file  Occupational History  . Not on file  Social Needs  . Financial resource strain: Not on file  . Food insecurity    Worry: Not on file    Inability: Not on file  . Transportation needs    Medical: Not on file    Non-medical: Not on file  Tobacco Use  . Smoking status: Never Smoker  . Smokeless tobacco: Never Used  Substance and Sexual Activity  . Alcohol use: Not Currently    Alcohol/week: 0.0 standard drinks    Frequency: Never  . Drug use: Never  . Sexual activity: Not on file  Lifestyle  . Physical activity    Days per week: Not on file    Minutes per session: Not on file  . Stress: Not on file  Relationships  . Social Herbalist on phone: Not on file    Gets together: Not on file    Attends religious service: Not on file    Active member of club or organization: Not on file    Attends meetings of clubs or organizations: Not on file    Relationship status: Not on file  . Intimate partner violence    Fear of current or ex partner: Not on file    Emotionally abused: Not on file    Physically abused: Not on file    Forced sexual activity: Not on file  Other Topics Concern  . Not on file  Social History Narrative   Patient is married [name]) and lives at home with wife and two children.   Patient has five adult children.   Patient is retired.   Patient has a college education.   Patient is right-handed.   Patient drinks two cups of coffee daily, 1/2 can of soda daily and tea- 2-3 times per week.      PHYSICAL EXAM  There were no vitals filed for this visit. There is no height or weight on file to calculate BMI.  MMSE - Mini Mental State Exam 01/18/2018 07/11/2017  07/06/2016  Not completed: (No Data) - -  Orientation to time 1 3 4   Orientation to Place 4 3 5   Registration 3 3 3   Attention/ Calculation 1 2 4   Recall 0 1 0  Language- name 2 objects 2 2 2   Language-  repeat 1 1 1   Language- follow 3 step command 0 3 2  Language- read & follow direction 0 1 1  Write a sentence 1 1 1   Copy design 1 0 0  Total score 14 20 23       Lab Results  Component Value Date   WBC 8.2 07/07/2017   HGB 12.1 (L) 07/07/2017   HCT 35.2 (L) 07/07/2017   MCV 92 07/07/2017   PLT 323 07/07/2017      Component Value Date/Time   NA 138 01/09/2018 1034   K 4.7 01/09/2018 1034   CL 98 01/09/2018 1034   CO2 24 01/09/2018 1034   GLUCOSE 108 (H) 01/09/2018 1034   GLUCOSE 135 (H) 09/02/2017 1143   BUN 21 01/09/2018 1034   CREATININE 1.08 01/09/2018 1034   CREATININE 1.18 (H) 12/25/2014 1605   CALCIUM 9.2 01/09/2018 1034   PROT 6.4 04/11/2017 1525   ALBUMIN 3.7 04/11/2017 1525   AST 19 04/11/2017 1525   ALT 14 04/11/2017 1525   ALKPHOS 78 04/11/2017 1525   BILITOT 0.6 04/11/2017 1525   GFRNONAA 63 01/09/2018 1034   GFRAA 72 01/09/2018 1034   Lab Results  Component Value Date   CHOL 127 10/24/2014   HDL 42 10/24/2014   LDLCALC 62 10/24/2014   TRIG 113 10/24/2014   CHOLHDL 3.0 10/24/2014    Lab Results  Component Value Date   TSH 1.210 04/11/2017     118/ 71 mmHg , Pulse 71. Taken by daughter   Observations/Objective: likely further progression of memory loss, this is not longer MCI but clearly dementia.  No facial droop, but rare blinking noted.  There is a masked face, frequent falls, dysautonomia or cardiac CHF related hypotension, lightheadedness. Chronically anticoagulated.  Reversed circadian rhythm. Sundowning and excessive daytime sleepiness due to underlying medical condition. .     ASSESSMENT AND PLAN 85 y.o. year old male  has a past medical history of CAD (coronary artery disease), CHF (congestive heart failure) ([place]) (2016), Chronic pain, Complete heart block ([place]) (11/07/2014), Dementia ([place]), Depression, Facial cellulitis (02/19/2009), History of hiatal hernia (1980s), Hyperlipidemia, Hypertension, NICM (nonischemic cardiomyopathy)  ([place]), Orthostatic hypotension, Presence of permanent cardiac pacemaker, Rheumatoid arthritis ([place]), Seizures ([place]) (2008), Sleep apnea (2008), Stroke [facility]) (2008), and TIA (transient ischemic attack) (2008). here with: Assessment and Plan:  Encourage fluid intake. next visit with face to face with NP or me. Needs orthostatics, and gait test. I see some parkinsonism here. Dementia.     Follow Up Instructions: RV in 2-4 month     I discussed the assessment and treatment plan with the patient. The patient was provided an opportunity to ask questions and all were answered. The patient agreed with the plan and demonstrated an understanding of the instructions.   The patient was advised to call back or seek an in-person evaluation if the symptoms worsen or if the condition fails to improve as anticipated.  I provided 25 minutes of non-face-to-face time during this encounter.   Larey Seat, MD    07/20/2018, 10:43 AM [facility] [address], [place], [place] [address] [phone]

## 2018-07-25 ENCOUNTER — Telehealth: Payer: Self-pay

## 2018-07-25 NOTE — Telephone Encounter (Signed)
Left message for patient to remind of missed remote transmission.  

## 2018-08-02 ENCOUNTER — Encounter: Payer: Self-pay | Admitting: Neurology

## 2018-08-07 ENCOUNTER — Other Ambulatory Visit: Payer: Self-pay | Admitting: Physician Assistant

## 2018-08-07 NOTE — Progress Notes (Signed)
No ICM remote transmission received for 07/24/2018 and next ICM transmission scheduled for 08/16/2018.

## 2018-08-16 NOTE — Progress Notes (Signed)
No ICM remote transmission received for 08/16/2018 and next ICM transmission scheduled for 09/11/2018.

## 2018-09-11 ENCOUNTER — Ambulatory Visit (INDEPENDENT_AMBULATORY_CARE_PROVIDER_SITE_OTHER): Payer: Medicare Other

## 2018-09-11 DIAGNOSIS — Z950 Presence of cardiac pacemaker: Secondary | ICD-10-CM

## 2018-09-11 DIAGNOSIS — I5042 Chronic combined systolic (congestive) and diastolic (congestive) heart failure: Secondary | ICD-10-CM

## 2018-09-12 ENCOUNTER — Telehealth: Payer: Self-pay

## 2018-09-12 NOTE — Telephone Encounter (Signed)
Left message for patient to remind of missed remote transmission.  

## 2018-09-15 NOTE — Progress Notes (Signed)
EPIC Encounter for ICM Monitoring  Patient Name: Thomas Long is a 85 y.o. male Date: 09/15/2018 Primary Care Physican: Lajean Manes, MD Primary [place] Electrophysiologist: Allred Bi-V Pacing:>99%   Transmission reviewed  Thoracic impedancenormal (change in reference and daily impedance line starting 03/2018).  No diuretic.  LABS: 04/07/2018 Creatinine 0.92, BUN 20, Potassium 4.2, Sodium 136, GFR 78-95 (received fax from Dr Carlyle Lipa office 3/3)  Recommendations:None  Follow-up plan: ICM clinic phone appointment on 10/18/2018.   Copy of ICM check sent to Dr.Allred.   3 month ICM trend: 09/11/2018    1 Year ICM trend:       Rosalene Billings, RN 09/15/2018 5:04 PM

## 2018-10-18 ENCOUNTER — Ambulatory Visit (INDEPENDENT_AMBULATORY_CARE_PROVIDER_SITE_OTHER): Payer: Medicare Other | Admitting: *Deleted

## 2018-10-18 DIAGNOSIS — I4819 Other persistent atrial fibrillation: Secondary | ICD-10-CM | POA: Diagnosis not present

## 2018-10-18 DIAGNOSIS — I5022 Chronic systolic (congestive) heart failure: Secondary | ICD-10-CM

## 2018-10-19 LAB — CUP PACEART REMOTE DEVICE CHECK
Battery Remaining Longevity: 47 mo
Battery Remaining Percentage: 71 %
Battery Voltage: 2.92 V
Brady Statistic AP VP Percent: 1 %
Brady Statistic AP VS Percent: 1 %
Brady Statistic AS VP Percent: 99 %
Brady Statistic AS VS Percent: 1 %
Brady Statistic RA Percent Paced: 1 %
Brady Statistic RV Percent Paced: 99 %
Date Time Interrogation Session: 20200910092739
Implantable Lead Implant Date: 20160930
Implantable Lead Implant Date: 20160930
Implantable Lead Implant Date: 20160930
Implantable Lead Location: 753858
Implantable Lead Location: 753859
Implantable Lead Location: 753860
Implantable Pulse Generator Implant Date: 20160930
Lead Channel Impedance Value: 360 Ohm
Lead Channel Impedance Value: 630 Ohm
Lead Channel Impedance Value: 680 Ohm
Lead Channel Pacing Threshold Amplitude: 0.75 V
Lead Channel Pacing Threshold Amplitude: 1.75 V
Lead Channel Pacing Threshold Pulse Width: 0.5 ms
Lead Channel Pacing Threshold Pulse Width: 0.8 ms
Lead Channel Sensing Intrinsic Amplitude: 2.2 mV
Lead Channel Sensing Intrinsic Amplitude: 7.2 mV
Lead Channel Setting Pacing Amplitude: 2 V
Lead Channel Setting Pacing Amplitude: 2 V
Lead Channel Setting Pacing Amplitude: 2.75 V
Lead Channel Setting Pacing Pulse Width: 0.5 ms
Lead Channel Setting Pacing Pulse Width: 0.8 ms
Lead Channel Setting Sensing Sensitivity: 5 mV
Pulse Gen Model: 3262
Pulse Gen Serial Number: 7798436

## 2018-10-20 ENCOUNTER — Ambulatory Visit (INDEPENDENT_AMBULATORY_CARE_PROVIDER_SITE_OTHER): Payer: Medicare Other

## 2018-10-20 DIAGNOSIS — I5042 Chronic combined systolic (congestive) and diastolic (congestive) heart failure: Secondary | ICD-10-CM

## 2018-10-20 DIAGNOSIS — Z950 Presence of cardiac pacemaker: Secondary | ICD-10-CM

## 2018-10-20 NOTE — Progress Notes (Signed)
EPIC Encounter for ICM Monitoring  Patient Name: Thomas Long is a 85 y.o. male Date: 10/20/2018 Primary Care Physican: Lajean Manes, MD Primary [place] Electrophysiologist: Allred Bi-V Pacing:>99%   Transmission reviewed  Thoracic impedancenormal (change in reference and daily impedance line starting 03/2018).  No diuretic.  LABS: 04/07/2018 Creatinine 0.92, BUN 20, Potassium 4.2, Sodium 136, GFR 78-95 (received fax from Dr Carlyle Lipa office 3/3)  Recommendations:None  Follow-up plan: ICM clinic phone appointment on 12/04/2018. Message sent to scheduler to call patient for OV with Dr Rayann Heman (last visit 01/2018)  Copy of ICM check sent to Dr.Allred.   3 month ICM trend: 10/19/2018    1 Year ICM trend:       Rosalene Billings, RN 10/20/2018 2:06 PM

## 2018-10-31 ENCOUNTER — Other Ambulatory Visit: Payer: Self-pay | Admitting: Physician Assistant

## 2018-11-02 NOTE — Progress Notes (Signed)
Remote pacemaker transmission.   

## 2018-11-02 NOTE — Progress Notes (Signed)
Electrophysiology Office Note Date: 11/02/2018  ID:  [name], DOB [birth year], MRN [id number]  PCP: Lajean Manes, MD Primary Cardiologist: No primary care provider on file. Electrophysiologist: [name], MD  CC: Pacemaker follow-up  Thomas Long is a 85 y.o. male seen today for Dr. Rayann Heman.  He presents today for routine electrophysiology followup.  Since last being seen in our clinic, the patient reports doing well overall. He had several falls in [place] and feb of this year. Per daughter his energy has gradually declined over the past 9-12 months.  He has an echo scheduled for November, and follow up with Dr. Claiborne Billings in December. He denies chest pain, palpitations, dyspnea, PND, orthopnea, nausea, vomiting, dizziness, syncope, edema, weight gain, or early satiety.  Device History: St. Jude BiV PPM implanted 2016 for CHB, CHF  Past Medical History:  Diagnosis Date  . CAD (coronary artery disease)    a. LHC 10/2014: nonobstructive disease, 30% LAD with bridging up to 50%.  . CHF (congestive heart failure) ([place]) 2016   "this was the reason for his pacemaker" (08/23/2017)  . Chronic pain   . Complete heart block ([place]) 11/07/2014   a. s/p [facility] Quadra Allure MP RF model [id number] (serial number [id number]) biventricular pacemaker 10/2014.  Marland Kitchen Dementia ([place])    "memory lapses q now and then; dx'd as delayed memory" (08/23/2017)  . Depression   . Facial cellulitis 02/19/2009   "related to [facility] & having skin cancer zapped; took him off the Embrel"  . History of hiatal hernia 1980s  . Hyperlipidemia   . Hypertension   . NICM (nonischemic cardiomyopathy) ([place])    a. 10/2014: EF 45%. (Prev 40-45% by echo 08/2014).  . Orthostatic hypotension   . Presence of permanent cardiac pacemaker   . Rheumatoid arthritis ([place])    "hands mainly; knees"  (08/23/2017)  . Seizures ([place]) 2008   "vs stroke; never determined which it was; on sz RX since"  (08/23/2017)  . Sleep apnea 2008   "gone  since losing weight" (08/23/2017)  . Stroke [facility]) 2008   "vs seizure; never determined which it was; on sz RX since"   (08/23/2017)  . TIA (transient ischemic attack) 2008   "vs seizure; never determined which it was; on sz RX since"   (08/23/2017)   Past Surgical History:  Procedure Laterality Date  . APPENDECTOMY  ~ 1988  . CARDIAC CATHETERIZATION  04/01/09   which showed low normal EF at 50% with question of underlying borderline area of minimal inferoapical hypercontractility. He had mild coronary obstructive disease with coronary calcification and segmental 20% narrowing in the LAD proximally, 20% in the mid LAD with muscle bridging. There was calcification at the ostium of the RCA, and 20 to 30%narrowing of the proximal to mid RCA with 30% distal n  . CARDIAC CATHETERIZATION N/A 11/07/2014   Procedure: Left Heart Cath and Coronary Angiography/ temp wire;  Surgeon: [name], MD;  Location: [place] CV [facility];  Service: Cardiovascular;  Laterality: N/A;  . CARDIOVERSION N/A 07/19/2017   Procedure: CARDIOVERSION;  Surgeon: Acie Fredrickson Wonda Cheng, MD;  Location: [place];  Service: Cardiovascular;  Laterality: N/A;  . CATARACT EXTRACTION W/ INTRAOCULAR LENS  IMPLANT, BILATERAL Bilateral early 2000's  . CHOLECYSTECTOMY OPEN  1980's  . EP IMPLANTABLE DEVICE N/A 11/08/2014   a. STJ CRTP implanted by Dr Rayann Heman  . EP IMPLANTABLE DEVICE N/A 11/10/2014   RA lead revision Dr Rayann Heman  . EXPLORATORY LAPAROTOMY  1990's   "put intestines back in & added mesh"  . FALSE ANEURYSM REPAIR Right 11/15/2014   Procedure: REPAIR OF RIGHT FEMORAL FALSE ANEURYSM ;  Surgeon: Rosetta Posner, MD;  Location: [place];  Service: Vascular;  Laterality: Right;  . HERNIA REPAIR  1990s  . KNEE CARTILAGE SURGERY Left 1990's   "opened it up"  . TONSILLECTOMY      Current Outpatient Medications  Medication Sig Dispense Refill  . acetaminophen (TYLENOL) 500 MG tablet Take 500 mg by mouth every 6 (six) hours as needed for  moderate pain or headache.    Marland Kitchen atorvastatin (LIPITOR) 10 MG tablet TAKE 1 TABLET DAILY 90 tablet 2  . cetirizine (ZYRTEC) 10 MG tablet Take 10 mg by mouth daily.    Marland Kitchen dofetilide (TIKOSYN) 250 MCG capsule TAKE ONE CAPSULE BY MOUTH TWICE A DAY 180 capsule 3  . latanoprost (XALATAN) 0.005 % ophthalmic solution Place 1 drop into both eyes at bedtime.   3  . levETIRAcetam (KEPPRA XR) 500 MG 24 hr tablet Take 1 tablet (500 mg total) by mouth daily. 90 tablet 3  . memantine (NAMENDA) 10 MG tablet Take 1 tablet (10 mg total) by mouth 2 (two) times daily. 180 tablet 3  . metoprolol succinate (TOPROL-XL) 25 MG 24 hr tablet Take 1 tablet (25 mg total) by mouth daily. Please make yearly appt with Dr. Rayann Heman for December for future refills. 1st attempt 90 tablet 0  . Multiple Vitamins-Minerals (CENTRUM SILVER ADULT 50+ PO) Take 1 capsule by mouth daily.    Alveda Reasons 20 MG TABS tablet TAKE 1 TABLET DAILY WITH   SUPPER 90 tablet 1   No current facility-administered medications for this visit.     Allergies:   Patient has no known allergies.   Social History: Social History   Socioeconomic History  . Marital status: Married    Spouse name: [name]  . Number of children: 5  . Years of education: College  . Highest education level: Not on file  Occupational History  . Not on file  Social Needs  . Financial resource strain: Not on file  . Food insecurity    Worry: Not on file    Inability: Not on file  . Transportation needs    Medical: Not on file    Non-medical: Not on file  Tobacco Use  . Smoking status: Never Smoker  . Smokeless tobacco: Never Used  Substance and Sexual Activity  . Alcohol use: Not Currently    Alcohol/week: 0.0 standard drinks    Frequency: Never  . Drug use: Never  . Sexual activity: Not on file  Lifestyle  . Physical activity    Days per week: Not on file    Minutes per session: Not on file  . Stress: Not on file  Relationships  . Social Herbalist on  phone: Not on file    Gets together: Not on file    Attends religious service: Not on file    Active member of club or organization: Not on file    Attends meetings of clubs or organizations: Not on file    Relationship status: Not on file  . Intimate partner violence    Fear of current or ex partner: Not on file    Emotionally abused: Not on file    Physically abused: Not on file    Forced sexual activity: Not on file  Other Topics Concern  . Not on file  Social History  Narrative   Patient is married [name]) and lives at home with wife and two children.   Patient has five adult children.   Patient is retired.   Patient has a college education.   Patient is right-handed.   Patient drinks two cups of coffee daily, 1/2 can of soda daily and tea- 2-3 times per week.    Family History: Family History  Problem Relation Age of Onset  . Heart attack Father   . Cancer - Lung Sister   . Thyroid disease Child      Review of Systems: All other systems reviewed and are otherwise negative except as noted above.  Physical Exam: There were no vitals filed for this visit.   GEN- The patient is well appearing, alert and oriented x 3 today.   HEENT: normocephalic, atraumatic; sclera clear, conjunctiva pink; hearing intact; oropharynx clear; neck supple  Lungs- Clear to ausculation bilaterally, normal work of breathing.  No wheezes, rales, rhonchi Heart- Regular rate and rhythm, no murmurs, rubs or gallops  GI- soft, non-tender, non-distended, bowel sounds present  Extremities- no clubbing, cyanosis, or edema  MS- no significant deformity or atrophy Skin- warm and dry, no rash or lesion; PPM pocket well healed Psych- euthymic mood, full affect Neuro- strength and sensation are intact  PPM Interrogation- reviewed in detail today,  See PACEART report  EKG:  EKG is ordered today. The ekg ordered today shows A-sensed V paced rhythm with PVCs. Rate 94 bpm.  QTc calculated personally and  ~470 when corrected for rate and paced QRS.   Recent Labs: 01/09/2018: BUN 21; Creatinine, Ser 1.08; Magnesium 2.1; Potassium 4.7; Sodium 138   Wt Readings from Last 3 Encounters:  06/14/18 193 lb (87.5 kg)  01/18/18 205 lb (93 kg)  01/09/18 201 lb 6.4 oz (91.4 kg)     Other studies Reviewed: Additional studies/ records that were reviewed today include: Previous EP office notes, previous remote checks, Echo 04/2017 LVEF 50-55%   Assessment and Plan:  1.  Symptomatic CHB s/p St. Jude PPM  Normal PPM function See Pace Art report No changes today  2. PAF AF burden is <1% on tikosyn BMET/Mg today.  On Xarelto for CHA2DS2VASC of 5. CBC today.   3. Chronic systolic dysfunction EF improved to 50-55% by Echo 04/2017 He has repeat echo pending in November for follow up with Dr. Claiborne Billings.   4. Fatigue Suspect this is multifactorial. He had several falls earlier this year and has never fully picked back up. Now walking with a walker. No increased AF burden. Repeat echo pending.  Labwork today.  Encouraged hydration and nutrition.   Current medicines are reviewed at length with the patient today.   The patient does not have concerns regarding his medicines.  The following changes were made today:  none  Labs/ tests ordered today include:  No orders of the defined types were placed in this encounter.   Disposition:  Plan follow up with EP APP q 6 months.    Jacalyn Lefevre, PA-C  11/02/2018 1:58 PM  [place] [address] [phone] (office) [phone] (fax)

## 2018-11-03 ENCOUNTER — Ambulatory Visit (INDEPENDENT_AMBULATORY_CARE_PROVIDER_SITE_OTHER): Payer: Medicare Other | Admitting: Student

## 2018-11-03 ENCOUNTER — Other Ambulatory Visit: Payer: Self-pay

## 2018-11-03 VITALS — BP 108/54 | HR 94 | Ht 68.0 in | Wt 184.0 lb

## 2018-11-03 DIAGNOSIS — I10 Essential (primary) hypertension: Secondary | ICD-10-CM

## 2018-11-03 DIAGNOSIS — I4819 Other persistent atrial fibrillation: Secondary | ICD-10-CM | POA: Diagnosis not present

## 2018-11-03 DIAGNOSIS — I5022 Chronic systolic (congestive) heart failure: Secondary | ICD-10-CM

## 2018-11-03 DIAGNOSIS — I442 Atrioventricular block, complete: Secondary | ICD-10-CM

## 2018-11-03 LAB — CUP PACEART INCLINIC DEVICE CHECK
Battery Remaining Longevity: 46 mo
Battery Voltage: 2.92 V
Brady Statistic RA Percent Paced: 0.11 %
Brady Statistic RV Percent Paced: 99.25 %
Date Time Interrogation Session: 20200925093118
Implantable Lead Implant Date: 20160930
Implantable Lead Implant Date: 20160930
Implantable Lead Implant Date: 20160930
Implantable Lead Location: 753858
Implantable Lead Location: 753859
Implantable Lead Location: 753860
Implantable Pulse Generator Implant Date: 20160930
Lead Channel Impedance Value: 362.5 Ohm
Lead Channel Impedance Value: 712.5 Ohm
Lead Channel Impedance Value: 737.5 Ohm
Lead Channel Pacing Threshold Amplitude: 0.75 V
Lead Channel Pacing Threshold Amplitude: 0.75 V
Lead Channel Pacing Threshold Amplitude: 0.75 V
Lead Channel Pacing Threshold Amplitude: 1.5 V
Lead Channel Pacing Threshold Amplitude: 1.5 V
Lead Channel Pacing Threshold Pulse Width: 0.5 ms
Lead Channel Pacing Threshold Pulse Width: 0.5 ms
Lead Channel Pacing Threshold Pulse Width: 0.5 ms
Lead Channel Pacing Threshold Pulse Width: 0.8 ms
Lead Channel Pacing Threshold Pulse Width: 0.8 ms
Lead Channel Sensing Intrinsic Amplitude: 2.9 mV
Lead Channel Sensing Intrinsic Amplitude: 7.7 mV
Lead Channel Setting Pacing Amplitude: 2 V
Lead Channel Setting Pacing Amplitude: 2 V
Lead Channel Setting Pacing Amplitude: 2.75 V
Lead Channel Setting Pacing Pulse Width: 0.5 ms
Lead Channel Setting Pacing Pulse Width: 0.8 ms
Lead Channel Setting Sensing Sensitivity: 5 mV
Pulse Gen Model: 3262
Pulse Gen Serial Number: 7798436

## 2018-11-03 LAB — CBC
Hematocrit: 37.7 % (ref 37.5–51.0)
Hemoglobin: 12.9 g/dL — ABNORMAL LOW (ref 13.0–17.7)
MCH: 32.2 pg (ref 26.6–33.0)
MCHC: 34.2 g/dL (ref 31.5–35.7)
MCV: 94 fL (ref 79–97)
Platelets: 308 10*3/uL (ref 150–450)
RBC: 4.01 x10E6/uL — ABNORMAL LOW (ref 4.14–5.80)
RDW: 11.9 % (ref 11.6–15.4)
WBC: 7.3 10*3/uL (ref 3.4–10.8)

## 2018-11-03 LAB — BASIC METABOLIC PANEL
BUN/Creatinine Ratio: 18 (ref 10–24)
BUN: 18 mg/dL (ref 8–27)
CO2: 26 mmol/L (ref 20–29)
Calcium: 9.2 mg/dL (ref 8.6–10.2)
Chloride: 98 mmol/L (ref 96–106)
Creatinine, Ser: 1 mg/dL (ref 0.76–1.27)
GFR calc Af Amer: 79 mL/min/{1.73_m2} (ref >59)
GFR calc non Af Amer: 68 mL/min/{1.73_m2} (ref >59)
Glucose: 109 mg/dL — ABNORMAL HIGH (ref 65–99)
Potassium: 4.4 mmol/L (ref 3.5–5.2)
Sodium: 137 mmol/L (ref 134–144)

## 2018-11-03 LAB — MAGNESIUM: Magnesium: 2.1 mg/dL (ref 1.6–2.3)

## 2018-11-03 NOTE — Patient Instructions (Signed)
Medication Instructions:  Your physician recommends that you continue on your current medications as directed. Please refer to the Current Medication list given to you today.  If you need a refill on your cardiac medications before your next appointment, please call your pharmacy.   Lab work:  BMET CBC AND MAG TODAY   If you have labs (blood work) drawn today and your tests are completely normal, you will receive your results only by: Marland Kitchen MyChart Message (if you have MyChart) OR . A paper copy in the mail If you have any lab test that is abnormal or we need to change your treatment, we will call you to review the results.  Testing/Procedures: NONE ORDERED  TODAY  Follow-Up: At [facility], you and your health needs are our priority.  As part of our continuing mission to provide you with exceptional heart care, we have created designated Provider Care Teams.  These Care Teams include your primary Cardiologist (physician) and Advanced Practice Providers (APPs -  Physician Assistants and Nurse Practitioners) who all work together to provide you with the care you need, when you need it. You will need a follow up appointment in 6 months.  Please call our office 2 months in advance to schedule this appointment.  You may see [name], MD or one of the following Advanced Practice Providers on your designated Care Team:   Chanetta Marshall, NP . Tommye Standard, PA-C . Donavan Burnet PA-C  Any Other Special Instructions Will Be Listed Below (If Applicable).

## 2018-11-15 ENCOUNTER — Ambulatory Visit: Payer: Medicare Other | Admitting: Adult Health

## 2018-11-15 ENCOUNTER — Encounter: Payer: Self-pay | Admitting: Adult Health

## 2018-11-15 ENCOUNTER — Other Ambulatory Visit: Payer: Self-pay

## 2018-11-15 VITALS — BP 106/67 | HR 84 | Temp 96.3°F | Ht 68.0 in | Wt 187.0 lb

## 2018-11-15 DIAGNOSIS — R413 Other amnesia: Secondary | ICD-10-CM

## 2018-11-15 DIAGNOSIS — R569 Unspecified convulsions: Secondary | ICD-10-CM

## 2018-11-15 NOTE — Patient Instructions (Signed)
Your Plan:  Continue namenda for memory Continue Keppra If your symptoms worsen or you develop new symptoms please let us know.   Thank you for coming to see [place] at [facility]. I hope we have been able to provide you high quality care today.  You may receive a patient satisfaction survey over the next few weeks. We would appreciate your feedback and comments so that we may continue to improve ourselves and the health of our patients.

## 2018-11-15 NOTE — Progress Notes (Signed)
PATIENT: Thomas Long DOB: 08-Mar-1931  REASON FOR VISIT: follow up HISTORY FROM: patient  HISTORY OF PRESENT ILLNESS: Today 11/15/18:  Thomas Long is an 85 year old male with a history of memory disturbance and seizures.  He returns today for follow-up.  He is here today with his wife and daughter.  Wife reports that he has not had any seizure events.  He continues to do well on Keppra.  Wife states that his memory fluctuates.  She states that he has good days and bad days.  She states that he sometimes has issues with word finding.  He requires minimal supervision with ADLs.  He does not operate a motor vehicle.  Overall she feels that he has remained relatively stable.  He returns today for an evaluation.  HISTORY (Copied from Dr.Dohmeier's note) There was a lot of interval developments- patient much more sleepy in daytime, and active nocturnally. He has a hard time coming up with words, is observing his surroundings, less participation. He had falls. One in late January and early February.   There was L 1 Compression fracture, found after first fall - following fall caused back pain, less movement- has taken Tylenol for 10 days and recovered quickly - needs walker now. Fall 1.happened at night on the way to the restroom, fell on his side. 2. Fall happened in daytime. He was dehydrated and lightheaded. He has already hypotension with CHF.  He became very dysphonic- and his face masked.      REVIEW OF SYSTEMS: Out of a complete 14 system review of symptoms, the patient complains only of the following symptoms, and all other reviewed systems are negative.  See HPI  ALLERGIES: No Known Allergies  HOME MEDICATIONS: Outpatient Medications Prior to Visit  Medication Sig Dispense Refill  . acetaminophen (TYLENOL) 500 MG tablet Take 500 mg by mouth every 6 (six) hours as needed for moderate pain or headache.    Marland Kitchen atorvastatin (LIPITOR) 10 MG tablet TAKE 1 TABLET DAILY 90 tablet 2  .  cetirizine (ZYRTEC) 10 MG tablet Take 10 mg by mouth daily.    Marland Kitchen dofetilide (TIKOSYN) 250 MCG capsule TAKE ONE CAPSULE BY MOUTH TWICE A DAY 180 capsule 3  . latanoprost (XALATAN) 0.005 % ophthalmic solution Place 1 drop into both eyes at bedtime.   3  . levETIRAcetam (KEPPRA XR) 500 MG 24 hr tablet Take 1 tablet (500 mg total) by mouth daily. 90 tablet 3  . memantine (NAMENDA) 10 MG tablet Take 1 tablet (10 mg total) by mouth 2 (two) times daily. 180 tablet 3  . metoprolol succinate (TOPROL-XL) 25 MG 24 hr tablet Take 1 tablet (25 mg total) by mouth daily. Please make yearly appt with Dr. Rayann Heman for December for future refills. 1st attempt 90 tablet 0  . Multiple Vitamins-Minerals (CENTRUM SILVER ADULT 50+ PO) Take 1 capsule by mouth daily.    Alveda Reasons 20 MG TABS tablet TAKE 1 TABLET DAILY WITH   SUPPER 90 tablet 1   No facility-administered medications prior to visit.     PAST MEDICAL HISTORY: Past Medical History:  Diagnosis Date  . CAD (coronary artery disease)    a. LHC 10/2014: nonobstructive disease, 30% LAD with bridging up to 50%.  . CHF (congestive heart failure) ([place]) 2016   "this was the reason for his pacemaker" (08/23/2017)  . Chronic pain   . Complete heart block ([place]) 11/07/2014   a. s/p [employer] Quadra Allure MP RF model [id number] (serial  number [id number]) biventricular pacemaker 10/2014.  Marland Kitchen Dementia ([place])    "memory lapses q now and then; dx'd as delayed memory" (08/23/2017)  . Depression   . Facial cellulitis 02/19/2009   "related to [facility] & having skin cancer zapped; took him off the Embrel"  . History of hiatal hernia 1980s  . Hyperlipidemia   . Hypertension   . NICM (nonischemic cardiomyopathy) ([place])    a. 10/2014: EF 45%. (Prev 40-45% by echo 08/2014).  . Orthostatic hypotension   . Presence of permanent cardiac pacemaker   . Rheumatoid arthritis ([place])    "hands mainly; knees"  (08/23/2017)  . Seizures ([place]) 2008   "vs stroke; never determined which it was; on sz  RX since"  (08/23/2017)  . Sleep apnea 2008   "gone since losing weight" (08/23/2017)  . Stroke [facility]) 2008   "vs seizure; never determined which it was; on sz RX since"   (08/23/2017)  . TIA (transient ischemic attack) 2008   "vs seizure; never determined which it was; on sz RX since"   (08/23/2017)    PAST SURGICAL HISTORY: Past Surgical History:  Procedure Laterality Date  . APPENDECTOMY  ~ 1988  . CARDIAC CATHETERIZATION  04/01/09   which showed low normal EF at 50% with question of underlying borderline area of minimal inferoapical hypercontractility. He had mild coronary obstructive disease with coronary calcification and segmental 20% narrowing in the LAD proximally, 20% in the mid LAD with muscle bridging. There was calcification at the ostium of the RCA, and 20 to 30%narrowing of the proximal to mid RCA with 30% distal n  . CARDIAC CATHETERIZATION N/A 11/07/2014   Procedure: Left Heart Cath and Coronary Angiography/ temp wire;  Surgeon: [name], MD;  Location: [place] [facility];  Service: Cardiovascular;  Laterality: N/A;  . CARDIOVERSION N/A 07/19/2017   Procedure: CARDIOVERSION;  Surgeon: Acie Fredrickson Wonda Cheng, MD;  Location: [place];  Service: Cardiovascular;  Laterality: N/A;  . CATARACT EXTRACTION W/ INTRAOCULAR LENS  IMPLANT, BILATERAL Bilateral early 2000's  . CHOLECYSTECTOMY OPEN  1980's  . EP IMPLANTABLE DEVICE N/A 11/08/2014   a. STJ CRTP implanted by Dr Rayann Heman  . EP IMPLANTABLE DEVICE N/A 11/10/2014   RA lead revision Dr Rayann Heman  . EXPLORATORY LAPAROTOMY  1990's   "put intestines back in & added mesh"  . FALSE ANEURYSM REPAIR Right 11/15/2014   Procedure: REPAIR OF RIGHT FEMORAL FALSE ANEURYSM ;  Surgeon: Rosetta Posner, MD;  Location: [place];  Service: Vascular;  Laterality: Right;  . HERNIA REPAIR  1990s  . KNEE CARTILAGE SURGERY Left 1990's   "opened it up"  . TONSILLECTOMY      FAMILY HISTORY: Family History  Problem Relation Age of Onset  . Heart attack Father    . Cancer - Lung Sister   . Thyroid disease Child     SOCIAL HISTORY: Social History   Socioeconomic History  . Marital status: Married    Spouse name: [name]  . Number of children: 5  . Years of education: College  . Highest education level: Not on file  Occupational History  . Not on file  Social Needs  . Financial resource strain: Not on file  . Food insecurity    Worry: Not on file    Inability: Not on file  . Transportation needs    Medical: Not on file    Non-medical: Not on file  Tobacco Use  . Smoking status: Never Smoker  . Smokeless tobacco: Never Used  Substance and Sexual Activity  . Alcohol use: Not Currently    Alcohol/week: 0.0 standard drinks    Frequency: Never  . Drug use: Never  . Sexual activity: Not on file  Lifestyle  . Physical activity    Days per week: Not on file    Minutes per session: Not on file  . Stress: Not on file  Relationships  . Social Herbalist on phone: Not on file    Gets together: Not on file    Attends religious service: Not on file    Active member of club or organization: Not on file    Attends meetings of clubs or organizations: Not on file    Relationship status: Not on file  . Intimate partner violence    Fear of current or ex partner: Not on file    Emotionally abused: Not on file    Physically abused: Not on file    Forced sexual activity: Not on file  Other Topics Concern  . Not on file  Social History Narrative   Patient is married [name]) and lives at home with wife and two children.   Patient has five adult children.   Patient is retired.   Patient has a college education.   Patient is right-handed.   Patient drinks two cups of coffee daily, 1/2 can of soda daily and tea- 2-3 times per week.      PHYSICAL EXAM  Vitals:   11/15/18 1355  BP: 106/67  Pulse: 84  Temp: (!) 96.3 F (35.7 C)  Weight: 187 lb (84.8 kg)  Height: 5\' 8"  (1.727 m)   Body mass index is 28.43 kg/m.   MMSE -  Mini Mental State Exam 11/15/2018 01/18/2018 07/11/2017  Not completed: - (No Data) -  Orientation to time 0 1 3  Orientation to Place 2 4 3   Registration 3 3 3   Attention/ Calculation 0 1 2  Recall 0 0 1  Language- name 2 objects 2 2 2   Language- repeat 1 1 1   Language- follow 3 step command 3 0 3  Language- read & follow direction 1 0 1  Write a sentence 0 1 1  Copy design 0 1 0  Copy design-comments named 3 animals. - -  Total score 12 14 20      Generalized: Well developed, in no acute distress   Neurological examination  Mentation: Alert oriented to time, place, history taking. Follows all commands speech and language fluent Cranial nerve II-XII:  Extraocular movements were full, visual field were full on confrontational test. . Head turning and shoulder shrug  were normal and symmetric. Motor: The motor testing reveals 5 over 5 strength of all 4 extremities. Good symmetric motor tone is noted throughout.  Sensory: Sensory testing is intact to soft touch on all 4 extremities. No evidence of extinction is noted.  Coordination: Cerebellar testing reveals good finger-nose-finger and heel-to-shin bilaterally.  Gait and station: Patient is in a wheelchair today   DIAGNOSTIC DATA (LABS, IMAGING, TESTING) - I reviewed patient records, labs, notes, testing and imaging myself where available.  Lab Results  Component Value Date   WBC 7.3 11/03/2018   HGB 12.9 (L) 11/03/2018   HCT 37.7 11/03/2018   MCV 94 11/03/2018   PLT 308 11/03/2018      Component Value Date/Time   NA 137 11/03/2018 0950   K 4.4 11/03/2018 0950   CL 98 11/03/2018 0950   CO2 26 11/03/2018 0950   GLUCOSE  109 (H) 11/03/2018 0950   GLUCOSE 135 (H) 09/02/2017 1143   BUN 18 11/03/2018 0950   CREATININE 1.00 11/03/2018 0950   CREATININE 1.18 (H) 12/25/2014 1605   CALCIUM 9.2 11/03/2018 0950   PROT 6.4 04/11/2017 1525   ALBUMIN 3.7 04/11/2017 1525   AST 19 04/11/2017 1525   ALT 14 04/11/2017 1525   ALKPHOS  78 04/11/2017 1525   BILITOT 0.6 04/11/2017 1525   GFRNONAA 68 11/03/2018 0950   GFRAA 79 11/03/2018 0950   Lab Results  Component Value Date   CHOL 127 10/24/2014   HDL 42 10/24/2014   LDLCALC 62 10/24/2014   TRIG 113 10/24/2014   CHOLHDL 3.0 10/24/2014   No results found for: HGBA1C No results found for: VITAMINB12 Lab Results  Component Value Date   TSH 1.210 04/11/2017      ASSESSMENT AND PLAN 85 y.o. year old male  has a past medical history of CAD (coronary artery disease), CHF (congestive heart failure) ([place]) (2016), Chronic pain, Complete heart block ([place]) (11/07/2014), Dementia ([place]), Depression, Facial cellulitis (02/19/2009), History of hiatal hernia (1980s), Hyperlipidemia, Hypertension, NICM (nonischemic cardiomyopathy) ([place]), Orthostatic hypotension, Presence of permanent cardiac pacemaker, Rheumatoid arthritis ([place]), Seizures ([place]) (2008), Sleep apnea (2008), Stroke [facility]) (2008), and TIA (transient ischemic attack) (2008). here with :  1.  Seizures 2.  Memory disturbance  The patient's memory score has remained relatively stable.  He will continue on Namenda.  The patient is not had any seizure events.  He will continue on Keppra extended release.  I have advised that if his symptoms worsen or he develops new symptoms he should let us know.  He will follow-up in 6 months or sooner if needed.    Ward Givens, MSN, NP-C 11/15/2018, 1:49 PM [facility] [address], [place], [place] [address] [phone]

## 2018-12-05 ENCOUNTER — Telehealth: Payer: Self-pay

## 2018-12-05 NOTE — Telephone Encounter (Signed)
Left message for patient to remind of missed remote transmission.  

## 2018-12-11 NOTE — Progress Notes (Signed)
No ICM remote transmission received for 12/04/2018 and next ICM transmission scheduled for 01/18/2019.

## 2018-12-21 ENCOUNTER — Other Ambulatory Visit: Payer: Self-pay | Admitting: Cardiovascular Disease

## 2018-12-29 ENCOUNTER — Other Ambulatory Visit: Payer: Self-pay

## 2018-12-29 ENCOUNTER — Ambulatory Visit (HOSPITAL_COMMUNITY): Payer: Medicare Other | Attending: Cardiovascular Disease

## 2018-12-29 DIAGNOSIS — I10 Essential (primary) hypertension: Secondary | ICD-10-CM | POA: Diagnosis not present

## 2019-01-03 ENCOUNTER — Other Ambulatory Visit: Payer: Self-pay | Admitting: Cardiovascular Disease

## 2019-01-03 NOTE — Telephone Encounter (Signed)
87 M 84.4 kg SCr 1.0 (9/20), CrCl 64; LOV Claiborne Billings 06/2018

## 2019-01-12 ENCOUNTER — Other Ambulatory Visit: Payer: Self-pay

## 2019-01-12 ENCOUNTER — Encounter: Payer: Self-pay | Admitting: Cardiovascular Disease

## 2019-01-12 ENCOUNTER — Ambulatory Visit: Payer: Medicare Other | Admitting: Cardiovascular Disease

## 2019-01-12 VITALS — BP 112/56 | HR 93 | Ht 68.0 in | Wt 183.0 lb

## 2019-01-12 DIAGNOSIS — I480 Paroxysmal atrial fibrillation: Secondary | ICD-10-CM | POA: Diagnosis not present

## 2019-01-12 DIAGNOSIS — Z87898 Personal history of other specified conditions: Secondary | ICD-10-CM

## 2019-01-12 DIAGNOSIS — Z7901 Long term (current) use of anticoagulants: Secondary | ICD-10-CM

## 2019-01-12 DIAGNOSIS — G3184 Mild cognitive impairment, so stated: Secondary | ICD-10-CM

## 2019-01-12 DIAGNOSIS — I5042 Chronic combined systolic (congestive) and diastolic (congestive) heart failure: Secondary | ICD-10-CM | POA: Diagnosis not present

## 2019-01-12 DIAGNOSIS — Z950 Presence of cardiac pacemaker: Secondary | ICD-10-CM | POA: Diagnosis not present

## 2019-01-12 DIAGNOSIS — E785 Hyperlipidemia, unspecified: Secondary | ICD-10-CM

## 2019-01-12 DIAGNOSIS — I10 Essential (primary) hypertension: Secondary | ICD-10-CM

## 2019-01-12 NOTE — Progress Notes (Signed)
Patient ID: Thomas Long, male   DOB: 1931-06-30, 85 y.o.   MRN: [id number]    HPI: Thomas Long is a 85 y.o. male who presents to the office today for a 12 month followup cardiology evaluation.  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  In 2011 cardiac catheterization revealed ejection fraction at 50% with mild coronary artery disease coronary calcification with segmental 20% narrowing in the proximal LAD and mid LAD with mild muscle bridging, calcification at the ostium of the right coronary artery with 20-30% narrowing in the proximal to mid RCA and 30% distal narrowing. He has a history of a TIA with possible seizure and has been on Aggrenox and Keppra and is currently followed by neurology. An echo Doppler study in April 2012 showed an ejection fraction of 50% with inferior hypokinesis, mild mitral annular calcification and mild TR, mild aortic sclerosis without stenosis. I had seen him on 05/30/2012 with complaints of increased fatigability as well as shortness of breath with less activity.  A two-year followup echo Doppler study on 07/06/2012 which showed mild LVH. Ejection fraction was 50-55% and again there was mild hypokinesis of the basal mid inferior myocardium and  grade 1 diastolic dysfunction. He had minimal pulmonary hypertension with PA pressure 32 mm, mitral annular calcification with trivial MR. His aortic valve was mildly sclerotic without stenosis.  When I saw on 08/01/2012 he had orthostatic symptoms and was hypotensive. At that time, I weaned slowly and ultimately discontinued his Bystolic. He felt improved off this therapy. He has been without exertional chest pain.  He does admit to some aching nonexertional chest pain, particularly when he lies down, which lasts seconds to minutes. He does note some shortness of breath particularly with activity.  He denies any recent seizure activity.  He has had some difficulty with early memory loss and has been  started on Namenda 10 mg twice a day by neurology.  He continues to take atorvastatin 10 mg for hyperlipidemia.  He is unaware of palpitations.   He underwent a follow-up echo Doppler study on 08/29/2014.  This showed normal LV cavity size, but his LV function has slightly declined and was now 40-45% without segmental wall motion abnormality.  There also was felt to be mild the reduced RV systolic function.  He has had issues with low blood pressure.  He  saw Karrie Doffing, PA in Dr. Stefani Dama office.  He has not had any seizure activity but continues to be on Keppra.  He does have some short-term memory issues, which seem to be exacerbated by being overtired.  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  He no longer does visitation at the hospital due to his inability to walk distances without developing  shortness of breath.  He denies any chest pain.  He denies any awareness of arrhythmia.  He denies any bleeding.    He was admitted to [facility] hospital on 11/07/2014 with symptomatic complete heart block.  Upon presenting to the emergency room, his heart rate was in the 30s.  He had experienced several episodes of recurrent chest pain.  He was brought semi-urgently to the cardiac catheterization laboratory in cardiac catheterization was performed by me without difficulty.  This revealed no significant obstructive CAD but there was mild mid systolic bridging in the mid LAD with narrowing up to 50% during systole.  He had a normal left circumflex coronary artery and  there was calcification of the ostium of the RCA without significant stenosis and a very large dominant RCA vessel.  He had mild global LV dysfunction with an ejection fraction at 45%.  There was extensive mitral annular calcification.  He underwent insertion of a temporary transvenous pacemaker for his underlying complete heart block.  The following day he underwent permanent pacemaker insertion by Dr.  Rayann Heman and had a [employer] biventricular pacemaker implantation without difficulty.  Unfortunately, on October 2 1 back to the EP lab due to right atrial lead dislodgment and a new right atrial lead was placed and it was repositioning of a previously implanted right ventricular lead.  He developed a right groin pseudoaneurysm and underwent repair by Dr. Sherren Mocha Early on 11/15/2014.  He saw Dr. Rayann Heman back in the office on 01/07/2015.  Interrogation of his device revealed paroxysmal atrial fibrillation.  As result, his Aggrenox was discontinued and he was started on Xarelto 20 mg daily.  A follow-up echo Doppler study now showed normalization of LV function with an ejection fraction of 55-60%.  There was grade 2 diastolic dysfunction.  There was mild mitral regurgitation and mild dilatation of his left atrium.  When seen in March 2018 he was doing well and was also being followed by Dr. Rayann Heman without recurrent atrial fibrillation.  He was started on corlander 5 mg.  he had occasional low blood pressure.  He had normal pacemaker function.  He has continued to be on Xarelto for his atrial fibrillation and prior stroke.  He was evaluated in the emergency room on March 24, 2017 . He was recently seen by Almyra Deforest, PAC.  He had more pronounced weakness.  He denied chest pain.  He underwent a follow-up echo Doppler study on April 15, 2017 which revealed an EF of 50-55%.  There was grade 1 diastolic dysfunction.  Aortic root size was minimally increased.  The ascending aorta measured 40 mm.  There was mitral annular calcification with mild MR.  Abnormal septal function due to pacemaker induced dyssynergy.   I  saw him in March 2019.  He subsequently developed recurrent atrial fibrillation and was hospitalized for Tikosyn load in July 2019. Subsequently, he has not been demonstrated to have any recurrent episodes of atrial arrhythmia.  He continues to be followed by Dr. Rayann Heman and evaluation in showed normal pacemaker  function. His AF burden is less than 1% on Tikosyn.  He continued to be on Xarelto with a chads2vasc score of 5.  His LV function has improved with CRT.  He has had issues with low blood pressure, walks with a walker and had fallen 2 times t secondary to tripping.  He is followed by neurology denied any recurrent seizures. He does have intermittent issues with short-term memory.   I last evaluate him in a telemedicine visit in May 2020.  At that time he was stable without chest pain.  He was unaware of any recurrent episodes of PAF.  I recommended a follow-up echo Doppler study be done prior to his next office visit.  Since I saw him, he was evaluated by EP and saw Barrington Ellison, PA-C.  He had normal pacemaker function.  AF burden was less than 1% on Tikosyn.  He continued to be on Xarelto with a CHA2DS2-VASc score of 5.  He underwent his follow-up echo Doppler study on December 29, 2018 which revealed normal LV function with EF 55 to 60%, mild LVH, and mitral annular calcification.  He presents to  the office today with his daughter.  He denies recurrent chest pain.  He continues to walk with a walker.  He has been unaware of recurrent atrial fibrillation.  He denies any lightheadedness or dizziness.  He does have some confusion at night and oftentimes may take a nap in the afternoon.  He presents for evaluation.  Past Medical History:  Diagnosis Date  . CAD (coronary artery disease)    a. LHC 10/2014: nonobstructive disease, 30% LAD with bridging up to 50%.  . CHF (congestive heart failure) ([place]) 2016   "this was the reason for his pacemaker" (08/23/2017)  . Chronic pain   . Complete heart block ([place]) 11/07/2014   a. s/p [facility] Quadra Allure MP RF model [id number] (serial number [id number]) biventricular pacemaker 10/2014.  Marland Kitchen Dementia ([place])    "memory lapses q now and then; dx'd as delayed memory" (08/23/2017)  . Depression   . Facial cellulitis 02/19/2009   "related to [facility] & having skin  cancer zapped; took him off the [place]"  . History of hiatal hernia 1980s  . Hyperlipidemia   . Hypertension   . NICM (nonischemic cardiomyopathy) ([place])    a. 10/2014: EF 45%. (Prev 40-45% by echo 08/2014).  . Orthostatic hypotension   . Presence of permanent cardiac pacemaker   . Rheumatoid arthritis ([place])    "hands mainly; knees"  (08/23/2017)  . Seizures ([place]) 2008   "vs stroke; never determined which it was; on sz RX since"  (08/23/2017)  . Sleep apnea 2008   "gone since losing weight" (08/23/2017)  . Stroke [facility]) 2008   "vs seizure; never determined which it was; on sz RX since"   (08/23/2017)  . TIA (transient ischemic attack) 2008   "vs seizure; never determined which it was; on sz RX since"   (08/23/2017)    Past Surgical History:  Procedure Laterality Date  . APPENDECTOMY  ~ 1988  . CARDIAC CATHETERIZATION  04/01/09   which showed low normal EF at 50% with question of underlying borderline area of minimal inferoapical hypercontractility. He had mild coronary obstructive disease with coronary calcification and segmental 20% narrowing in the LAD proximally, 20% in the mid LAD with muscle bridging. There was calcification at the ostium of the RCA, and 20 to 30%narrowing of the proximal to mid RCA with 30% distal n  . CARDIAC CATHETERIZATION N/A 11/07/2014   Procedure: Left Heart Cath and Coronary Angiography/ temp wire;  Surgeon: [name], MD;  Location: [place] [facility];  Service: Cardiovascular;  Laterality: N/A;  . CARDIOVERSION N/A 07/19/2017   Procedure: CARDIOVERSION;  Surgeon: Acie Fredrickson Wonda Cheng, MD;  Location: [place];  Service: Cardiovascular;  Laterality: N/A;  . CATARACT EXTRACTION W/ INTRAOCULAR LENS  IMPLANT, BILATERAL Bilateral early 2000's  . CHOLECYSTECTOMY OPEN  1980's  . EP IMPLANTABLE DEVICE N/A 11/08/2014   a. STJ CRTP implanted by Dr Rayann Heman  . EP IMPLANTABLE DEVICE N/A 11/10/2014   RA lead revision Dr Rayann Heman  . EXPLORATORY LAPAROTOMY  1990's   "put  intestines back in & added mesh"  . FALSE ANEURYSM REPAIR Right 11/15/2014   Procedure: REPAIR OF RIGHT FEMORAL FALSE ANEURYSM ;  Surgeon: Rosetta Posner, MD;  Location: [place];  Service: Vascular;  Laterality: Right;  . HERNIA REPAIR  1990s  . KNEE CARTILAGE SURGERY Left 1990's   "opened it up"  . TONSILLECTOMY      No Known Allergies  Current Outpatient Medications  Medication Sig Dispense Refill  .  acetaminophen (TYLENOL) 500 MG tablet Take 500 mg by mouth every 6 (six) hours as needed for moderate pain or headache.    Marland Kitchen atorvastatin (LIPITOR) 10 MG tablet TAKE 1 TABLET DAILY 90 tablet 2  . cetirizine (ZYRTEC) 10 MG tablet Take 10 mg by mouth daily.    Marland Kitchen dofetilide (TIKOSYN) 250 MCG capsule TAKE ONE CAPSULE BY MOUTH TWICE A DAY 180 capsule 3  . latanoprost (XALATAN) 0.005 % ophthalmic solution Place 1 drop into both eyes at bedtime.   3  . levETIRAcetam (KEPPRA XR) 500 MG 24 hr tablet Take 1 tablet (500 mg total) by mouth daily. 90 tablet 3  . memantine (NAMENDA) 10 MG tablet Take 1 tablet (10 mg total) by mouth 2 (two) times daily. 180 tablet 3  . metoprolol succinate (TOPROL-XL) 25 MG 24 hr tablet Take 1 tablet (25 mg total) by mouth daily. Please make yearly appt with Dr. Rayann Heman for December for future refills. 1st attempt 90 tablet 0  . Multiple Vitamins-Minerals (CENTRUM SILVER ADULT 50+ PO) Take 1 capsule by mouth daily.    Alveda Reasons 20 MG TABS tablet TAKE 1 TABLET DAILY WITH   SUPPER 90 tablet 1   No current facility-administered medications for this visit.     Socially he is married has 5 children 6 grandchildren. He typically goes to bed approximately 8:30 at night and wakes up at approximately 4 AM in the morning. In the early morning he spends time in prayer and then typically attends 7 AM Mass. Since I last saw him he is retired from his [employer].  ROS General: Negative; No fevers, chills, or night sweats;  HEENT: Positive for visual changes in his right eye due to  glaucoma; no change in hearing, sinus congestion, difficulty swallowing Pulmonary: Negative; No cough, wheezing, shortness of breath, hemoptysis Cardiovascular: See history of present illness GI: Negative; No nausea, vomiting, diarrhea, or abdominal pain GU: Negative; No dysuria, hematuria, or difficulty voiding Musculoskeletal: Negative; no myalgias, joint pain, or weakness Hematologic/Oncology: Negative; no easy bruising, bleeding Endocrine: Negative; no heat/cold intolerance; no diabetes Neuro: No recent seizures.;  Some confusion at night, some short-term memory loss Skin: Negative; No rashes or skin lesions Psychiatric: Negative; No behavioral problems, depression Sleep: Negative; No snoring, daytime sleepiness, hypersomnolence, bruxism, restless legs, hypnogognic hallucinations, no cataplexy Other comprehensive 14 point system review is negative.   PE BP (!) 112/56   Pulse 93   Ht _0  (1.727 m)   Wt 183 lb (83 kg)   BMI 27.83 kg/m      Repeat blood pressure by me was 110/60  Wt Readings from Last 3 Encounters:  01/12/19 183 lb (83 kg)  11/15/18 187 lb (84.8 kg)  11/03/18 184 lb (83.5 kg)   General: Alert, oriented, no distress.  Appears frail. Skin: normal turgor, no rashes, warm and dry HEENT: Normocephalic, atraumatic. Pupils equal round and reactive to light; sclera anicteric; extraocular muscles intact;  Nose without nasal septal hypertrophy Mouth/Parynx benign; Mallinpatti scale 2 Neck: No JVD, no carotid bruits; normal carotid upstroke Lungs: clear to ausculatation and percussion; no wheezing or rales Chest wall: without tenderness to palpitation Heart: PMI not displaced, RRR, s1 s2 normal, 1/6 systolic murmur, no diastolic murmur, no rubs, gallops, thrills, or heaves Abdomen: soft, nontender; no hepatosplenomehaly, BS+; abdominal aorta nontender and not dilated by palpation. Back: Kyphotic;no CVA tenderness Pulses 2+ Musculoskeletal: full range of motion,  normal strength, no joint deformities Extremities: no clubbing cyanosis or edema, Homan's sign  negative  Neurologic: grossly nonfocal; Cranial nerves grossly wnl Psychologic: Normal mood and affect   ECG (independently read by me): Atrial sensed ventricular paced rhythm.  PVC.    March 2018 ECG (independently read by me): Atrially sensed, ventricular paced rhythm at 94 bpm.  PR interval 198 ms.  September 2017 ECG (independently read by me): Atrial sensing and ventricular paced rhythm at 101 bpm.  January 2017 ECG (independently read by me): Paced rhythm at 93 bpm.  Possible underlying A. fib.  ECG (independently read by me): Sinus tachycardia at 10 7 bpm.  First degree AV block with a PR interval at 224 ms.  Mild RV conduction delay.  No ectopy  May 2016 ECG (independently read by me): Sinus rhythm at 98 bpm.  Occasional PVC.  No ST segment changes.  QTc interval 472 ms.  First-degree AV block with a PR interval at 230 ms.  March 2015 ECG (independently read by me): Sinus tachycardia 103 beats per minute with one PAC. First degree AV block. RV conduction delay.  Prior 11/02/2012 ECG: Normal sinus rhythm at 94beats per minute with a rare PAC. Borderline first-degree block; PR 210 ms. nonspecific T changes.  LABS: BMP Latest Ref Rng & Units 11/03/2018 01/09/2018 09/02/2017  Glucose 65 - 99 mg/dL 109(H) 108(H) 135(H)  BUN 8 - 27 mg/dL _0 Creatinine 0.76 - 1.27 mg/dL 1.00 1.08 0.95  BUN/Creat Ratio 10 - _1 -  Sodium 134 - 144 mmol/L 137 138 139  Potassium 3.5 - 5.2 mmol/L 4.4 4.7 4.2  Chloride 96 - 106 mmol/L 98 98 101  CO2 20 - 29 mmol/L _2 Calcium 8.6 - 10.2 mg/dL 9.2 9.2 9.0   Hepatic Function Latest Ref Rng & Units 04/11/2017 10/24/2014 04/25/2013  Total Protein 6.0 - 8.5 g/dL 6.4 6.1 6.8  Albumin 3.5 - 4.7 g/dL 3.7 3.7 4.1  AST 0 - 40 IU/L _3 ALT 0 - 44 IU/L _4 Alk Phosphatase 39 - 117 IU/L 78 49 51  Total Bilirubin 0.0 - 1.2 mg/dL 0.6 0.8 0.9    CBC Latest Ref Rng & Units 11/03/2018 07/07/2017 04/11/2017  WBC 3.4 - 10.8 x10E3/uL 7.3 8.2 7.6  Hemoglobin 13.0 - 17.7 g/dL 12.9(L) 12.1(L) 13.1  Hematocrit 37.5 - 51.0 % 37.7 35.2(L) 38.5  Platelets 150 - 450 x10E3/uL 308 323 310   Lab Results  Component Value Date   MCV 94 11/03/2018   MCV 92 07/07/2017   MCV 93 04/11/2017   Lab Results  Component Value Date   TSH 1.210 04/11/2017  No results found for: HGBA1C  Lipid Panel     Component Value Date/Time   CHOL 127 10/24/2014 0814   TRIG 113 10/24/2014 0814   HDL 42 10/24/2014 0814   CHOLHDL 3.0 10/24/2014 0814   VLDL 23 10/24/2014 0814   LDLCALC 62 10/24/2014 0814   RADIOLOGY: No results found.  IMPRESSION:  1. Paroxysmal atrial fibrillation (HCC)   2. Essential hypertension   3. History of Chronic combined systolic and diastolic heart failure ([place]); improved with biventricular pacing   4. Pacemaker   5. Amnestic MCI (mild cognitive impairment with memory loss)   6. Anticoagulation adequate   7. Hyperlipidemia with target LDL less than 70   8. History of seizure     ASSESSMENT AND PLAN: Thomas Long is an 85 year old white male has a history of documented mild CAD and previously was on beta blocker therapy.  Beta blocker had been discontinued due to orthostatic hypotension and marked fatigability.  In 2016, he developed symptomatic complete heart block with heart rates in the 30s in the setting also of the mild recurrent chest pain.  Cardiac catheterization did not demonstrate significant obstructive disease and his LV function was reduced initially at 45%.  He underwent insertion of a biventricular pacemaker and several days later required lead revision due to atrial lead dislodgment.  His hospital course was complicated by the development of a pseudoaneurysm and he underwent successful repair by Dr. Donnetta Hutching.  He subsequently developed paroxysmal atrial fibrillation and this has been well controlled since initiation of  Tikosyn and metoprolol succinate 25 mg daily.  He is on Xarelto and is tolerating this well without bleeding.  Most recent pacemaker interrogation showed an A. fib burden of less than 1% and he had normal biventricular pacemaker burden.  At his last device check, he he was biventricular pacing greater than 99% of the time.  Estimated longevity was 3 years and 10 months.  He is not having any chest pain.  I reviewed his most recent echo Doppler study which continues to show preserved LV function with EF at 55 to 60%, mild LVH and abnormal septal motion most likely from his paced rhythm.  Diastolic parameters were indeterminate.  Initially on low-dose atorvastatin at 10 mg.  He has not had any seizure activity and continues to be on Keppra.  He has had short-term memory issues and is on Namenda 10 mg.   [name], MD, [facility]  01/14/2019 11:10 AM

## 2019-01-12 NOTE — Patient Instructions (Addendum)
Medication Instructions:    no changes  If you need a refill on your cardiac medications before your next appointment, please call your pharmacy*  Lab Work: Not needed If you have labs (blood work) drawn today and your tests are completely normal, you will receive your results only by: Marland Kitchen MyChart Message (if you have MyChart) OR . A paper copy in the mail If you have any lab test that is abnormal or we need to change your treatment, we will call you to review the results.  Testing/Procedures: Not needed  Follow-Up: At [facility], you and your health needs are our priority.  As part of our continuing mission to provide you with exceptional heart care, we have created designated Provider Care Teams.  These Care Teams include your primary Cardiologist (physician) and Advanced Practice Providers (APPs -  Physician Assistants and Nurse Practitioners) who all work together to provide you with the care you need, when you need it.  Your next appointment:   12 month(s)  The format for your next appointment:   In Person  Provider:   Glenetta Hew, MD  Other Instructions

## 2019-01-14 ENCOUNTER — Encounter: Payer: Self-pay | Admitting: Cardiovascular Disease

## 2019-01-17 ENCOUNTER — Ambulatory Visit (INDEPENDENT_AMBULATORY_CARE_PROVIDER_SITE_OTHER): Payer: Medicare Other | Admitting: *Deleted

## 2019-01-17 DIAGNOSIS — I442 Atrioventricular block, complete: Secondary | ICD-10-CM

## 2019-01-18 LAB — CUP PACEART REMOTE DEVICE CHECK
Battery Remaining Longevity: 41 mo
Battery Remaining Percentage: 63 %
Battery Voltage: 2.9 V
Brady Statistic AP VP Percent: 1 %
Brady Statistic AP VS Percent: 1 %
Brady Statistic AS VP Percent: 99 %
Brady Statistic AS VS Percent: 1 %
Brady Statistic RA Percent Paced: 1 %
Date Time Interrogation Session: 20201210123702
Implantable Lead Implant Date: 20160930
Implantable Lead Implant Date: 20160930
Implantable Lead Implant Date: 20160930
Implantable Lead Location: 753858
Implantable Lead Location: 753859
Implantable Lead Location: 753860
Implantable Pulse Generator Implant Date: 20160930
Lead Channel Impedance Value: 360 Ohm
Lead Channel Impedance Value: 600 Ohm
Lead Channel Impedance Value: 680 Ohm
Lead Channel Pacing Threshold Amplitude: 0.75 V
Lead Channel Pacing Threshold Amplitude: 0.75 V
Lead Channel Pacing Threshold Amplitude: 1.5 V
Lead Channel Pacing Threshold Pulse Width: 0.5 ms
Lead Channel Pacing Threshold Pulse Width: 0.5 ms
Lead Channel Pacing Threshold Pulse Width: 0.8 ms
Lead Channel Sensing Intrinsic Amplitude: 12 mV
Lead Channel Sensing Intrinsic Amplitude: 3.7 mV
Lead Channel Setting Pacing Amplitude: 2 V
Lead Channel Setting Pacing Amplitude: 2 V
Lead Channel Setting Pacing Amplitude: 2.75 V
Lead Channel Setting Pacing Pulse Width: 0.5 ms
Lead Channel Setting Pacing Pulse Width: 0.8 ms
Lead Channel Setting Sensing Sensitivity: 5 mV
Pulse Gen Model: 3262
Pulse Gen Serial Number: 7798436

## 2019-01-19 ENCOUNTER — Ambulatory Visit (INDEPENDENT_AMBULATORY_CARE_PROVIDER_SITE_OTHER): Payer: Medicare Other

## 2019-01-19 DIAGNOSIS — Z950 Presence of cardiac pacemaker: Secondary | ICD-10-CM

## 2019-01-19 DIAGNOSIS — I5022 Chronic systolic (congestive) heart failure: Secondary | ICD-10-CM

## 2019-01-19 NOTE — Progress Notes (Signed)
EPIC Encounter for ICM Monitoring  Patient Name: Thomas Long is a 85 y.o. male Date: 01/19/2019 Primary Care Physican: Lajean Manes, MD Primary [place] Electrophysiologist: Allred Bi-V Pacing:99%  AT/AF Burden: 6.8%   Spoke with wife and patient is doing reasonably well.    CorVue thoracic impedancenormal (change in reference and daily impedance line starting 03/2018).  No diuretic.  LABS: 04/07/2018 Creatinine 0.92, BUN 20, Potassium 4.2, Sodium 136, GFR 78-95 (received fax from Dr Carlyle Lipa office 3/3)  Recommendations: Reinforced limiting salt intake to < 2000 mg daily and fluid intake to 64 oz daily.  Encouraged to call if experiencing fluid symptoms.  Follow-up plan: ICM clinic phone appointment on 02/19/2019.   91 day device clinic remote transmission 04/18/2019.    Copy of ICM check sent to Dr. Rayann Heman.   3 month ICM trend: 01/18/2019    1 Year ICM trend:      Rosalene Billings, RN 01/19/2019 9:18 AM

## 2019-01-25 ENCOUNTER — Other Ambulatory Visit: Payer: Self-pay | Admitting: Physician Assistant

## 2019-01-26 ENCOUNTER — Other Ambulatory Visit: Payer: Self-pay | Admitting: Adult Health

## 2019-02-15 ENCOUNTER — Other Ambulatory Visit: Payer: Self-pay | Admitting: Internal Medicine

## 2019-02-20 ENCOUNTER — Telehealth: Payer: Self-pay

## 2019-02-20 NOTE — Telephone Encounter (Signed)
Left message for patient to remind of missed remote transmission.  

## 2019-03-05 NOTE — Progress Notes (Signed)
No ICM remote transmission received for 02/19/2019 and next ICM transmission scheduled for 03/19/2019.

## 2019-03-07 ENCOUNTER — Telehealth: Payer: Self-pay | Admitting: Adult Health

## 2019-03-07 NOTE — Telephone Encounter (Signed)
Pt's daughter [name] called stating that the pts memantine (NAMENDA) 10 MG tablet needs a PA and sent to [facility]. Daughter states that the insurance has been updated. Please advise.

## 2019-03-09 ENCOUNTER — Ambulatory Visit: Payer: Medicare Other

## 2019-03-12 NOTE — Telephone Encounter (Signed)
Received from [facility] that product is on plans list of covered drugs. PA is not required. [phone].

## 2019-03-12 NOTE — Telephone Encounter (Signed)
I called pts daughter, [name].  I relayed that did PA on memantine and this was on his list of covered drugs.  Np PA needed.  If she needs anything else to let me know.

## 2019-03-21 ENCOUNTER — Other Ambulatory Visit: Payer: Self-pay | Admitting: Adult Health

## 2019-03-26 NOTE — Progress Notes (Signed)
No ICM remote transmission received for 03/19/2019 and next ICM transmission scheduled for 04/03/2019.

## 2019-04-03 ENCOUNTER — Telehealth: Payer: Self-pay

## 2019-04-03 NOTE — Telephone Encounter (Signed)
Unable to speak  with patient to remind of missed remote transmission 

## 2019-04-11 NOTE — Progress Notes (Signed)
No ICM remote transmission received for 04/03/2019 and next ICM transmission scheduled for 04/30/2019.

## 2019-04-18 ENCOUNTER — Ambulatory Visit (INDEPENDENT_AMBULATORY_CARE_PROVIDER_SITE_OTHER): Payer: Medicare Other | Admitting: *Deleted

## 2019-04-18 DIAGNOSIS — I442 Atrioventricular block, complete: Secondary | ICD-10-CM

## 2019-04-18 MED ORDER — RIVAROXABAN 20 MG PO TABS: ORAL_TABLET | ORAL | 1 refills | Status: DC

## 2019-04-19 LAB — CUP PACEART REMOTE DEVICE CHECK
Battery Remaining Longevity: 37 mo
Battery Remaining Percentage: 56 %
Battery Voltage: 2.89 V
Brady Statistic AP VP Percent: 1 %
Brady Statistic AP VS Percent: 1 %
Brady Statistic AS VP Percent: 99 %
Brady Statistic AS VS Percent: 1 %
Brady Statistic RA Percent Paced: 1 %
Date Time Interrogation Session: 20210310102037
Implantable Lead Implant Date: 20160930
Implantable Lead Implant Date: 20160930
Implantable Lead Implant Date: 20160930
Implantable Lead Location: 753858
Implantable Lead Location: 753859
Implantable Lead Location: 753860
Implantable Pulse Generator Implant Date: 20160930
Lead Channel Impedance Value: 360 Ohm
Lead Channel Impedance Value: 630 Ohm
Lead Channel Impedance Value: 690 Ohm
Lead Channel Pacing Threshold Amplitude: 0.75 V
Lead Channel Pacing Threshold Amplitude: 0.875 V
Lead Channel Pacing Threshold Amplitude: 1.5 V
Lead Channel Pacing Threshold Pulse Width: 0.5 ms
Lead Channel Pacing Threshold Pulse Width: 0.5 ms
Lead Channel Pacing Threshold Pulse Width: 0.8 ms
Lead Channel Sensing Intrinsic Amplitude: 2.6 mV
Lead Channel Sensing Intrinsic Amplitude: 6.7 mV
Lead Channel Setting Pacing Amplitude: 2 V
Lead Channel Setting Pacing Amplitude: 2 V
Lead Channel Setting Pacing Amplitude: 2.75 V
Lead Channel Setting Pacing Pulse Width: 0.5 ms
Lead Channel Setting Pacing Pulse Width: 0.8 ms
Lead Channel Setting Sensing Sensitivity: 5 mV
Pulse Gen Model: 3262
Pulse Gen Serial Number: 7798436

## 2019-04-19 NOTE — Progress Notes (Signed)
PPM Remote  

## 2019-04-24 ENCOUNTER — Telehealth: Payer: Self-pay

## 2019-04-24 NOTE — Telephone Encounter (Signed)
Opened in error

## 2019-04-30 ENCOUNTER — Ambulatory Visit (INDEPENDENT_AMBULATORY_CARE_PROVIDER_SITE_OTHER): Payer: Medicare Other

## 2019-04-30 DIAGNOSIS — Z950 Presence of cardiac pacemaker: Secondary | ICD-10-CM | POA: Diagnosis not present

## 2019-04-30 DIAGNOSIS — I5022 Chronic systolic (congestive) heart failure: Secondary | ICD-10-CM

## 2019-05-02 NOTE — Progress Notes (Signed)
EPIC Encounter for ICM Monitoring  Patient Name: KEYDON MARCO is a 86 y.o. male Date: 05/02/2019 Primary Care Physican: Lajean Manes, MD Primary [place] Electrophysiologist: Allred Bi-V Pacing:99%  AT/AF Burden: <1%   Attempted call to patient and unable to reach.  Left detailed message per DPR regarding transmission. Transmission reviewed.   CorVue thoracic impedancesuggesting possible fluid accumulation starting 04/23/2019.  No diuretic.  LABS: 04/07/2018 Creatinine 0.92, BUN 20, Potassium 4.2, Sodium 136, GFR 78-95 (received fax from Dr Carlyle Lipa office 3/3)  Recommendations: Left voice mail with ICM number. Ecouraged to limit salt intake and to call if experiencing any fluid symptoms.  Follow-up plan: ICM clinic phone appointment on 05/07/2019 (manual send) to recheck fluid levels.   91 day device clinic remote transmission 07/18/2019.    Copy of ICM check sent to Dr. Rayann Heman and Dr Claiborne Billings.   3 month ICM trend: 05/01/2019    1 Year ICM trend:       Rosalene Billings, RN 05/02/2019 11:05 AM

## 2019-05-02 NOTE — Progress Notes (Signed)
Wife returned call.  She reports patient sleeps a lot but does not have any fluid symptoms.  He does not have a great appetite.  Transmission reviewed.  Advised to limit salt intake.  She will send a remote transmission on Monday, 3/29 to recheck fluid levels.  No changes and encouraged to call if experiencing any fluid symptoms.

## 2019-05-07 ENCOUNTER — Ambulatory Visit (INDEPENDENT_AMBULATORY_CARE_PROVIDER_SITE_OTHER): Payer: Medicare Other

## 2019-05-07 DIAGNOSIS — I5022 Chronic systolic (congestive) heart failure: Secondary | ICD-10-CM

## 2019-05-07 DIAGNOSIS — Z950 Presence of cardiac pacemaker: Secondary | ICD-10-CM

## 2019-05-11 NOTE — Progress Notes (Signed)
EPIC Encounter for ICM Monitoring  Patient Name: Thomas Long is a 86 y.o. male Date: 05/11/2019 Primary Care Physican: Lajean Manes, MD Primary [place] Electrophysiologist: Allred Bi-V Pacing:99%  AT/AF Burden: 6.3%   Transmission reviewed.   CorVue thoracic impedancereturned to normal.  No diuretic.  LABS: 04/07/2018 Creatinine 0.92, BUN 20, Potassium 4.2, Sodium 136, GFR 78-95 (received fax from Dr Carlyle Lipa office 3/3)  Recommendations: None  Follow-up plan: ICM clinic phone appointment on4/27/2021. 91 day device clinic remote transmission 07/18/2019.   Copy of ICM check sent to Dr.Allred  3 month ICM trend: 05/07/2019    1 Year ICM trend:       Rosalene Billings, RN 05/11/2019 1:29 PM

## 2019-05-18 ENCOUNTER — Other Ambulatory Visit: Payer: Self-pay | Admitting: Geriatric Medicine

## 2019-05-18 ENCOUNTER — Other Ambulatory Visit: Payer: Self-pay

## 2019-05-18 ENCOUNTER — Ambulatory Visit
Admission: RE | Admit: 2019-05-18 | Discharge: 2019-05-18 | Disposition: A | Discharge status: Home / Self Care | Payer: Medicare Other | Source: Ambulatory Visit | Attending: Geriatric Medicine | Admitting: Geriatric Medicine

## 2019-05-18 DIAGNOSIS — M25552 Pain in left hip: Secondary | ICD-10-CM

## 2019-05-18 DIAGNOSIS — W19XXXA Unspecified fall, initial encounter: Secondary | ICD-10-CM

## 2019-05-21 ENCOUNTER — Ambulatory Visit: Payer: Medicare Other | Admitting: Adult Health

## 2019-05-21 ENCOUNTER — Other Ambulatory Visit: Payer: Self-pay

## 2019-05-21 ENCOUNTER — Encounter: Payer: Self-pay | Admitting: Adult Health

## 2019-05-21 VITALS — BP 118/68 | HR 64 | Temp 97.7°F | Ht 63.0 in | Wt 183.0 lb

## 2019-05-21 DIAGNOSIS — F0390 Unspecified dementia without behavioral disturbance: Secondary | ICD-10-CM

## 2019-05-21 DIAGNOSIS — R569 Unspecified convulsions: Secondary | ICD-10-CM

## 2019-05-21 NOTE — Patient Instructions (Signed)
Continue Namenda for memory Continue Keppra XR 500 mg daily for seizures If your symptoms worsen or you develop new symptoms please let us know.

## 2019-05-21 NOTE — Progress Notes (Signed)
PATIENT: BRENDA MILUM DOB: [birth year]  REASON FOR VISIT: follow up HISTORY FROM: patient  HISTORY OF PRESENT ILLNESS: Today 05/21/19:  Mr. Craft is an 86 year old male with a history of memory disturbance and seizures.  He returns today for follow-up.  He denies any seizure events.  He remains on Keppra.  Patient feels that his memory has remained stable.  He requires minimal supervision with ADLs.  He no longer operates a Teacher, music.  He is with his daughter today.  They are discussing getting more resources in the home to help with the patient.  They manage all of his medication and appointments.  His son comes to help him shower once a week.  Every other day his wife helps him with bathing.  He returns today for an evaluation.  HISTORY 11/15/18:  Mr. Diprima is an 86 year old male with a history of memory disturbance and seizures.  He returns today for follow-up.  He is here today with his wife and daughter.  Wife reports that he has not had any seizure events.  He continues to do well on Keppra.  Wife states that his memory fluctuates.  She states that he has good days and bad days.  She states that he sometimes has issues with word finding.  He requires minimal supervision with ADLs.  He does not operate a motor vehicle.  Overall she feels that he has remained relatively stable.  He returns today for an evaluation.  REVIEW OF SYSTEMS: Out of a complete 14 system review of symptoms, the patient complains only of the following symptoms, and all other reviewed systems are negative.  See HPI  ALLERGIES: No Known Allergies  HOME MEDICATIONS: Outpatient Medications Prior to Visit  Medication Sig Dispense Refill  . acetaminophen (TYLENOL) 500 MG tablet Take 500 mg by mouth every 6 (six) hours as needed for moderate pain or headache.    Marland Kitchen atorvastatin (LIPITOR) 10 MG tablet TAKE 1 TABLET DAILY 90 tablet 2  . cetirizine (ZYRTEC) 10 MG tablet Take 10 mg by mouth daily.    Marland Kitchen  dofetilide (TIKOSYN) 250 MCG capsule TAKE 1 CAPSULE BY MOUTH TWICE A DAY 180 capsule 2  . latanoprost (XALATAN) 0.005 % ophthalmic solution Place 1 drop into both eyes at bedtime.   3  . levETIRAcetam (KEPPRA XR) 500 MG 24 hr tablet TAKE 1 TABLET BY MOUTH EVERY DAY 90 tablet 3  . memantine (NAMENDA) 10 MG tablet TAKE 1 TABLET BY MOUTH TWICE A DAY 180 tablet 3  . metoprolol succinate (TOPROL-XL) 25 MG 24 hr tablet Take 1 tablet (25 mg total) by mouth daily. 90 tablet 2  . Multiple Vitamins-Minerals (CENTRUM SILVER ADULT 50+ PO) Take 1 capsule by mouth daily.    . rivaroxaban (XARELTO) 20 MG TABS tablet TAKE 1 TABLET DAILY WITH   SUPPER 90 tablet 1   No facility-administered medications prior to visit.    PAST MEDICAL HISTORY: Past Medical History:  Diagnosis Date  . CAD (coronary artery disease)    a. LHC 10/2014: nonobstructive disease, 30% LAD with bridging up to 50%.  . CHF (congestive heart failure) ([place]) 2016   "this was the reason for his pacemaker" (08/23/2017)  . Chronic pain   . Complete heart block ([place]) 11/07/2014   a. s/p [facility] Quadra Allure MP RF model [id number] (serial number [id number]) biventricular pacemaker 10/2014.  Marland Kitchen Dementia ([place])    "memory lapses q now and then; dx'd as delayed memory" (08/23/2017)  .  Depression   . Facial cellulitis 02/19/2009   "related to [facility] & having skin cancer zapped; took him off the [place]"  . History of hiatal hernia 1980s  . Hyperlipidemia   . Hypertension   . NICM (nonischemic cardiomyopathy) ([place])    a. 10/2014: EF 45%. (Prev 40-45% by echo 08/2014).  . Orthostatic hypotension   . Presence of permanent cardiac pacemaker   . Rheumatoid arthritis ([place])    "hands mainly; knees"  (08/23/2017)  . Seizures ([place]) 2008   "vs stroke; never determined which it was; on sz RX since"  (08/23/2017)  . Sleep apnea 2008   "gone since losing weight" (08/23/2017)  . Stroke [facility]) 2008   "vs seizure; never determined which it was; on sz RX since"    (08/23/2017)  . TIA (transient ischemic attack) 2008   "vs seizure; never determined which it was; on sz RX since"   (08/23/2017)    PAST SURGICAL HISTORY: Past Surgical History:  Procedure Laterality Date  . APPENDECTOMY  ~ 1988  . CARDIAC CATHETERIZATION  04/01/09   which showed low normal EF at 50% with question of underlying borderline area of minimal inferoapical hypercontractility. He had mild coronary obstructive disease with coronary calcification and segmental 20% narrowing in the LAD proximally, 20% in the mid LAD with muscle bridging. There was calcification at the ostium of the RCA, and 20 to 30%narrowing of the proximal to mid RCA with 30% distal n  . CARDIAC CATHETERIZATION N/A 11/07/2014   Procedure: Left Heart Cath and Coronary Angiography/ temp wire;  Surgeon: [name], MD;  Location: [facility];  Service: Cardiovascular;  Laterality: N/A;  . CARDIOVERSION N/A 07/19/2017   Procedure: CARDIOVERSION;  Surgeon: Acie Fredrickson Wonda Cheng, MD;  Location: [facility];  Service: Cardiovascular;  Laterality: N/A;  . CATARACT EXTRACTION W/ INTRAOCULAR LENS  IMPLANT, BILATERAL Bilateral early 2000's  . CHOLECYSTECTOMY OPEN  1980's  . EP IMPLANTABLE DEVICE N/A 11/08/2014   a. STJ CRTP implanted by Dr Rayann Heman  . EP IMPLANTABLE DEVICE N/A 11/10/2014   RA lead revision Dr Rayann Heman  . EXPLORATORY LAPAROTOMY  1990's   "put intestines back in & added mesh"  . FALSE ANEURYSM REPAIR Right 11/15/2014   Procedure: REPAIR OF RIGHT FEMORAL FALSE ANEURYSM ;  Surgeon: Rosetta Posner, MD;  Location: [facility];  Service: Vascular;  Laterality: Right;  . HERNIA REPAIR  1990s  . KNEE CARTILAGE SURGERY Left 1990's   "opened it up"  . TONSILLECTOMY      FAMILY HISTORY: Family History  Problem Relation Age of Onset  . Heart attack Father   . Cancer - Lung Sister   . Thyroid disease Child     SOCIAL HISTORY: Social History   Socioeconomic History  . Marital status: Married    Spouse name: [name]  .  Number of children: 5  . Years of education: College  . Highest education level: Not on file  Occupational History  . Not on file  Tobacco Use  . Smoking status: Never Smoker  . Smokeless tobacco: Never Used  Substance and Sexual Activity  . Alcohol use: Not Currently    Alcohol/week: 0.0 standard drinks  . Drug use: Never  . Sexual activity: Not on file  Other Topics Concern  . Not on file  Social History Narrative   Patient is married [name]) and lives at home with wife and two children.   Patient has five adult children.   Patient is retired.  Patient has a college education.   Patient is right-handed.   Patient drinks two cups of coffee daily, 1/2 can of soda daily and tea- 2-3 times per week.   Social Determinants of Health   Financial Resource Strain:   . Difficulty of Paying Living Expenses:   Food Insecurity:   . Worried About Charity fundraiser in the Last Year:   . Arboriculturist in the Last Year:   Transportation Needs:   . Film/video editor (Medical):   Marland Kitchen Lack of Transportation (Non-Medical):   Physical Activity:   . Days of Exercise per Week:   . Minutes of Exercise per Session:   Stress:   . Feeling of Stress :   Social Connections:   . Frequency of Communication with Friends and Family:   . Frequency of Social Gatherings with Friends and Family:   . Attends Religious Services:   . Active Member of Clubs or Organizations:   . Attends Archivist Meetings:   Marland Kitchen Marital Status:   Intimate Partner Violence:   . Fear of Current or Ex-Partner:   . Emotionally Abused:   Marland Kitchen Physically Abused:   . Sexually Abused:       PHYSICAL EXAM  Vitals:   05/21/19 1031  BP: 118/68  Pulse: 64  Temp: 97.7 F (36.5 C)  Weight: 183 lb (83 kg)  Height: 5\' 3"  (1.6 m)   Body mass index is 32.42 kg/m.   MMSE - Mini Mental State Exam 05/21/2019 11/15/2018 01/18/2018  Not completed: - - (No Data)  Orientation to time 0 0 1  Orientation to Place 1  2 4   Registration 3 3 3   Attention/ Calculation 0 0 1  Recall 0 0 0  Language- name 2 objects 1 2 2   Language- repeat 1 1 1   Language- follow 3 step command 3 3 0  Language- read & follow direction 1 1 0  Write a sentence 0 0 1  Copy design 0 0 1  Copy design-comments - named 3 animals. -  Total score 10 12 14      Generalized: Well developed, in no acute distress   Neurological examination  Mentation: Alert oriented to time, place, history taking. Follows all commands speech and language fluent Cranial nerve II-XII: Pupils were equal round reactive to light. Extraocular movements were full, visual field were full on confrontational test. Facial sensation and strength were normal. Uvula tongue midline. Head turning and shoulder shrug  were normal and symmetric. Motor: The motor testing reveals 5 over 5 strength of all 4 extremities. Good symmetric motor tone is noted throughout.  Sensory: Sensory testing is intact to soft touch on all 4 extremities. No evidence of extinction is noted.  Coordination: Cerebellar testing reveals good finger-nose-finger and heel-to-shin bilaterally.  Gait and station: Gait is normal. Tandem gait is normal. Romberg is negative. No drift is seen.  Reflexes: Deep tendon reflexes are symmetric and normal bilaterally.   DIAGNOSTIC DATA (LABS, IMAGING, TESTING) - I reviewed patient records, labs, notes, testing and imaging myself where available.  Lab Results  Component Value Date   WBC 7.3 11/03/2018   HGB 12.9 (L) 11/03/2018   HCT 37.7 11/03/2018   MCV 94 11/03/2018   PLT 308 11/03/2018      Component Value Date/Time   NA 137 11/03/2018 0950   K 4.4 11/03/2018 0950   CL 98 11/03/2018 0950   CO2 26 11/03/2018 0950   GLUCOSE 109 (H) 11/03/2018 0950  GLUCOSE 135 (H) 09/02/2017 1143   BUN 18 11/03/2018 0950   CREATININE 1.00 11/03/2018 0950   CREATININE 1.18 (H) 12/25/2014 1605   CALCIUM 9.2 11/03/2018 0950   PROT 6.4 04/11/2017 1525   ALBUMIN  3.7 04/11/2017 1525   AST 19 04/11/2017 1525   ALT 14 04/11/2017 1525   ALKPHOS 78 04/11/2017 1525   BILITOT 0.6 04/11/2017 1525   GFRNONAA 68 11/03/2018 0950   GFRAA 79 11/03/2018 0950   Lab Results  Component Value Date   CHOL 127 10/24/2014   HDL 42 10/24/2014   LDLCALC 62 10/24/2014   TRIG 113 10/24/2014   CHOLHDL 3.0 10/24/2014   No results found for: HGBA1C No results found for: VITAMINB12 Lab Results  Component Value Date   TSH 1.210 04/11/2017      ASSESSMENT AND PLAN 86 y.o. year old male  has a past medical history of CAD (coronary artery disease), CHF (congestive heart failure) ([place]) (2016), Chronic pain, Complete heart block ([place]) (11/07/2014), Dementia ([place]), Depression, Facial cellulitis (02/19/2009), History of hiatal hernia (1980s), Hyperlipidemia, Hypertension, NICM (nonischemic cardiomyopathy) ([place]), Orthostatic hypotension, Presence of permanent cardiac pacemaker, Rheumatoid arthritis ([place]), Seizures ([place]) (2008), Sleep apnea (2008), Stroke [facility]) (2008), and TIA (transient ischemic attack) (2008). here with :  1.  Memory disturbance  -MMSE 10/30 previously 13/30 -Continue Namenda 10 mg twice a day  2.  Seizures  -Continue Keppra XR 500 mg daily  Advised if symptoms worsen or he develops new symptoms he should let us know.  He will follow-up in 6 months or sooner if needed   I spent 25 minutes of face-to-face and non-face-to-face time with patient.  This included previsit chart review, lab review, study review, order entry, electronic health record documentation, patient education.  Ward Givens, MSN, NP-C 05/21/2019, 10:32 AM [facility] [address] [place], [place] [address] [phone]

## 2019-05-23 NOTE — Progress Notes (Signed)
I agree with the assessment and plan as directed by NP on this visit . I was available for consultation.   Cornelis Kluver, MD  

## 2019-06-05 ENCOUNTER — Ambulatory Visit (INDEPENDENT_AMBULATORY_CARE_PROVIDER_SITE_OTHER): Payer: Medicare Other

## 2019-06-05 DIAGNOSIS — Z950 Presence of cardiac pacemaker: Secondary | ICD-10-CM

## 2019-06-05 DIAGNOSIS — I5022 Chronic systolic (congestive) heart failure: Secondary | ICD-10-CM | POA: Diagnosis not present

## 2019-06-06 ENCOUNTER — Telehealth: Payer: Self-pay

## 2019-06-06 NOTE — Progress Notes (Signed)
EPIC Encounter for ICM Monitoring  Patient Name: Thomas Long is a 86 y.o. male Date: 06/06/2019 Primary Care Physican: Lajean Manes, MD Primary [place] Electrophysiologist: Allred Bi-V Pacing:>99%  AT/AF Burden:6.2% (taking Xarelto)   Spoke with wife and she said patient fell about 2 weeks ago but no injuries.  She said he does continue to sleep a lot.   CorVue thoracic impedancenormal.  No diuretic.  LABS: 04/07/2018 Creatinine 0.92, BUN 20, Potassium 4.2, Sodium 136, GFR 78-95 (received fax from Dr Carlyle Lipa office 3/3)  Recommendations: No changes and encouraged to call if experiencing any fluid symptoms.  Follow-up plan: ICM clinic phone appointment on6/11/2019.91 day device clinic remote transmission6/10/2019.   Copy of ICM check sent to Dr.Allred  3 month ICM trend: 06/06/2019    1 Year ICM trend:       Rosalene Billings, RN 06/06/2019 3:30 PM

## 2019-06-06 NOTE — Telephone Encounter (Signed)
Spoke with patient to remind of missed remote transmission 

## 2019-07-18 ENCOUNTER — Ambulatory Visit (INDEPENDENT_AMBULATORY_CARE_PROVIDER_SITE_OTHER): Payer: Medicare Other | Admitting: *Deleted

## 2019-07-18 DIAGNOSIS — I442 Atrioventricular block, complete: Secondary | ICD-10-CM | POA: Diagnosis not present

## 2019-07-18 DIAGNOSIS — I4819 Other persistent atrial fibrillation: Secondary | ICD-10-CM

## 2019-07-18 LAB — CUP PACEART REMOTE DEVICE CHECK
Battery Remaining Longevity: 34 mo
Battery Remaining Percentage: 50 %
Battery Voltage: 2.87 V
Brady Statistic AP VP Percent: 1 %
Brady Statistic AP VS Percent: 1 %
Brady Statistic AS VP Percent: 99 %
Brady Statistic AS VS Percent: 1 %
Brady Statistic RA Percent Paced: 1 %
Date Time Interrogation Session: 20210609102607
Implantable Lead Implant Date: 20160930
Implantable Lead Implant Date: 20160930
Implantable Lead Implant Date: 20160930
Implantable Lead Location: 753858
Implantable Lead Location: 753859
Implantable Lead Location: 753860
Implantable Pulse Generator Implant Date: 20160930
Lead Channel Impedance Value: 360 Ohm
Lead Channel Impedance Value: 660 Ohm
Lead Channel Impedance Value: 710 Ohm
Lead Channel Pacing Threshold Amplitude: 0.75 V
Lead Channel Pacing Threshold Amplitude: 0.75 V
Lead Channel Pacing Threshold Amplitude: 1.5 V
Lead Channel Pacing Threshold Pulse Width: 0.5 ms
Lead Channel Pacing Threshold Pulse Width: 0.5 ms
Lead Channel Pacing Threshold Pulse Width: 0.8 ms
Lead Channel Sensing Intrinsic Amplitude: 12 mV
Lead Channel Sensing Intrinsic Amplitude: 3.8 mV
Lead Channel Setting Pacing Amplitude: 2 V
Lead Channel Setting Pacing Amplitude: 2 V
Lead Channel Setting Pacing Amplitude: 2.75 V
Lead Channel Setting Pacing Pulse Width: 0.5 ms
Lead Channel Setting Pacing Pulse Width: 0.8 ms
Lead Channel Setting Sensing Sensitivity: 5 mV
Pulse Gen Model: 3262
Pulse Gen Serial Number: 7798436

## 2019-07-19 ENCOUNTER — Ambulatory Visit (INDEPENDENT_AMBULATORY_CARE_PROVIDER_SITE_OTHER): Payer: Medicare Other

## 2019-07-19 DIAGNOSIS — Z950 Presence of cardiac pacemaker: Secondary | ICD-10-CM | POA: Diagnosis not present

## 2019-07-19 DIAGNOSIS — I5022 Chronic systolic (congestive) heart failure: Secondary | ICD-10-CM | POA: Diagnosis not present

## 2019-07-19 NOTE — Progress Notes (Signed)
Remote pacemaker transmission.   

## 2019-07-20 ENCOUNTER — Telehealth: Payer: Self-pay

## 2019-07-20 NOTE — Progress Notes (Signed)
EPIC Encounter for ICM Monitoring  Patient Name: Thomas Long is a 86 y.o. male Date: 07/20/2019 Primary Care Physican: Lajean Manes, MD Primary [place] Electrophysiologist: Allred Bi-V Pacing:>99%  AT/AF Burden:6.0% (taking Xarelto)   Attempted call to patient/wife and unable to reach.   Transmission reviewed.   CorVue thoracic impedancenormal.  No diuretic.  LABS: 04/07/2018 Creatinine 0.92, BUN 20, Potassium 4.2, Sodium 136, GFR 78-95 (received fax from Dr Carlyle Lipa office 3/3)  Recommendations:Unable to reach.    Follow-up plan: ICM clinic phone appointment on7/01/2020.91 day device clinic remote transmission9/09/2019.   Copy of ICM check sent to Dr.Allred  3 month ICM trend: 07/18/2019    1 Year ICM trend:       Rosalene Billings, RN 07/20/2019 10:15 AM

## 2019-07-20 NOTE — Telephone Encounter (Signed)
Remote ICM transmission received.  Attempted call to patient regarding ICM remote transmission and no answer or answering machine. 

## 2019-07-29 ENCOUNTER — Other Ambulatory Visit: Payer: Self-pay | Admitting: Cardiovascular Disease

## 2019-08-20 ENCOUNTER — Ambulatory Visit (INDEPENDENT_AMBULATORY_CARE_PROVIDER_SITE_OTHER): Payer: Medicare Other

## 2019-08-20 DIAGNOSIS — I5022 Chronic systolic (congestive) heart failure: Secondary | ICD-10-CM | POA: Diagnosis not present

## 2019-08-20 DIAGNOSIS — Z950 Presence of cardiac pacemaker: Secondary | ICD-10-CM | POA: Diagnosis not present

## 2019-08-22 NOTE — Progress Notes (Signed)
EPIC Encounter for ICM Monitoring  Patient Name: Thomas Long is a 86 y.o. male Date: 08/22/2019 Primary Care Physican: Lajean Manes, MD Primary [place] Electrophysiologist: Allred Bi-V Pacing:>99%  AT/AF Burden:6.0%(taking Xarelto)   Transmission reviewed.   CorVue thoracic impedancenormal.  No diuretic.  LABS: 04/07/2018 Creatinine 0.92, BUN 20, Potassium 4.2, Sodium 136, GFR 78-95 (received fax from Dr Carlyle Lipa office 3/3)  Recommendations:None  Follow-up plan: ICM clinic phone appointment on8/16/2021.91 day device clinic remote transmission9/09/2019.   EP/Cardiology Office Visits: 01/12/2020 with Dr. Claiborne Billings.    Copy of ICM check sent to Dr. Rayann Heman.   3 month ICM trend: 08/20/2019    1 Year ICM trend:       Rosalene Billings, RN 08/22/2019 3:30 PM

## 2019-09-24 ENCOUNTER — Ambulatory Visit (INDEPENDENT_AMBULATORY_CARE_PROVIDER_SITE_OTHER): Payer: Medicare Other

## 2019-09-24 DIAGNOSIS — I5022 Chronic systolic (congestive) heart failure: Secondary | ICD-10-CM | POA: Diagnosis not present

## 2019-09-24 DIAGNOSIS — Z950 Presence of cardiac pacemaker: Secondary | ICD-10-CM | POA: Diagnosis not present

## 2019-09-27 ENCOUNTER — Telehealth: Payer: Self-pay

## 2019-09-27 NOTE — Telephone Encounter (Signed)
I spoke with the pt wife and she agreed to send the transmission. She states she is concern because the pt has been sleeping more lately and his blood pressure has been all over the place lately. I told her she can send the transmission and I will let Margarita Grizzle know to look for it in the morning and give them a call back. The pt wife verbalized understanding.

## 2019-09-28 NOTE — Progress Notes (Signed)
EPIC Encounter for ICM Monitoring  Patient Name: Thomas Long is a 86 y.o. male Date: 09/28/2019 Primary Care Physican: Lajean Manes, MD Primary [place] Electrophysiologist: Allred Bi-V Pacing:>99%  AT/AF Burden:5.8%(taking Xarelto)   Spoke with wife and reports feeling well at this time.  Denies fluid symptoms.  She said patient is sleeping a lot and BP has been fluctuating a lot.  Sometimes patient is not able to express his words sometimes. She is concerened about the BP and cognitive changes.  CorVue thoracic impedancenormal.  No diuretic.  LABS: 04/07/2018 Creatinine 0.92, BUN 20, Potassium 4.2, Sodium 136, GFR 78-95 (received fax from Dr Carlyle Lipa office 3/3)  Recommendations:No changes and encouraged to call if experiencing any fluid symptoms.  Advised to set up appointments with Dr Claiborne Billings regarding his BP and the neurologist regarding his cognitive issues.   Follow-up plan: ICM clinic phone appointment on9/20/2021.91 day device clinic remote transmission9/09/2019.   EP/Cardiology Office Visits: 01/12/2020 with Dr. Claiborne Billings.    Copy of ICM check sent to Dr. Rayann Heman.    3 month ICM trend: 09/27/2019    1 Year ICM trend:       Rosalene Billings, RN 09/28/2019 10:58 AM

## 2019-10-04 ENCOUNTER — Other Ambulatory Visit: Payer: Self-pay | Admitting: Physician Assistant

## 2019-10-04 ENCOUNTER — Other Ambulatory Visit: Payer: Self-pay | Admitting: Internal Medicine

## 2019-10-17 ENCOUNTER — Ambulatory Visit (INDEPENDENT_AMBULATORY_CARE_PROVIDER_SITE_OTHER): Payer: Medicare Other | Admitting: *Deleted

## 2019-10-17 DIAGNOSIS — I428 Other cardiomyopathies: Secondary | ICD-10-CM

## 2019-10-17 LAB — CUP PACEART REMOTE DEVICE CHECK
Battery Remaining Longevity: 31 mo
Battery Remaining Percentage: 44 %
Battery Voltage: 2.86 V
Brady Statistic AP VP Percent: 1 %
Brady Statistic AP VS Percent: 1 %
Brady Statistic AS VP Percent: 99 %
Brady Statistic AS VS Percent: 1 %
Brady Statistic RA Percent Paced: 1 %
Date Time Interrogation Session: 20210908093922
Implantable Lead Implant Date: 20160930
Implantable Lead Implant Date: 20160930
Implantable Lead Implant Date: 20160930
Implantable Lead Location: 753858
Implantable Lead Location: 753859
Implantable Lead Location: 753860
Implantable Pulse Generator Implant Date: 20160930
Lead Channel Impedance Value: 360 Ohm
Lead Channel Impedance Value: 710 Ohm
Lead Channel Impedance Value: 740 Ohm
Lead Channel Pacing Threshold Amplitude: 0.75 V
Lead Channel Pacing Threshold Amplitude: 0.75 V
Lead Channel Pacing Threshold Amplitude: 1.5 V
Lead Channel Pacing Threshold Pulse Width: 0.5 ms
Lead Channel Pacing Threshold Pulse Width: 0.5 ms
Lead Channel Pacing Threshold Pulse Width: 0.8 ms
Lead Channel Sensing Intrinsic Amplitude: 2.9 mV
Lead Channel Sensing Intrinsic Amplitude: 8.4 mV
Lead Channel Setting Pacing Amplitude: 2 V
Lead Channel Setting Pacing Amplitude: 2 V
Lead Channel Setting Pacing Amplitude: 2.75 V
Lead Channel Setting Pacing Pulse Width: 0.5 ms
Lead Channel Setting Pacing Pulse Width: 0.8 ms
Lead Channel Setting Sensing Sensitivity: 5 mV
Pulse Gen Model: 3262
Pulse Gen Serial Number: 7798436

## 2019-10-18 NOTE — Progress Notes (Signed)
Remote pacemaker transmission.   

## 2019-10-29 ENCOUNTER — Ambulatory Visit (INDEPENDENT_AMBULATORY_CARE_PROVIDER_SITE_OTHER): Payer: Medicare Other

## 2019-10-29 DIAGNOSIS — Z950 Presence of cardiac pacemaker: Secondary | ICD-10-CM | POA: Diagnosis not present

## 2019-10-29 DIAGNOSIS — I5022 Chronic systolic (congestive) heart failure: Secondary | ICD-10-CM

## 2019-10-31 NOTE — Progress Notes (Signed)
EPIC Encounter for ICM Monitoring  Patient Name: Thomas Long is a 86 y.o. male Date: 10/31/2019 Primary Care Physican: Lajean Manes, MD Primary [place] Electrophysiologist: Allred Bi-V Pacing:>99%  AT/AF Burden:5.7%(taking Xarelto)   Transmission reviewed  CorVue thoracic impedancenormal.  No diuretic.  LABS: 04/07/2018 Creatinine 0.92, BUN 20, Potassium 4.2, Sodium 136, GFR 78-95 (received fax from Dr Carlyle Lipa office 3/3)  Recommendations:No changes.    Follow-up plan: ICM clinic phone appointment on10/25/2021.91 day device clinic remote transmission12/09/2019.   EP/Cardiology Office Visits:01/12/2020 with Dr.Kelly.   Copy of ICM check sent to Dr.Allred.  3 month ICM trend: 10/29/2019    1 Year ICM trend:       Rosalene Billings, RN 10/31/2019 9:50 AM

## 2019-11-05 ENCOUNTER — Other Ambulatory Visit: Payer: Self-pay | Admitting: Physician Assistant

## 2019-11-24 ENCOUNTER — Other Ambulatory Visit: Payer: Self-pay | Admitting: Physician Assistant

## 2019-11-27 ENCOUNTER — Ambulatory Visit: Payer: Medicare Other | Admitting: Adult Health

## 2019-11-27 ENCOUNTER — Encounter: Payer: Self-pay | Admitting: Adult Health

## 2019-11-27 VITALS — BP 116/72 | HR 87 | Ht 63.0 in | Wt 163.9 lb

## 2019-11-27 DIAGNOSIS — R569 Unspecified convulsions: Secondary | ICD-10-CM

## 2019-11-27 DIAGNOSIS — F0390 Unspecified dementia without behavioral disturbance: Secondary | ICD-10-CM

## 2019-11-27 NOTE — Patient Instructions (Addendum)
Your Plan:  Continue namenda and keppra If your symptoms worsen or you develop new symptoms please let us know.   Thank you for coming to see [place] at [facility]. I hope we have been able to provide you high quality care today.  You may receive a patient satisfaction survey over the next few weeks. We would appreciate your feedback and comments so that we may continue to improve ourselves and the health of our patients.

## 2019-11-27 NOTE — Progress Notes (Signed)
PATIENT: Thomas Long DOB: [birth year]  REASON FOR VISIT: follow up HISTORY FROM: patient  HISTORY OF PRESENT ILLNESS: Today 11/27/19:  Thomas Long is an 86 year old male with a history of memory disturbance and seizures.  He returns today for follow-up.  He is here today with his daughter and wife.  They deny any seizure events.  He continues on Keppra.  The patient requires assistance with all ADLs.  His family manages his medications appointments and finances.  They have an aide that comes in 3 days a week.  Wife reports that in the last 3 weeks she has noticed an increase in urination and incontinent episodes.  He continues on Namenda.  Returns today for an evaluation.  HISTORY 05/21/19:  Thomas Long is an 86 year old male with a history of memory disturbance and seizures.  He returns today for follow-up.  He denies any seizure events.  He remains on Keppra.  Patient feels that his memory has remained stable.  He requires minimal supervision with ADLs.  He no longer operates a Teacher, music.  He is with his daughter today.  They are discussing getting more resources in the home to help with the patient.  They manage all of his medication and appointments.  His son comes to help him shower once a week.  Every other day his wife helps him with bathing.  He returns today for an evaluation.  REVIEW OF SYSTEMS: Out of a complete 14 system review of symptoms, the patient complains only of the following symptoms, and all other reviewed systems are negative.  See HPI  ALLERGIES: No Known Allergies  HOME MEDICATIONS: Outpatient Medications Prior to Visit  Medication Sig Dispense Refill  . acetaminophen (TYLENOL) 500 MG tablet Take 500 mg by mouth every 6 (six) hours as needed for moderate pain or headache.    Marland Kitchen atorvastatin (LIPITOR) 10 MG tablet TAKE 1 TABLET DAILY 90 tablet 2  . cetirizine (ZYRTEC) 10 MG tablet Take 10 mg by mouth daily.    Marland Kitchen dofetilide (TIKOSYN) 250 MCG capsule Take 1  capsule (250 mcg total) by mouth 2 (two) times daily. You are past due for an appt to see Dr. Rayann Heman.please call the office for appt. 1st attempt 60 capsule 0  . latanoprost (XALATAN) 0.005 % ophthalmic solution Place 1 drop into both eyes at bedtime.   3  . levETIRAcetam (KEPPRA XR) 500 MG 24 hr tablet TAKE 1 TABLET BY MOUTH EVERY DAY 90 tablet 3  . memantine (NAMENDA) 10 MG tablet TAKE 1 TABLET BY MOUTH TWICE A DAY 180 tablet 3  . metoprolol succinate (TOPROL-XL) 25 MG 24 hr tablet TAKE 1 TABLET BY MOUTH EVERY DAY 30 tablet 0  . Multiple Vitamins-Minerals (CENTRUM SILVER ADULT 50+ PO) Take 1 capsule by mouth daily.    Alveda Reasons 20 MG TABS tablet TAKE 1 TABLET BY MOUTH DAILY WITH SUPPER 90 tablet 1   No facility-administered medications prior to visit.    PAST MEDICAL HISTORY: Past Medical History:  Diagnosis Date  . CAD (coronary artery disease)    a. LHC 10/2014: nonobstructive disease, 30% LAD with bridging up to 50%.  . CHF (congestive heart failure) ([place]) 2016   "this was the reason for his pacemaker" (08/23/2017)  . Chronic pain   . Complete heart block ([place]) 11/07/2014   a. s/p [facility] Quadra Allure MP RF model [id number] (serial number [id number]) biventricular pacemaker 10/2014.  Marland Kitchen Dementia ([place])    "memory lapses q now  and then; dx'd as delayed memory" (08/23/2017)  . Depression   . Facial cellulitis 02/19/2009   "related to [facility] & having skin cancer zapped; took him off the [place]"  . History of hiatal hernia 1980s  . Hyperlipidemia   . Hypertension   . NICM (nonischemic cardiomyopathy) ([place])    a. 10/2014: EF 45%. (Prev 40-45% by echo 08/2014).  . Orthostatic hypotension   . Presence of permanent cardiac pacemaker   . Rheumatoid arthritis ([place])    "hands mainly; knees"  (08/23/2017)  . Seizures ([place]) 2008   "vs stroke; never determined which it was; on sz RX since"  (08/23/2017)  . Sleep apnea 2008   "gone since losing weight" (08/23/2017)  . Stroke [facility]) 2008   "vs  seizure; never determined which it was; on sz RX since"   (08/23/2017)  . TIA (transient ischemic attack) 2008   "vs seizure; never determined which it was; on sz RX since"   (08/23/2017)    PAST SURGICAL HISTORY: Past Surgical History:  Procedure Laterality Date  . APPENDECTOMY  ~ 1988  . CARDIAC CATHETERIZATION  04/01/09   which showed low normal EF at 50% with question of underlying borderline area of minimal inferoapical hypercontractility. He had mild coronary obstructive disease with coronary calcification and segmental 20% narrowing in the LAD proximally, 20% in the mid LAD with muscle bridging. There was calcification at the ostium of the RCA, and 20 to 30%narrowing of the proximal to mid RCA with 30% distal n  . CARDIAC CATHETERIZATION N/A 11/07/2014   Procedure: Left Heart Cath and Coronary Angiography/ temp wire;  Surgeon: [name], MD;  Location: [facility];  Service: Cardiovascular;  Laterality: N/A;  . CARDIOVERSION N/A 07/19/2017   Procedure: CARDIOVERSION;  Surgeon: Acie Fredrickson Wonda Cheng, MD;  Location: [facility];  Service: Cardiovascular;  Laterality: N/A;  . CATARACT EXTRACTION W/ INTRAOCULAR LENS  IMPLANT, BILATERAL Bilateral early 2000's  . CHOLECYSTECTOMY OPEN  1980's  . EP IMPLANTABLE DEVICE N/A 11/08/2014   a. STJ CRTP implanted by Dr Rayann Heman  . EP IMPLANTABLE DEVICE N/A 11/10/2014   RA lead revision Dr Rayann Heman  . EXPLORATORY LAPAROTOMY  1990's   "put intestines back in & added mesh"  . FALSE ANEURYSM REPAIR Right 11/15/2014   Procedure: REPAIR OF RIGHT FEMORAL FALSE ANEURYSM ;  Surgeon: Rosetta Posner, MD;  Location: [facility];  Service: Vascular;  Laterality: Right;  . HERNIA REPAIR  1990s  . KNEE CARTILAGE SURGERY Left 1990's   "opened it up"  . TONSILLECTOMY      FAMILY HISTORY: Family History  Problem Relation Age of Onset  . Heart attack Father   . Cancer - Lung Sister   . Thyroid disease Child     SOCIAL HISTORY: Social History   Socioeconomic  History  . Marital status: Married    Spouse name: [name]  . Number of children: 5  . Years of education: College  . Highest education level: Not on file  Occupational History  . Not on file  Tobacco Use  . Smoking status: Never Smoker  . Smokeless tobacco: Never Used  Vaping Use  . Vaping Use: Never used  Substance and Sexual Activity  . Alcohol use: Not Currently    Alcohol/week: 0.0 standard drinks  . Drug use: Never  . Sexual activity: Not on file  Other Topics Concern  . Not on file  Social History Narrative   Patient is married [name]) and lives at home with  wife and two children.   Patient has five adult children.   Patient is retired.   Patient has a college education.   Patient is right-handed.   Patient drinks two cups of coffee daily, 1/2 can of soda daily and tea- 2-3 times per week.   Social Determinants of Health   Financial Resource Strain:   . Difficulty of Paying Living Expenses: Not on file  Food Insecurity:   . Worried About Charity fundraiser in the Last Year: Not on file  . Ran Out of Food in the Last Year: Not on file  Transportation Needs:   . Lack of Transportation (Medical): Not on file  . Lack of Transportation (Non-Medical): Not on file  Physical Activity:   . Days of Exercise per Week: Not on file  . Minutes of Exercise per Session: Not on file  Stress:   . Feeling of Stress : Not on file  Social Connections:   . Frequency of Communication with Friends and Family: Not on file  . Frequency of Social Gatherings with Friends and Family: Not on file  . Attends Religious Services: Not on file  . Active Member of Clubs or Organizations: Not on file  . Attends Archivist Meetings: Not on file  . Marital Status: Not on file  Intimate Partner Violence:   . Fear of Current or Ex-Partner: Not on file  . Emotionally Abused: Not on file  . Physically Abused: Not on file  . Sexually Abused: Not on file      PHYSICAL EXAM  Vitals:     11/27/19 1020  BP: 116/72  Pulse: 87  Weight: 163 lb 14.4 oz (74.3 kg)  Height: 5\' 3"  (1.6 m)   Body mass index is 29.03 kg/m.   MMSE - Mini Mental State Exam 05/21/2019 11/15/2018 01/18/2018  Not completed: - - (No Data)  Orientation to time 0 0 1  Orientation to Place 1 2 4   Registration 3 3 3   Attention/ Calculation 0 0 1  Recall 0 0 0  Language- name 2 objects 1 2 2   Language- repeat 1 1 1   Language- follow 3 step command 3 3 0  Language- read & follow direction 1 1 0  Write a sentence 0 0 1  Copy design 0 0 1  Copy design-comments - named 3 animals. -  Total score 10 12 14      Generalized: Well developed, in no acute distress   Neurological examination  Mentation: Alert oriented to time, place, history taking. Follows all commands speech and language fluent Cranial nerve II-XII: Pupils were equal round reactive to light. Extraocular movements were full, visual field were full on confrontational test. Facial sensation and strength were normal. Uvula tongue midline. Head turning and shoulder shrug  were normal and symmetric. Motor: The motor testing reveals 5 over 5 strength of all 4 extremities. Good symmetric motor tone is noted throughout.  Sensory: Sensory testing is intact to soft touch on all 4 extremities. No evidence of extinction is noted.  Coordination: Cerebellar testing reveals good finger-nose-finger and heel-to-shin bilaterally.  Gait and station: Gait is normal. Tandem gait is normal. Romberg is negative. No drift is seen.  Reflexes: Deep tendon reflexes are symmetric and normal bilaterally.   DIAGNOSTIC DATA (LABS, IMAGING, TESTING) - I reviewed patient records, labs, notes, testing and imaging myself where available.  Lab Results  Component Value Date   WBC 7.3 11/03/2018   HGB 12.9 (L) 11/03/2018   HCT  37.7 11/03/2018   MCV 94 11/03/2018   PLT 308 11/03/2018      Component Value Date/Time   NA 137 11/03/2018 0950   K 4.4 11/03/2018 0950   CL  98 11/03/2018 0950   CO2 26 11/03/2018 0950   GLUCOSE 109 (H) 11/03/2018 0950   GLUCOSE 135 (H) 09/02/2017 1143   BUN 18 11/03/2018 0950   CREATININE 1.00 11/03/2018 0950   CREATININE 1.18 (H) 12/25/2014 1605   CALCIUM 9.2 11/03/2018 0950   PROT 6.4 04/11/2017 1525   ALBUMIN 3.7 04/11/2017 1525   AST 19 04/11/2017 1525   ALT 14 04/11/2017 1525   ALKPHOS 78 04/11/2017 1525   BILITOT 0.6 04/11/2017 1525   GFRNONAA 68 11/03/2018 0950   GFRAA 79 11/03/2018 0950   Lab Results  Component Value Date   CHOL 127 10/24/2014   HDL 42 10/24/2014   LDLCALC 62 10/24/2014   TRIG 113 10/24/2014   CHOLHDL 3.0 10/24/2014    Lab Results  Component Value Date   TSH 1.210 04/11/2017      ASSESSMENT AND PLAN 86 y.o. year old male  has a past medical history of CAD (coronary artery disease), CHF (congestive heart failure) ([place]) (2016), Chronic pain, Complete heart block ([place]) (11/07/2014), Dementia ([place]), Depression, Facial cellulitis (02/19/2009), History of hiatal hernia (1980s), Hyperlipidemia, Hypertension, NICM (nonischemic cardiomyopathy) ([place]), Orthostatic hypotension, Presence of permanent cardiac pacemaker, Rheumatoid arthritis ([place]), Seizures ([place]) (2008), Sleep apnea (2008), Stroke [facility]) (2008), and TIA (transient ischemic attack) (2008). here with:  1.  Seizures  -Continue Keppra  2.  Memory disturbance  -Continue Namenda discussed stopping the medication however patient's family prefers that he stays on it.  Follow-up in 6 months or sooner if needed  I spent 20 minutes of face-to-face and non-face-to-face time with patient.  This included previsit chart review, lab review, study review, order entry, electronic health record documentation, patient education.  Ward Givens, MSN, NP-C 11/27/2019, 10:34 AM [facility] [address] [place], [place] [address] [phone]

## 2019-12-01 ENCOUNTER — Other Ambulatory Visit: Payer: Self-pay | Admitting: Internal Medicine

## 2019-12-03 ENCOUNTER — Ambulatory Visit (INDEPENDENT_AMBULATORY_CARE_PROVIDER_SITE_OTHER): Payer: Medicare Other

## 2019-12-03 DIAGNOSIS — I5022 Chronic systolic (congestive) heart failure: Secondary | ICD-10-CM

## 2019-12-03 DIAGNOSIS — Z950 Presence of cardiac pacemaker: Secondary | ICD-10-CM | POA: Diagnosis not present

## 2019-12-10 NOTE — Progress Notes (Addendum)
EPIC Encounter for ICM Monitoring  Patient Name: LEIF LOFLIN is a 86 y.o. male Date: 12/10/2019 Primary Care Physican: Lajean Manes, MD Primary [place] Electrophysiologist: Allred Bi-V Pacing:>99%  AT/AF Burden:5.6%(taking Xarelto)   Transmission reviewed  CorVue thoracic impedancenormal.  No diuretic.  LABS: 04/07/2018 Creatinine 0.92, BUN 20, Potassium 4.2, Sodium 136, GFR 78-95 (received fax from Dr Carlyle Lipa office 3/3)  Recommendations:No changes.  Follow-up plan: ICM clinic phone appointment on11/30/2021.91 day device clinic remote transmission12/09/2019.   EP/Cardiology Office Visits:  04/03/2020 with Dr.Kelly.   Copy of ICM check sent to Dr.Allred.   3 month ICM trend: 12/03/2019    1 Year ICM trend:       Rosalene Billings, RN 12/10/2019 2:49 PM

## 2019-12-22 ENCOUNTER — Other Ambulatory Visit: Payer: Self-pay | Admitting: Internal Medicine

## 2019-12-23 ENCOUNTER — Ambulatory Visit: Payer: Medicare Other | Attending: Internal Medicine

## 2019-12-23 DIAGNOSIS — Z23 Encounter for immunization: Secondary | ICD-10-CM

## 2019-12-23 NOTE — Progress Notes (Signed)
   Covid-19 Vaccination Clinic  Name:  Thomas Long    MRN: [id number] DOB: February 19, 1931  12/23/2019  Mr. Thomas Long was observed post Covid-19 immunization for 15 minutes without incident. He was provided with Vaccine Information Sheet and instruction to access the V-Safe system.   Mr. Thomas Long was instructed to call 911 with any severe reactions post vaccine: Marland Kitchen Difficulty breathing  . Swelling of face and throat  . A fast heartbeat  . A bad rash all over body  . Dizziness and weakness   Immunizations Administered    No immunizations on file.

## 2019-12-25 ENCOUNTER — Other Ambulatory Visit: Payer: Self-pay | Admitting: Internal Medicine

## 2020-01-02 ENCOUNTER — Telehealth: Payer: Self-pay

## 2020-01-02 NOTE — Telephone Encounter (Signed)
Spoke with patient's daughter [name] and have scheduled an In-person Consult for 02/26/20 @ 11 AM   COVID screening was negative. No pets in home. Patient's daughter [name] lives with patient and patient's wife. Daughter requested this date and time for initial consult. She states she will call if anything changes with the patient. At this time he is stable, but worsening dementia.   Consent obtained; updated Outlook/Netsmart/Team List and Epic.

## 2020-01-02 NOTE — Telephone Encounter (Signed)
Left message for patient;s daughter [name] to return call to schedule Palliative consult appointment.

## 2020-01-03 ENCOUNTER — Other Ambulatory Visit: Payer: Self-pay | Admitting: Cardiovascular Disease

## 2020-01-08 ENCOUNTER — Ambulatory Visit (INDEPENDENT_AMBULATORY_CARE_PROVIDER_SITE_OTHER): Payer: Medicare Other

## 2020-01-08 DIAGNOSIS — Z950 Presence of cardiac pacemaker: Secondary | ICD-10-CM

## 2020-01-08 DIAGNOSIS — I5022 Chronic systolic (congestive) heart failure: Secondary | ICD-10-CM

## 2020-01-11 NOTE — Progress Notes (Signed)
EPIC Encounter for ICM Monitoring  Patient Name: Thomas Long is a 86 y.o. male Date: 01/11/2020 Primary Care Physican: Lajean Manes, MD Primary [place] Electrophysiologist: Allred Bi-V Pacing:>99%  AT/AF Burden:5.5%(taking Xarelto)   Transmission reviewed  CorVue thoracic impedancenormal.  No diuretic.  LABS: 04/07/2018 Creatinine 0.92, BUN 20, Potassium 4.2, Sodium 136, GFR 78-95 (received fax from Dr Carlyle Lipa office 3/3)  Recommendations:No changes.  Follow-up plan: ICM clinic phone appointment on1/04/2020.91 day device clinic remote transmission12/09/2019.   EP/Cardiology Office Visits:  04/03/2020 with Dr.Kelly.   Copy of ICM check sent to Dr.Allred.   3 month ICM trend: 01/09/2020    1 Year ICM trend:       Rosalene Billings, RN 01/11/2020 3:05 PM

## 2020-01-16 ENCOUNTER — Ambulatory Visit (INDEPENDENT_AMBULATORY_CARE_PROVIDER_SITE_OTHER): Payer: Medicare Other

## 2020-01-16 DIAGNOSIS — I428 Other cardiomyopathies: Secondary | ICD-10-CM | POA: Diagnosis not present

## 2020-01-16 LAB — CUP PACEART REMOTE DEVICE CHECK
Battery Remaining Longevity: 20 mo
Battery Remaining Percentage: 33 %
Battery Voltage: 2.83 V
Brady Statistic AP VP Percent: 1 %
Brady Statistic AP VS Percent: 1 %
Brady Statistic AS VP Percent: 99 %
Brady Statistic AS VS Percent: 1 %
Brady Statistic RA Percent Paced: 1 %
Date Time Interrogation Session: 20211208094834
Implantable Lead Implant Date: 20160930
Implantable Lead Implant Date: 20160930
Implantable Lead Implant Date: 20160930
Implantable Lead Location: 753858
Implantable Lead Location: 753859
Implantable Lead Location: 753860
Implantable Pulse Generator Implant Date: 20160930
Lead Channel Impedance Value: 360 Ohm
Lead Channel Impedance Value: 630 Ohm
Lead Channel Impedance Value: 710 Ohm
Lead Channel Pacing Threshold Amplitude: 0.75 V
Lead Channel Pacing Threshold Amplitude: 1 V
Lead Channel Pacing Threshold Amplitude: 1.5 V
Lead Channel Pacing Threshold Pulse Width: 0.5 ms
Lead Channel Pacing Threshold Pulse Width: 0.5 ms
Lead Channel Pacing Threshold Pulse Width: 0.8 ms
Lead Channel Sensing Intrinsic Amplitude: 3.1 mV
Lead Channel Sensing Intrinsic Amplitude: 5.8 mV
Lead Channel Setting Pacing Amplitude: 2 V
Lead Channel Setting Pacing Amplitude: 2 V
Lead Channel Setting Pacing Amplitude: 2.75 V
Lead Channel Setting Pacing Pulse Width: 0.5 ms
Lead Channel Setting Pacing Pulse Width: 0.8 ms
Lead Channel Setting Sensing Sensitivity: 5 mV
Pulse Gen Model: 3262
Pulse Gen Serial Number: 7798436

## 2020-01-18 ENCOUNTER — Telehealth: Payer: Self-pay | Admitting: Cardiovascular Disease

## 2020-01-18 NOTE — Telephone Encounter (Signed)
New message:     Patient wife calling to let the nurse know about medication change. Patient not eating that well. Please call patient wife.

## 2020-01-18 NOTE — Telephone Encounter (Signed)
Spoke to patient's wife she wanted to make Dr.Kelly aware of husband's condition.Stated he saw neurologist recently and did not get a good report .Stated his memory is worse.He is not eating well and sleeps more.Stated PCP advised since he eats more at breakfast ok to switch Xarelto to 20 mg every morning.She wants next visit with Dr.Kelly to be remote.Advised I will send message to Dr.Kelly.

## 2020-01-25 NOTE — Telephone Encounter (Signed)
I appreciate his wife letting me know of his condition.  Okay for next visit to be virtual

## 2020-01-25 NOTE — Telephone Encounter (Signed)
Wife updated and verbalized understanding. Appointment rescheduled for virtual visit on 03/27/19 at 9:40 am.

## 2020-01-29 NOTE — Progress Notes (Signed)
Remote pacemaker transmission.   

## 2020-02-04 ENCOUNTER — Other Ambulatory Visit: Payer: Self-pay | Admitting: Internal Medicine

## 2020-02-04 ENCOUNTER — Other Ambulatory Visit: Payer: Self-pay | Admitting: Cardiovascular Disease

## 2020-02-11 ENCOUNTER — Ambulatory Visit (INDEPENDENT_AMBULATORY_CARE_PROVIDER_SITE_OTHER): Payer: Medicare Other

## 2020-02-11 DIAGNOSIS — I5022 Chronic systolic (congestive) heart failure: Secondary | ICD-10-CM | POA: Diagnosis not present

## 2020-02-11 DIAGNOSIS — Z950 Presence of cardiac pacemaker: Secondary | ICD-10-CM

## 2020-02-12 ENCOUNTER — Telehealth: Payer: Self-pay

## 2020-02-12 NOTE — Progress Notes (Signed)
EPIC Encounter for ICM Monitoring  Patient Name: Thomas Long is a 87 y.o. male Date: 02/12/2020 Primary Care Physican: Merlene Laughter, MD Primary Cardiologist:Kelly Electrophysiologist: Allred Bi-V Pacing:>99%  AT/AF Burden:5.4%(taking Xarelto)   Spoke with wife.  Patient is feeling fine and no has not had any fluid symptoms.  CorVue thoracic impedancesuggesting possible fluid accumulation starting 02/07/2020 and returned close to baseline on 02/12/2020.  No diuretic.  LABS: 04/07/2018 Creatinine 0.92, BUN 20, Potassium 4.2, Sodium 136, GFR 78-95 (received fax from Dr Laverle Hobby office 3/3)  Recommendations:No changes and encouraged to call if experiencing any fluid symptoms.  Follow-up plan: ICM clinic phone appointment on2/08/2020.91 day device clinic remote transmission3/10/2020.   EP/Cardiology Office Visits:2/24/2022with Dr.Kelly.   Copy of ICM check sent to Dr.Allred.  3 month ICM trend: 02/12/2020.    1 Year ICM trend:       Karie Soda, RN 02/12/2020 1:22 PM

## 2020-02-12 NOTE — Telephone Encounter (Signed)
Remote ICM transmission received.  Attempted call to patient regarding ICM remote transmission and no answer.  

## 2020-02-26 ENCOUNTER — Other Ambulatory Visit: Payer: Self-pay | Admitting: Cardiovascular Disease

## 2020-02-26 ENCOUNTER — Other Ambulatory Visit: Payer: Self-pay | Admitting: Internal Medicine

## 2020-02-26 ENCOUNTER — Other Ambulatory Visit: Payer: Medicare Other | Admitting: Nurse Practitioner

## 2020-02-26 ENCOUNTER — Other Ambulatory Visit: Payer: Self-pay

## 2020-02-26 DIAGNOSIS — Z515 Encounter for palliative care: Secondary | ICD-10-CM

## 2020-02-26 DIAGNOSIS — F0150 Vascular dementia without behavioral disturbance: Secondary | ICD-10-CM

## 2020-02-26 NOTE — Progress Notes (Signed)
[facility] Consult Note Telephone: [phone]  Fax: [phone]  PATIENT NAME: Thomas Long [address] [place] [address] [phone] (home)  DOB: 04-16-1931 MRN: [id number]  PRIMARY CARE PROVIDER:    Lajean Manes, MD,  [place] [place] [address] [place] [address] [phone]  REFERRING PROVIDER:   Lajean Manes, MD [address] [place],  [place] [address] [phone]  RESPONSIBLE PARTY:   Extended Emergency Contact Information Primary Emergency Contact: Long,Thomas Address: [place], [place] Phone: [phone] Relation: Spouse Secondary Emergency Contact: Thomas Long [place] Mobile Phone: [phone] Relation: Daughter  Due to ongoing inclement weather resulting in icy roads, the patient and family have given their verbal consent for, a provider visit via tele-health using zoom from my home office. HIPPA policies of confidentially were reviewed and discussed.   ASSESSMENT AND RECOMMENDATIONS:   Advance Care Planning: Today's visit consisted of building trust and discussions on Palliative care medicine as a specialized medical care for people living with serious illness, aimed at facilitating better quality of life through symptoms relief, assisting with advance care planning and establishing goals of care. Family expressed appreciation for education provided on Palliative care and how that differs from Hospice service. Palliative care will continue to provide support to patient, family and the medical team. Goal of care: Patient goal of care is function and comfort. Family wants patient to be enjoy his time, enjoy his meals and maintain his current mobility/function. Directives: Patient has a living will which states that patient desires for a natural death. The need to complete a MOST form was discussed, patient and  daughter expressed interest in completing a MOST form, they however want to discuss discuss the sections with patient when he is more lucide and awake. Completing of the form was deferred to next visit in 4 weeks. Copies of MOST form sent to patient family. Patient's code status at this time is a full code, full resuscitation effort in the event of cardiac or respiratory arrest.  Symptom Management:  Insomnia: Family reported some difficulty falling and staying asleep. Recommend administering Melatonin 3mg  5mins to 1 hr before sleep. Dose may be increased to 5mg  if no good response to 3mg . Encouraged good bed time routines. Family report good appetite, denied pain, denied fever or chills. Denied any behavioral concerns. Questions and concerns were addressed. The patient/family was encouraged to call with questions and/or concerns, my business card was provided. Provided general support and encouragement, no other unmet needs identified.  Follow up Palliative Care Visit: Palliative care will continue to follow for goals of care clarification and symptom management. Return in about 4 weeks or prn.  Family /Caregiver/Community Supports: Patient lives at home with wife and daughter and son. He is originally from [place], relocated to [place] in 1975. Patient has 5 children. Patient has a paid caregiver who comes 3 hrs a day Mondays and Fridays, and 4 hrs on Wednesdays. Family report care giver is more for companionship as patient only allows his son to shower him.  Cognitive / Functional decline: Patient awake, falls asleep intermittently during visit, difficulty with word noted. Family report patient sleeps more now. Last fall 2 years ago. Patient ambulates with a walker.  I spent 60 minutes providing this consultation, time includes time spent with patient and family, chart review, provider coordination, and documentation. More  than 50% of the time in this consultation was spent counseling and  coordinating communication.   CHIEF COMPLAINT: Initial palliative care visit  History obtained from review of EMR, interview with family, caregiver and patient. Records reviewed and summarized bellow.  HISTORY OF PRESENT ILLNESS:  Thomas Long is a 87 y.o. year old male with multiple medical problems including Dementia (FAST 6c), seizures, CAD, CHF, chronic pain, complete heart block, dementia, depression, HTN, non- ischemic cardiomyopathy, has Rheumatoid arthritis, sleep apnea, hx of CVA. Palliative care was asked to follow this patient by consultation request of Thomas Manes, MD to help address advance care planning and goals of care.  CODE STATUS: Full code  PPS: 40%  HOSPICE ELIGIBILITY/DIAGNOSIS: TBD  PHYSICAL EXAM / ROS:  Current and past weights:  General:  frail appearing, siting in chair in NAD thin Cardiovascular: no chest pain reported, no report of palpitation  Pulmonary: no report of acute cough, no report of SOB, room air GI: appetite fair, no report of constipation, continent of bowel GU: denies dysuria, continent of urine MSK: ambulatory Skin: no rashes or wounds reported Neurological: weakness, difficulty word finding Psych: non -anxious affect  PAST MEDICAL HISTORY:  Past Medical History:  Diagnosis Date  . CAD (coronary artery disease)    a. LHC 10/2014: nonobstructive disease, 30% LAD with bridging up to 50%.  . CHF (congestive heart failure) ([place]) 2016   "this was the reason for his pacemaker" (08/23/2017)  . Chronic pain   . Complete heart block ([place]) 11/07/2014   a. s/p [facility] Quadra Allure MP RF model [id number] (serial number [id number]) biventricular pacemaker 10/2014.  Marland Kitchen Dementia ([facility])    "memory lapses q now and then; dx'd as delayed memory" (08/23/2017)  . Depression   . Facial cellulitis 02/19/2009   "related to [facility] & having skin cancer zapped; took him off the Embrel"  . History of hiatal hernia 1980s  . Hyperlipidemia   . Hypertension    . NICM (nonischemic cardiomyopathy) ([place])    a. 10/2014: EF 45%. (Prev 40-45% by echo 08/2014).  . Orthostatic hypotension   . Presence of permanent cardiac pacemaker   . Rheumatoid arthritis ([place])    "hands mainly; knees"  (08/23/2017)  . Seizures ([place]) 2008   "vs stroke; never determined which it was; on sz RX since"  (08/23/2017)  . Sleep apnea 2008   "gone since losing weight" (08/23/2017)  . Stroke [facility]) 2008   "vs seizure; never determined which it was; on sz RX since"   (08/23/2017)  . TIA (transient ischemic attack) 2008   "vs seizure; never determined which it was; on sz RX since"   (08/23/2017)    SOCIAL HX:  Social History   Tobacco Use  . Smoking status: Never Smoker  . Smokeless tobacco: Never Used  Substance Use Topics  . Alcohol use: Not Currently    Alcohol/week: 0.0 standard drinks   FAMILY HX:  Family History  Problem Relation Age of Onset  . Heart attack Father   . Cancer - Lung Sister   . Thyroid disease Child     ALLERGIES: No Known Allergies    PERTINENT MEDICATIONS:  Outpatient Encounter Medications as of 02/26/2020  Medication Sig  . acetaminophen (TYLENOL) 500 MG tablet Take 500 mg by mouth every 6 (six) hours as needed for moderate pain or headache.  Marland Kitchen atorvastatin (LIPITOR) 10 MG tablet TAKE 1 TABLET DAILY  . cetirizine (ZYRTEC) 10 MG tablet Take 10 mg by mouth  daily.  . dofetilide (TIKOSYN) 250 MCG capsule TAKE 1 CAPSULE BY MOUTH TWICE A DAY  . latanoprost (XALATAN) 0.005 % ophthalmic solution Place 1 drop into both eyes at bedtime.   . levETIRAcetam (KEPPRA XR) 500 MG 24 hr tablet TAKE 1 TABLET BY MOUTH EVERY DAY  . memantine (NAMENDA) 10 MG tablet TAKE 1 TABLET BY MOUTH TWICE A DAY  . metoprolol succinate (TOPROL-XL) 25 MG 24 hr tablet TAKE 1 TABLET BY MOUTH EVERY DAY  . Multiple Vitamins-Minerals (CENTRUM SILVER ADULT 50+ PO) Take 1 capsule by mouth daily.  Alveda Reasons 20 MG TABS tablet TAKE 1 TABLET BY MOUTH DAILY WITH SUPPER   No  facility-administered encounter medications on file as of 02/26/2020.    Thank you for the opportunity to participate in the care of Mr. Talitha Givens. The palliative care team will continue to follow. Please call our office at [phone] if we can be of additional assistance.   Jari Favre, DNP, AGPCNP-BC

## 2020-03-17 ENCOUNTER — Ambulatory Visit (INDEPENDENT_AMBULATORY_CARE_PROVIDER_SITE_OTHER): Payer: Medicare Other

## 2020-03-17 DIAGNOSIS — Z950 Presence of cardiac pacemaker: Secondary | ICD-10-CM | POA: Diagnosis not present

## 2020-03-17 DIAGNOSIS — I5022 Chronic systolic (congestive) heart failure: Secondary | ICD-10-CM | POA: Diagnosis not present

## 2020-03-22 ENCOUNTER — Other Ambulatory Visit: Payer: Self-pay | Admitting: Cardiovascular Disease

## 2020-03-24 NOTE — Progress Notes (Signed)
EPIC Encounter for ICM Monitoring  Patient Name: Thomas Long is a 87 y.o. male Date: 03/24/2020 Primary Care Physican: Lajean Manes, MD Primary [place] Electrophysiologist: Allred Bi-V Pacing:>99%  AT/AF Burden:5.4%(taking Xarelto)   Spoke with wife.  Patient is feeling fine and no has not had any fluid symptoms.  CorVue thoracic impedancesuggesting possible fluid accumulation starting 02/07/2020 and returned close to baseline on 02/12/2020.  No diuretic.  LABS: 04/07/2018 Creatinine 0.92, BUN 20, Potassium 4.2, Sodium 136, GFR 78-95 (received fax from Dr Carlyle Lipa office 3/3)  Recommendations:No changes and encouraged to call if experiencing any fluid symptoms.  Follow-up plan: ICM clinic phone appointment on2/08/2020.91 day device clinic remote transmission3/10/2020.   EP/Cardiology Office Visits:2/24/2022with Dr.Kelly.   Copy of ICM check sent to Dr.Allred.  3 month ICM Trend:    1 Year ICM trend:       Rosalene Billings, RN 03/24/2020 3:33 PM

## 2020-03-24 NOTE — Telephone Encounter (Signed)
52m, 74.3kg, scr 1.13 (01/01/20), ccr 49, lovw/kelly (01/12/19). Pt requesting 20mg  xarelto by only qualifies for 15 based on total ccr of 49. Will route to pharmd pool for review

## 2020-03-25 NOTE — Telephone Encounter (Signed)
CrCl 49.59mL/min, very close to cutoff. Will refill at current 20mg  dose, pt has f/u with TK in 2 months. Will reassess weight and renal fxn at that time, added note to appt to recheck labs. If trend continues with CrCl < 50, can reduce dose at that time.

## 2020-03-26 ENCOUNTER — Telehealth: Payer: Medicare Other | Admitting: Cardiovascular Disease

## 2020-04-01 ENCOUNTER — Other Ambulatory Visit: Payer: Medicare Other | Admitting: Nurse Practitioner

## 2020-04-01 ENCOUNTER — Other Ambulatory Visit: Payer: Self-pay

## 2020-04-01 DIAGNOSIS — F0390 Unspecified dementia without behavioral disturbance: Secondary | ICD-10-CM

## 2020-04-01 DIAGNOSIS — Z515 Encounter for palliative care: Secondary | ICD-10-CM

## 2020-04-01 NOTE — Progress Notes (Signed)
[facility] Consult Note Telephone: [phone]  Fax: [phone]  PATIENT NAME: Thomas Long [address] [place] [address] [phone] (home)  DOB: 1931/06/27 MRN: [id number]  PRIMARY CARE PROVIDER:    Lajean Manes, MD,  [place]. [address] [place] [address] [place] [address] [phone]  REFERRING PROVIDER:   Lajean Manes, MD [address] [place],  [place] [address] [phone]  RESPONSIBLE PARTY:   Extended Emergency Contact Information Primary Emergency Contact: Thomas Long Address: [place], [place] Phone: [phone] Relation: Spouse Secondary Emergency Contact: Thomas Long [place] Mobile Phone: [phone] Relation: Daughter  I met face to face with patient and family in home.   ASSESSMENT AND RECOMMENDATIONS:   Advance Care Planning: Reviewed ACP with patient's wife and daughter Thomas Long. Goal of care: Patient's goal of care is function and comfort. Family wants patient to enjoy the rest of the time with his family and at home. Directives: Family decided for patient to not be resuscitated in the event of cardiac or respiratory arrest. DNR form signed for patient today. Family verbalized desire to complete the MOST form today. Discussed and reviewed sections of the form in detail, opportunity for questions given, all questions answered. Form completed and signed with patient's wife, signed form given to family to keep. Palliative care will continue to provided support to patient, family and medical team.  Cognitive / Functional decline/ Symptom Management:  Patient awake, and alert during visit today. He is confused, struggled finding his words. Patient is totally dependent on family for all of his ADls, able to feed self. He ambulates with a front wheel walker, no report of falls. Family voiced no concerns for patient  today. Family report patient sleeping well with the use of Melatonin 14m at bedtime. Family report patient eating well. No report of acute illness in the last month since last palliative care visit, no report of uncontrolled pain.  Family had question about patient continued visit with Neurology, as visit causes patient agitation and patient not able to participate in the screenings. Discussed with family and made aware that patient at this time may discontinue Neurology visit if it is not in line with patient's goal of care of comfort. Patient on Namenda 119m medication may be managed by his PCP. Questions and concerns were addressed. Patient family was encouraged to call with questions and/or concerns. Provided general support and encouragement, no other unmet needs identified at this time.  Follow up Palliative Care Visit: Palliative care will continue to follow for complex decision making and symptom management. Return in about 4 weeks or prn.  Family /Caregiver/Community Supports: Patient lives at home with his family. He has a paid caregiver who comes 3 hrs a day Mondays and Fridays, and 4 hrs on Wednesdays. Family report care giver is more for companionship as patient only allows his son to shower him.  I spent 60 minutes providing this consultation, time includes time spent with patient and family, chart review, provider coordination, and documentation. More than 50% of the time in this consultation was spent counseling and coordinating communication.   CHIEF COMPLAINT: follow up palliative care visit  History obtained from review of EMR and discussion with patient's family. Records reviewed and summarized bellow.  HISTORY OF PRESENT ILLNESS:  Thomas FERRANDOs a [age].o. year old male with multiple medical problems including Dementia (  FAST 6c), seizures, CAD, CHF, chronic pain, complete heart block, dementia, depression, HTN, non- ischemic cardiomyopathy, has Rheumatoid arthritis, sleep apnea,  hx of CVA. Palliative care was asked to follow this patient to help address advance care planning and goals of care.  CODE STATUS: DNR   PPS: 40%  HOSPICE ELIGIBILITY/DIAGNOSIS: TBD  PHYSICAL EXAM / ROS:  General:  frail appearing, cooperative, siting in chair in NAD Cardiovascular: no chest pain reported, no report of palpitation  Pulmonary: no report of acute cough, no report of SOB, room air GI: appetite fair, no report of constipation, continent of bowel GU: denies dysuria, continent of urine MSK: ambulatory Skin: no rashes or wounds reported Neurological: weakness, difficulty word finding Psych: non -anxious affect   PAST MEDICAL HISTORY:  Past Medical History:  Diagnosis Date  . CAD (coronary artery disease)    a. LHC 10/2014: nonobstructive disease, 30% LAD with bridging up to 50%.  . CHF (congestive heart failure) ([place]) 2016   "this was the reason for his pacemaker" (08/23/2017)  . Chronic pain   . Complete heart block ([place]) 11/07/2014   a. s/p [facility] Quadra Allure MP RF model [id number] (serial number [id number]) biventricular pacemaker 10/2014.  Marland Kitchen Dementia ([place])    "memory lapses q now and then; dx'd as delayed memory" (08/23/2017)  . Depression   . Facial cellulitis 02/19/2009   "related to [facility] & having skin cancer zapped; took him off the Embrel"  . History of hiatal hernia 1980s  . Hyperlipidemia   . Hypertension   . NICM (nonischemic cardiomyopathy) ([place])    a. 10/2014: EF 45%. (Prev 40-45% by echo 08/2014).  . Orthostatic hypotension   . Presence of permanent cardiac pacemaker   . Rheumatoid arthritis ([place])    "hands mainly; knees"  (08/23/2017)  . Seizures ([place]) 2008   "vs stroke; never determined which it was; on sz RX since"  (08/23/2017)  . Sleep apnea 2008   "gone since losing weight" (08/23/2017)  . Stroke [facility]) 2008   "vs seizure; never determined which it was; on sz RX since"   (08/23/2017)  . TIA (transient ischemic attack) 2008   "vs seizure;  never determined which it was; on sz RX since"   (08/23/2017)    SOCIAL HX:  Social History   Tobacco Use  . Smoking status: Never Smoker  . Smokeless tobacco: Never Used  Substance Use Topics  . Alcohol use: Not Currently    Alcohol/week: 0.0 standard drinks   FAMILY HX:  Family History  Problem Relation Age of Onset  . Heart attack Father   . Cancer - Lung Sister   . Thyroid disease Child     ALLERGIES: No Known Allergies   PERTINENT MEDICATIONS:  Outpatient Encounter Medications as of 04/01/2020  Medication Sig  . acetaminophen (TYLENOL) 500 MG tablet Take 500 mg by mouth every 6 (six) hours as needed for moderate pain or headache.  Marland Kitchen atorvastatin (LIPITOR) 10 MG tablet TAKE 1 TABLET DAILY  . cetirizine (ZYRTEC) 10 MG tablet Take 10 mg by mouth daily.  Marland Kitchen dofetilide (TIKOSYN) 250 MCG capsule TAKE 1 CAPSULE BY MOUTH TWICE A DAY  . latanoprost (XALATAN) 0.005 % ophthalmic solution Place 1 drop into both eyes at bedtime.   . levETIRAcetam (KEPPRA XR) 500 MG 24 hr tablet TAKE 1 TABLET BY MOUTH EVERY DAY  . memantine (NAMENDA) 10 MG tablet TAKE 1 TABLET BY MOUTH TWICE A DAY  . metoprolol succinate (TOPROL-XL) 25 MG 24 hr  tablet TAKE 1 TABLET BY MOUTH EVERY DAY  . Multiple Vitamins-Minerals (CENTRUM SILVER ADULT 50+ PO) Take 1 capsule by mouth daily.  Alveda Reasons 20 MG TABS tablet TAKE 1 TABLET BY MOUTH DAILY WITH SUPPER   No facility-administered encounter medications on file as of 04/01/2020.    Thank you for the opportunity to participate in the care of Mr. [name]. The palliative care team will continue to follow. Please call our office at [phone] if we can be of additional assistance.  [place] Lodge, DNP, AGPCNP-BC

## 2020-04-03 ENCOUNTER — Ambulatory Visit: Payer: Medicare Other | Admitting: Cardiovascular Disease

## 2020-04-08 ENCOUNTER — Other Ambulatory Visit: Payer: Self-pay | Admitting: Adult Health

## 2020-04-13 ENCOUNTER — Other Ambulatory Visit: Payer: Self-pay

## 2020-04-14 MED ORDER — DOFETILIDE 250 MCG PO CAPS: 250.0000 ug | ORAL_CAPSULE | Freq: Two times a day (BID) | ORAL | 1 refills | Status: DC

## 2020-04-14 NOTE — Telephone Encounter (Signed)
This med SHOULD be refilled so that patient doesn't run out, which would further complicate matters.    He needs a visit IN OFFICE with EKG and labs ASAP for monitoring his tikosyn therapy. (This week ideally)  Legrand Como "Oda Kilts, [place]  04/14/2020 8:53 AM

## 2020-04-14 NOTE — Telephone Encounter (Signed)
Patient has not been seen by EP since Sept 2020 or by Dr. Claiborne Billings since Dec 2020. Please verify refill of Tikosyn.

## 2020-04-16 ENCOUNTER — Ambulatory Visit (INDEPENDENT_AMBULATORY_CARE_PROVIDER_SITE_OTHER): Payer: Medicare Other

## 2020-04-16 DIAGNOSIS — I428 Other cardiomyopathies: Secondary | ICD-10-CM

## 2020-04-18 LAB — CUP PACEART REMOTE DEVICE CHECK
Battery Remaining Longevity: 18 mo
Battery Remaining Percentage: 28 %
Battery Voltage: 2.81 V
Brady Statistic AP VP Percent: 1 %
Brady Statistic AP VS Percent: 1 %
Brady Statistic AS VP Percent: 99 %
Brady Statistic AS VS Percent: 1 %
Brady Statistic RA Percent Paced: 1 %
Date Time Interrogation Session: 20220309100057
Implantable Lead Implant Date: 20160930
Implantable Lead Implant Date: 20160930
Implantable Lead Implant Date: 20160930
Implantable Lead Location: 753858
Implantable Lead Location: 753859
Implantable Lead Location: 753860
Implantable Pulse Generator Implant Date: 20160930
Lead Channel Impedance Value: 360 Ohm
Lead Channel Impedance Value: 640 Ohm
Lead Channel Impedance Value: 710 Ohm
Lead Channel Pacing Threshold Amplitude: 0.75 V
Lead Channel Pacing Threshold Amplitude: 1 V
Lead Channel Pacing Threshold Amplitude: 1.5 V
Lead Channel Pacing Threshold Pulse Width: 0.5 ms
Lead Channel Pacing Threshold Pulse Width: 0.5 ms
Lead Channel Pacing Threshold Pulse Width: 0.8 ms
Lead Channel Sensing Intrinsic Amplitude: 11.2 mV
Lead Channel Sensing Intrinsic Amplitude: 2.9 mV
Lead Channel Setting Pacing Amplitude: 2 V
Lead Channel Setting Pacing Amplitude: 2 V
Lead Channel Setting Pacing Amplitude: 2.75 V
Lead Channel Setting Pacing Pulse Width: 0.5 ms
Lead Channel Setting Pacing Pulse Width: 0.8 ms
Lead Channel Setting Sensing Sensitivity: 5 mV
Pulse Gen Model: 3262
Pulse Gen Serial Number: 7798436

## 2020-04-21 ENCOUNTER — Ambulatory Visit (INDEPENDENT_AMBULATORY_CARE_PROVIDER_SITE_OTHER): Payer: Medicare Other

## 2020-04-21 DIAGNOSIS — Z950 Presence of cardiac pacemaker: Secondary | ICD-10-CM | POA: Diagnosis not present

## 2020-04-21 DIAGNOSIS — I5022 Chronic systolic (congestive) heart failure: Secondary | ICD-10-CM | POA: Diagnosis not present

## 2020-04-22 NOTE — Progress Notes (Signed)
EPIC Encounter for ICM Monitoring  Patient Name: Thomas Long is a 87 y.o. male Date: 04/22/2020 Primary Care Physican: Lajean Manes, MD Primary [place] Electrophysiologist: Allred Bi-V Pacing:>99%  AT/AF Burden:5.2%(taking Xarelto)   Transmission reviewed.  CorVue thoracic impedancesuggesting normal fluid levels.  No diuretic.  Recommendations:No changes.  Follow-up plan: ICM clinic phone appointment on4/18/2022.91 day device clinic remote transmission6/09/2020.   EP/Cardiology Office Visits:05/02/2020 with Dillon Bjork, PA.  4/18/2022with Dr.Kelly.   Copy of ICM check sent to Dr.Allred.  3 month ICM trend: 04/21/2020.    1 Year ICM trend:       Rosalene Billings, RN 04/22/2020 10:56 AM

## 2020-04-24 NOTE — Progress Notes (Signed)
Remote pacemaker transmission.   

## 2020-04-30 ENCOUNTER — Other Ambulatory Visit: Payer: Self-pay

## 2020-04-30 ENCOUNTER — Other Ambulatory Visit: Payer: Medicare Other | Admitting: Nurse Practitioner

## 2020-04-30 DIAGNOSIS — F0280 Dementia in other diseases classified elsewhere without behavioral disturbance: Secondary | ICD-10-CM

## 2020-04-30 DIAGNOSIS — Z515 Encounter for palliative care: Secondary | ICD-10-CM

## 2020-04-30 NOTE — Progress Notes (Signed)
[facility] Consult Note Telephone: [phone]  Fax: [phone]  PATIENT NAME: Thomas Long [address] [place] [address] [phone] (home)  DOB: 07-07-1931 MRN: [id number]  PRIMARY CARE PROVIDER:    Lajean Manes, MD,  [place] [place] [address] [phone]  REFERRING PROVIDER:   Lajean Manes, MD [address] [place],  [place] [address] [phone]  RESPONSIBLE PARTY:   Extended Emergency Contact Information Primary Emergency Contact: Parveen,Eileen Address: [place], [place] Phone: [phone] Relation: Spouse Secondary Emergency Contact: Nena [place] Mobile Phone: [phone] Relation: Daughter  I met face to face with patient and family in home. Ebony caregiver from [place] present at visit.    ASSESSMENT AND RECOMMENDATIONS:   Advance Care Planning: Goal of care: Patient's goal of care is comfort while preserving function. Family wants patient to enjoy the rest of the time with his family and at home Directives: Signed DNR and MOST form in home.   Symptom Management:  Dementia: No report of change in condition since last visit.  Wife report patient sleeping better and eating well. Patient still on Xarelto without report of acute bleed. Patient going for routine pace maker check in 2 days, last 91 days device clinic remote transmission was on 04/16/2020. No report of fever, chills, or SOB, no report of acute illness in the last month. Questions and concerns were addressed. Family encouraged to call with questions and/or concerns. Provided general support and encouragement, no other unmet needs identified at this time.  Follow up Palliative Care Visit: Palliative care will continue to follow for complex decision making and symptom management. Return in about weeks or prn.  Family  /Caregiver/Community Supports: Patient used to be a Airline pilot, lives at home with his wife, has paid caregiver from [place] 3 hrs a day Mondays and Fridays, and 4 hrs on Wednesdays.   Cognitive / Functional decline:  Patient awake, and alert during visit today. Pleasantly confused, speaks in few words. Family report difficulty remembering family members. Patient is totally dependent on family for all of his ADls, still continent of bowel and bladder, able to feed self, ambulates with a front wheel walker, no report of falls.   I spent 35 minutes providing this consultation, time includes time spent with patient and family, chart review, provider coordination, and documentation. More than 50% of the time in this consultation was spent counseling and coordinating communication.   CHIEF COMPLAINT: Follow up palliative care visit  History obtained from review of EMR and discussion with patient and family. Records reviewed and summarized bellow.  HISTORY OF PRESENT ILLNESS:Thomas Long a 87 y.o.year old malewith multiple medical problems including Dementia(FAST 6c), seizures, CAD, CHF, chronic pain, complete heart block s/p pace maker, dementia, depression, HTN, non- ischemic cardiomyopathy, Rheumatoid arthritis, sleep apnea, hx of CVA. Palliativecare was asked to follow this patient to help address advance care planning, goals of care and symptoms management.This is a follow up visit from 04/01/2020.  CODE STATUS: DNR  PPS: 40%  HOSPICE ELIGIBILITY/DIAGNOSIS: TBD  Review of Systems:  All systems reviewed and are negative except as documented in history of present illness above.   Physical Exam: Current and past weights: No recent weight reported, 163lbs, BMI 29.03kg/m2 on 11/27/2019 General: frail appearing, cooperative, sitting in chair in NAD EYES: anicteric sclera, lids intact,  no discharge  ENMT: intact hearing, oral mucous membranes moist CV:  no LE  edema Pulmonary: no increased work of breathing, no cough, no audible wheezes, oxygen saturation 98% on room air Abdomen: no ascites GU: deferred MSK:  no contractures, ambulatory Skin: warm and dry, no rashes or wounds on visible skin Neuro: Generalized weakness, moderate cognitive impairment Psych: non-anxious affect today, A and O x 1 Hem/lymph/immuno: no widespread bruising   PAST MEDICAL HISTORY:  Past Medical History:  Diagnosis Date  . CAD (coronary artery disease)    a. LHC 10/2014: nonobstructive disease, 30% LAD with bridging up to 50%.  . CHF (congestive heart failure) ([place]) 2016   "this was the reason for his pacemaker" (08/23/2017)  . Chronic pain   . Complete heart block ([place]) 11/07/2014   a. s/p [facility] Quadra Allure MP RF model [id number] (serial number [id number]) biventricular pacemaker 10/2014.  Marland Kitchen Dementia ([place])    "memory lapses q now and then; dx'd as delayed memory" (08/23/2017)  . Depression   . Facial cellulitis 02/19/2009   "related to [facility] & having skin cancer zapped; took him off the Embrel"  . History of hiatal hernia 1980s  . Hyperlipidemia   . Hypertension   . NICM (nonischemic cardiomyopathy) ([place])    a. 10/2014: EF 45%. (Prev 40-45% by echo 08/2014).  . Orthostatic hypotension   . Presence of permanent cardiac pacemaker   . Rheumatoid arthritis ([place])    "hands mainly; knees"  (08/23/2017)  . Seizures ([place]) 2008   "vs stroke; never determined which it was; on sz RX since"  (08/23/2017)  . Sleep apnea 2008   "gone since losing weight" (08/23/2017)  . Stroke [facility]) 2008   "vs seizure; never determined which it was; on sz RX since"   (08/23/2017)  . TIA (transient ischemic attack) 2008   "vs seizure; never determined which it was; on sz RX since"   (08/23/2017)    SOCIAL HX:  Social History   Tobacco Use  . Smoking status: Never Smoker  . Smokeless tobacco: Never Used  Substance Use Topics  . Alcohol use: Not Currently    Alcohol/week: 0.0 standard  drinks   FAMILY HX:  Family History  Problem Relation Age of Onset  . Heart attack Father   . Cancer - Lung Sister   . Thyroid disease Child     ALLERGIES: No Known Allergies   PERTINENT MEDICATIONS:  Outpatient Encounter Medications as of 04/30/2020  Medication Sig  . acetaminophen (TYLENOL) 500 MG tablet Take 500 mg by mouth every 6 (six) hours as needed for moderate pain or headache.  Marland Kitchen atorvastatin (LIPITOR) 10 MG tablet TAKE 1 TABLET DAILY  . cetirizine (ZYRTEC) 10 MG tablet Take 10 mg by mouth daily.  Marland Kitchen dofetilide (TIKOSYN) 250 MCG capsule Take 1 capsule (250 mcg total) by mouth 2 (two) times daily.  Marland Kitchen latanoprost (XALATAN) 0.005 % ophthalmic solution Place 1 drop into both eyes at bedtime.   . levETIRAcetam (KEPPRA XR) 500 MG 24 hr tablet TAKE 1 TABLET BY MOUTH EVERY DAY  . memantine (NAMENDA) 10 MG tablet TAKE 1 TABLET BY MOUTH TWICE A DAY  . metoprolol succinate (TOPROL-XL) 25 MG 24 hr tablet TAKE 1 TABLET BY MOUTH EVERY DAY  . Multiple Vitamins-Minerals (CENTRUM SILVER ADULT 50+ PO) Take 1 capsule by mouth daily.  Alveda Reasons 20 MG TABS tablet TAKE 1 TABLET BY MOUTH DAILY WITH SUPPER   No facility-administered encounter medications on file as of 04/30/2020.  Thank you for the opportunity to participate in the care of Mr. [name]. The palliative care team will continue to follow. Please call our office at [phone] if we can be of additional assistance.  Jari Favre, DNP, AGPCNP-BC

## 2020-05-01 NOTE — Progress Notes (Signed)
Cardiology Office Note Date:  05/01/2020  Patient ID:  Thomas Long, Thomas Long 01/02/1932, MRN [id number] PCP:  Lajean Manes, MD  Cardiologist:  Dr. Claiborne Billings Electrophysiologist: Dr. Rayann Heman   Chief Complaint:  over due visit  History of Present Illness: Thomas Long is a 87 y.o. 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 Rayann Heman, last seen by him Dec 2019, doing well, no changes were made with plans to follow up with EP APP q6 months and Dr. Claiborne Billings  He saw A. Tillery, PA, 10/2018, noted functional decline and fatigue, likely multifactorial, Afib burden <1% and normally functioning device. Echo November LVEF 55-60%, RV OK, no sign VHD  He saw Dr. Claiborne Billings 01/12/2019, was doing OK, accompanied by his daughter noting some nighttime confusion, no changes were made.    TODAY He is accompanied by his daughter that he lives with (along withhis wife) She mentions that he has had advancing dementia, is ambulatory though not without supervision, and has not had any falls. All in all she feels like he is doing pretty good. The patient denies any kind of CP, palpitations or cardiac concerns. No SOB No dizzy spells, near syncope or syncope  No bleeding or signs of bleeding have been noted    Device information STJ CRTP implanted9/30/2016 Atrial lead dislodged requiring revision 11/10/2014  AFib Hx Diagnosed via his device Oct 2016  AAD Tikosyn started July 2019   Past Medical History:  Diagnosis Date  . CAD (coronary artery disease)    a. LHC 10/2014: nonobstructive disease, 30% LAD with bridging up to 50%.  . CHF (congestive heart failure) ([place]) 2016   "this was the reason for his pacemaker" (08/23/2017)  . Chronic pain   . Complete heart block ([place])  11/07/2014   a. s/p [facility] Quadra Allure MP RF model [id number] (serial number [id number]) biventricular pacemaker 10/2014.  Marland Kitchen Dementia ([place])    "memory lapses q now and then; dx'd as delayed memory" (08/23/2017)  . Depression   . Facial cellulitis 02/19/2009   "related to [facility] & having skin cancer zapped; took him off the Embrel"  . History of hiatal hernia 1980s  . Hyperlipidemia   . Hypertension   . NICM (nonischemic cardiomyopathy) ([place])    a. 10/2014: EF 45%. (Prev 40-45% by echo 08/2014).  . Orthostatic hypotension   . Presence of permanent cardiac pacemaker   . Rheumatoid arthritis ([place])    "hands mainly; knees"  (08/23/2017)  . Seizures ([place]) 2008   "vs stroke; never determined which it was; on sz RX since"  (08/23/2017)  . Sleep apnea 2008   "gone since losing weight" (08/23/2017)  . Stroke [facility]) 2008   "vs seizure; never determined which it was; on sz RX since"   (08/23/2017)  . TIA (transient ischemic attack) 2008   "vs seizure; never determined which it was; on sz RX since"   (08/23/2017)    Past Surgical History:  Procedure Laterality Date  . APPENDECTOMY  ~ 1988  . CARDIAC CATHETERIZATION  04/01/09   which showed low normal EF at 50% with question of underlying borderline area of minimal inferoapical hypercontractility. He had mild coronary obstructive disease with coronary calcification and segmental 20% narrowing in the LAD proximally, 20% in the mid LAD with muscle  bridging. There was calcification at the ostium of the RCA, and 20 to 30%narrowing of the proximal to mid RCA with 30% distal n  . CARDIAC CATHETERIZATION N/A 11/07/2014   Procedure: Left Heart Cath and Coronary Angiography/ temp wire;  Surgeon: [name], MD;  Location: [place] [facility] CV LAB;  Service: Cardiovascular;  Laterality: N/A;  . CARDIOVERSION N/A 07/19/2017   Procedure: CARDIOVERSION;  Surgeon: Acie Fredrickson Wonda Cheng, MD;  Location: [facility];  Service: Cardiovascular;  Laterality: N/A;  . CATARACT  EXTRACTION W/ INTRAOCULAR LENS  IMPLANT, BILATERAL Bilateral early 2000's  . CHOLECYSTECTOMY OPEN  1980's  . EP IMPLANTABLE DEVICE N/A 11/08/2014   a. STJ CRTP implanted by Dr Rayann Heman  . EP IMPLANTABLE DEVICE N/A 11/10/2014   RA lead revision Dr Rayann Heman  . EXPLORATORY LAPAROTOMY  1990's   "put intestines back in & added mesh"  . FALSE ANEURYSM REPAIR Right 11/15/2014   Procedure: REPAIR OF RIGHT FEMORAL FALSE ANEURYSM ;  Surgeon: Rosetta Posner, MD;  Location: [facility];  Service: Vascular;  Laterality: Right;  . HERNIA REPAIR  1990s  . KNEE CARTILAGE SURGERY Left 1990's   "opened it up"  . TONSILLECTOMY      Current Outpatient Medications  Medication Sig Dispense Refill  . acetaminophen (TYLENOL) 500 MG tablet Take 500 mg by mouth every 6 (six) hours as needed for moderate pain or headache.    Marland Kitchen atorvastatin (LIPITOR) 10 MG tablet TAKE 1 TABLET DAILY 90 tablet 2  . cetirizine (ZYRTEC) 10 MG tablet Take 10 mg by mouth daily.    Marland Kitchen dofetilide (TIKOSYN) 250 MCG capsule Take 1 capsule (250 mcg total) by mouth 2 (two) times daily. 60 capsule 1  . latanoprost (XALATAN) 0.005 % ophthalmic solution Place 1 drop into both eyes at bedtime.   3  . levETIRAcetam (KEPPRA XR) 500 MG 24 hr tablet TAKE 1 TABLET BY MOUTH EVERY DAY 90 tablet 3  . memantine (NAMENDA) 10 MG tablet TAKE 1 TABLET BY MOUTH TWICE A DAY 180 tablet 3  . metoprolol succinate (TOPROL-XL) 25 MG 24 hr tablet TAKE 1 TABLET BY MOUTH EVERY DAY 30 tablet 11  . Multiple Vitamins-Minerals (CENTRUM SILVER ADULT 50+ PO) Take 1 capsule by mouth daily.    Alveda Reasons 20 MG TABS tablet TAKE 1 TABLET BY MOUTH DAILY WITH SUPPER 90 tablet 0   No current facility-administered medications for this visit.    Allergies:   Patient has no known allergies.   Social History:  The patient  reports that he has never smoked. He has never used smokeless tobacco. He reports previous alcohol use. He reports that he does not use drugs.   Family History:  The patient's  family history includes Cancer - Lung in his sister; Heart attack in his father; Thyroid disease in his child.  ROS:  Please see the history of present illness.    All other systems are reviewed and otherwise negative.   PHYSICAL EXAM:  VS:  There were no vitals taken for this visit. BMI: There is no height or weight on file to calculate BMI. Well nourished, well developed, elderly and somewhat chronically ill appearing, in no acute distress HEENT: normocephalic, atraumatic Neck: no JVD, carotid bruits or masses Cardiac:  RRR; no significant murmurs, no rubs, or gallops Lungs:  CTA b/l, no wheezing, rhonchi or rales Abd: soft, nontender MS: no deformity, age appropriate/perhaps advanced atrophy Ext: no edema Skin: warm and dry, no rash Neuro:  No gross deficits appreciated  Psych: euthymic mood, full affect  PPM site is stable, no tethering or discomfort   EKG:  Done today and reviewed by myself shows  SR/VP 87bpm, manually measured QRS 156ms, QT 489ms QTc allowing for QRS duration is OK, 451ms  Device interrogation done today and reviewed by myself:  Battery estimate is 1.3 years Lead measurements are good No R waves today at 40bpm + AMS Burden 5.2% (since 2019) Longest 3 hours Feb 2020 Most episodes duration <1 minute  12/29/2018: TTE IMPRESSIONS  1. Left ventricular ejection fraction, by visual estimation, is 55 to  60%. The left ventricle has normal function. There is mildly increased  left ventricular hypertrophy.  2. Left ventricular diastolic parameters are indeterminate.  3. Abnormal septal motion ? from pacing.  4. Global right ventricle has normal systolic function.The right  ventricular size is normal. No increase in right ventricular wall  thickness.  5. Left atrial size was normal.  6. Right atrial size was normal.  7. Moderate calcification of the mitral valve leaflet(s).  8. Moderate mitral annular calcification.  9. Moderate thickening of the  mitral valve leaflet(s).  10. The mitral valve is normal in structure. Trace mitral valve  regurgitation. No evidence of mitral stenosis.  11. The tricuspid valve is normal in structure. Tricuspid valve  regurgitation is mild.  12. The aortic valve was not well visualized. Aortic valve regurgitation  is not visualized. No evidence of aortic valve sclerosis or stenosis.  13. The pulmonic valve was grossly normal. Pulmonic valve regurgitation is  mild.  14. The inferior vena cava is normal in size with greater than 50%  respiratory variability, suggesting right atrial pressure of 3 mmHg.  15. The interatrial septum was not well visualized.     11/07/2014" LHC  Dist LAD lesion, 30% stenosed.  There is mild left ventricular systolic dysfunction.   Insertion of temporary transvenous pacemaker secondary to underlying complete heart block.  No significant coronary obstructive disease with evidence for mild mid systolic bridging of the mid LAD with narrowing up to 50% during systole; normal left circumflex coronary artery; calcification of the ostium of the RCA without significant stenosis in a very large dominant RCA vessel.  Mild global LV dysfunction with an ejection fraction at approximately 45%.  There is extensive mitral annular calcification  RECOMMENDATION: The patient will be transported to the CCU.  Plans will be for him to undergo permanent pacemaker insertion tomorrow.    Recent Labs: No results found for requested labs within last 8760 hours.  No results found for requested labs within last 8760 hours.   CrCl cannot be calculated (Patient's most recent lab result is older than the maximum 21 days allowed.).   Wt Readings from Last 3 Encounters:  11/27/19 163 lb 14.4 oz (74.3 kg)  05/21/19 183 lb (83 kg)  01/12/19 183 lb (83 kg)     Other studies reviewed: Additional studies/records reviewed today include: summarized above  ASSESSMENT AND PLAN:  1. Paroxysmal  Afib      CHA2DS2Vasc is 5, on Xarelto, appropriately dosed by last years labs     Tikosyn w/ stable QTc      5.2 % burden      Labs today  2. CRT-P     Intact function, no programming changes made  3. NICM     Recovered LVEF     OptiVol looks OK     No symptoms or exam findings of volume OL     He sees  Dr. Claiborne Billings next month  4. HTN     No changes today  Disposition: F/u with remotes as usual, EP in in clinic in 23mo, sooner if needed  Current medicines are reviewed at length with the patient today.  The patient did not have any concerns regarding medicines.  Venetia Night, PA-C 05/01/2020 7:45 PM     [place] [address] [phone] (office)  [phone] (fax)

## 2020-05-02 ENCOUNTER — Ambulatory Visit: Payer: Medicare Other | Admitting: Physician Assistant

## 2020-05-02 ENCOUNTER — Other Ambulatory Visit: Payer: Self-pay

## 2020-05-02 ENCOUNTER — Encounter: Payer: Self-pay | Admitting: Physician Assistant

## 2020-05-02 VITALS — BP 110/52 | HR 87 | Ht 67.0 in | Wt 173.6 lb

## 2020-05-02 DIAGNOSIS — Z5181 Encounter for therapeutic drug level monitoring: Secondary | ICD-10-CM

## 2020-05-02 DIAGNOSIS — I428 Other cardiomyopathies: Secondary | ICD-10-CM | POA: Diagnosis not present

## 2020-05-02 DIAGNOSIS — I480 Paroxysmal atrial fibrillation: Secondary | ICD-10-CM | POA: Diagnosis not present

## 2020-05-02 DIAGNOSIS — I509 Heart failure, unspecified: Secondary | ICD-10-CM

## 2020-05-02 DIAGNOSIS — Z950 Presence of cardiac pacemaker: Secondary | ICD-10-CM

## 2020-05-02 DIAGNOSIS — Z79899 Other long term (current) drug therapy: Secondary | ICD-10-CM

## 2020-05-02 DIAGNOSIS — I5022 Chronic systolic (congestive) heart failure: Secondary | ICD-10-CM | POA: Diagnosis not present

## 2020-05-02 LAB — CUP PACEART INCLINIC DEVICE CHECK
Battery Remaining Longevity: 15 mo
Battery Voltage: 2.8 V
Brady Statistic RA Percent Paced: 0.1 %
Brady Statistic RV Percent Paced: 99.22 %
Date Time Interrogation Session: 20220325165236
Implantable Lead Implant Date: 20160930
Implantable Lead Implant Date: 20160930
Implantable Lead Implant Date: 20160930
Implantable Lead Location: 753858
Implantable Lead Location: 753859
Implantable Lead Location: 753860
Implantable Pulse Generator Implant Date: 20160930
Lead Channel Impedance Value: 362.5 Ohm
Lead Channel Impedance Value: 675 Ohm
Lead Channel Impedance Value: 675 Ohm
Lead Channel Pacing Threshold Amplitude: 0.75 V
Lead Channel Pacing Threshold Amplitude: 0.75 V
Lead Channel Pacing Threshold Amplitude: 0.875 V
Lead Channel Pacing Threshold Amplitude: 1.5 V
Lead Channel Pacing Threshold Amplitude: 1.5 V
Lead Channel Pacing Threshold Pulse Width: 0.5 ms
Lead Channel Pacing Threshold Pulse Width: 0.5 ms
Lead Channel Pacing Threshold Pulse Width: 0.5 ms
Lead Channel Pacing Threshold Pulse Width: 0.8 ms
Lead Channel Pacing Threshold Pulse Width: 0.8 ms
Lead Channel Sensing Intrinsic Amplitude: 3.5 mV
Lead Channel Sensing Intrinsic Amplitude: 8.4 mV
Lead Channel Setting Pacing Amplitude: 2 V
Lead Channel Setting Pacing Amplitude: 2 V
Lead Channel Setting Pacing Amplitude: 2.75 V
Lead Channel Setting Pacing Pulse Width: 0.5 ms
Lead Channel Setting Pacing Pulse Width: 0.8 ms
Lead Channel Setting Sensing Sensitivity: 5 mV
Pulse Gen Model: 3262
Pulse Gen Serial Number: 7798436

## 2020-05-02 LAB — CBC
Hematocrit: 35.6 % — ABNORMAL LOW (ref 37.5–51.0)
Hemoglobin: 12 g/dL — ABNORMAL LOW (ref 13.0–17.7)
MCH: 31.9 pg (ref 26.6–33.0)
MCHC: 33.7 g/dL (ref 31.5–35.7)
MCV: 95 fL (ref 79–97)
Platelets: 308 10*3/uL (ref 150–450)
RBC: 3.76 x10E6/uL — ABNORMAL LOW (ref 4.14–5.80)
RDW: 12 % (ref 11.6–15.4)
WBC: 8 10*3/uL (ref 3.4–10.8)

## 2020-05-02 LAB — MAGNESIUM: Magnesium: 2.1 mg/dL (ref 1.6–2.3)

## 2020-05-02 LAB — BASIC METABOLIC PANEL
BUN/Creatinine Ratio: 25 — ABNORMAL HIGH (ref 10–24)
BUN: 24 mg/dL (ref 8–27)
CO2: 24 mmol/L (ref 20–29)
Calcium: 8.9 mg/dL (ref 8.6–10.2)
Chloride: 97 mmol/L (ref 96–106)
Creatinine, Ser: 0.95 mg/dL (ref 0.76–1.27)
Glucose: 139 mg/dL — ABNORMAL HIGH (ref 65–99)
Potassium: 4.4 mmol/L (ref 3.5–5.2)
Sodium: 135 mmol/L (ref 134–144)
eGFR: 78 mL/min/{1.73_m2} (ref >59)

## 2020-05-02 MED ORDER — METOPROLOL SUCCINATE ER 25 MG PO TB24: 25.0000 mg | ORAL_TABLET | Freq: Every day | ORAL | 11 refills | Status: AC

## 2020-05-02 MED ORDER — RIVAROXABAN 20 MG PO TABS: 20.0000 mg | ORAL_TABLET | Freq: Every day | ORAL | 2 refills | Status: AC

## 2020-05-02 MED ORDER — DOFETILIDE 250 MCG PO CAPS: 250.0000 ug | ORAL_CAPSULE | Freq: Two times a day (BID) | ORAL | 2 refills | Status: AC

## 2020-05-02 NOTE — Patient Instructions (Signed)
Medication Instructions:   Your physician recommends that you continue on your current medications as directed. Please refer to the Current Medication list given to you today.  *If you need a refill on your cardiac medications before your next appointment, please call your pharmacy*   Lab Work: BMET [place]    If you have labs (blood work) drawn today and your tests are completely normal, you will receive your results only by: Marland Kitchen MyChart Message (if you have MyChart) OR . A paper copy in the mail If you have any lab test that is abnormal or we need to change your treatment, we will call you to review the results.   Testing/Procedures: NONE ORDERED  TODAY    Follow-Up: At [facility], you and your health needs are our priority.  As part of our continuing mission to provide you with exceptional heart care, we have created designated Provider Care Teams.  These Care Teams include your primary Cardiologist (physician) and Advanced Practice Providers (APPs -  Physician Assistants and Nurse Practitioners) who all work together to provide you with the care you need, when you need it.  We recommend signing up for the patient portal called "MyChart".  Sign up information is provided on this After Visit Summary.  MyChart is used to connect with patients for Virtual Visits (Telemedicine).  Patients are able to view lab/test results, encounter notes, upcoming appointments, etc.  Non-urgent messages can be sent to your provider as well.   To learn more about what you can do with MyChart, go to [email or web address].    Your next appointment:   6 month(s)  The format for your next appointment:   In Person  Provider:   Tommye Standard, PA-C   Other Instructions

## 2020-05-26 ENCOUNTER — Encounter: Payer: Self-pay | Admitting: Cardiovascular Disease

## 2020-05-26 ENCOUNTER — Ambulatory Visit (INDEPENDENT_AMBULATORY_CARE_PROVIDER_SITE_OTHER): Payer: Medicare Other

## 2020-05-26 ENCOUNTER — Other Ambulatory Visit: Payer: Self-pay

## 2020-05-26 ENCOUNTER — Ambulatory Visit: Payer: Medicare Other | Admitting: Cardiovascular Disease

## 2020-05-26 VITALS — BP 98/56 | HR 82 | Ht 67.0 in | Wt 173.0 lb

## 2020-05-26 DIAGNOSIS — I428 Other cardiomyopathies: Secondary | ICD-10-CM | POA: Diagnosis not present

## 2020-05-26 DIAGNOSIS — I5022 Chronic systolic (congestive) heart failure: Secondary | ICD-10-CM | POA: Diagnosis not present

## 2020-05-26 DIAGNOSIS — E785 Hyperlipidemia, unspecified: Secondary | ICD-10-CM

## 2020-05-26 DIAGNOSIS — I480 Paroxysmal atrial fibrillation: Secondary | ICD-10-CM

## 2020-05-26 DIAGNOSIS — I2510 Atherosclerotic heart disease of native coronary artery without angina pectoris: Secondary | ICD-10-CM | POA: Diagnosis not present

## 2020-05-26 DIAGNOSIS — Z7901 Long term (current) use of anticoagulants: Secondary | ICD-10-CM

## 2020-05-26 DIAGNOSIS — Z950 Presence of cardiac pacemaker: Secondary | ICD-10-CM

## 2020-05-26 NOTE — Patient Instructions (Signed)

## 2020-05-26 NOTE — Progress Notes (Signed)
Patient ID: Thomas Long, male   DOB: 1931-04-28, 87 y.o.   MRN: [id number]    HPI: Thomas Long is a 87 y.o. male who presents to the office today for a 16 month followup cardiology evaluation.  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  In 2011 cardiac catheterization revealed ejection fraction at 50% with mild coronary artery disease coronary calcification with segmental 20% narrowing in the proximal LAD and mid LAD with mild muscle bridging, calcification at the ostium of the right coronary artery with 20-30% narrowing in the proximal to mid RCA and 30% distal narrowing. He has a history of a TIA with possible seizure and has been on Aggrenox and Keppra and is currently followed by neurology. An echo Doppler study in April 2012 showed an ejection fraction of 50% with inferior hypokinesis, mild mitral annular calcification and mild TR, mild aortic sclerosis without stenosis. I had seen him on 05/30/2012 with complaints of increased fatigability as well as shortness of breath with less activity.  A two-year followup echo Doppler study on 07/06/2012 which showed mild LVH. Ejection fraction was 50-55% and again there was mild hypokinesis of the basal mid inferior myocardium and  grade 1 diastolic dysfunction. He had minimal pulmonary hypertension with PA pressure 32 mm, mitral annular calcification with trivial MR. His aortic valve was mildly sclerotic without stenosis.  When I saw on 08/01/2012 he had orthostatic symptoms and was hypotensive. At that time, I weaned slowly and ultimately discontinued his Bystolic. He felt improved off this therapy. He has been without exertional chest pain.  He does admit to some aching nonexertional chest pain, particularly when he lies down, which lasts seconds to minutes. He does note some shortness of breath particularly with activity.  He denies any recent seizure activity.  He has had some difficulty with early memory loss and has been  started on Namenda 10 mg twice a day by neurology.  He continues to take atorvastatin 10 mg for hyperlipidemia.  He is unaware of palpitations.   He underwent a follow-up echo Doppler study on 08/29/2014.  This showed normal LV cavity size, but his LV function has slightly declined and was now 40-45% without segmental wall motion abnormality.  There also was felt to be mild the reduced RV systolic function.  He has had issues with low blood pressure.  He  saw Karrie Doffing, PA in Dr. Stefani Dama office.  He has not had any seizure activity but continues to be on Keppra.  He does have some short-term memory issues, which seem to be exacerbated by being overtired.  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  He no longer does visitation at the hospital due to his inability to walk distances without developing  shortness of breath.  He denies any chest pain.  He denies any awareness of arrhythmia.  He denies any bleeding.    He was admitted to [facility] hospital on 11/07/2014 with symptomatic complete heart block.  Upon presenting to the emergency room, his heart rate was in the 30s.  He had experienced several episodes of recurrent chest pain.  He was brought semi-urgently to the cardiac catheterization laboratory in cardiac catheterization was performed by me without difficulty.  This revealed no significant obstructive CAD but there was mild mid systolic bridging in the mid LAD with narrowing up to 50% during systole.  He had a normal left circumflex coronary artery and  there was calcification of the ostium of the RCA without significant stenosis and a very large dominant RCA vessel.  He had mild global LV dysfunction with an ejection fraction at 45%.  There was extensive mitral annular calcification.  He underwent insertion of a temporary transvenous pacemaker for his underlying complete heart block.  The following day he underwent permanent pacemaker insertion by Dr.  Rayann Heman and had a [employer] biventricular pacemaker implantation without difficulty.  Unfortunately, on October 2 1 back to the EP lab due to right atrial lead dislodgment and a new right atrial lead was placed and it was repositioning of a previously implanted right ventricular lead.  He developed a right groin pseudoaneurysm and underwent repair by Dr. Sherren Mocha Early on 11/15/2014.  He saw Dr. Rayann Heman back in the office on 01/07/2015.  Interrogation of his device revealed paroxysmal atrial fibrillation.  As result, his Aggrenox was discontinued and he was started on Xarelto 20 mg daily.  A follow-up echo Doppler study now showed normalization of LV function with an ejection fraction of 55-60%.  There was grade 2 diastolic dysfunction.  There was mild mitral regurgitation and mild dilatation of his left atrium.  When seen in March 2018 he was doing well and was also being followed by Dr. Rayann Heman without recurrent atrial fibrillation.  He was started on corlander 5 mg.  he had occasional low blood pressure.  He had normal pacemaker function.  He has continued to be on Xarelto for his atrial fibrillation and prior stroke.  He was evaluated in the emergency room on March 24, 2017 . He was recently seen by Almyra Deforest, PAC.  He had more pronounced weakness.  He denied chest pain.  He underwent a follow-up echo Doppler study on April 15, 2017 which revealed an EF of 50-55%.  There was grade 1 diastolic dysfunction.  Aortic root size was minimally increased.  The ascending aorta measured 40 mm.  There was mitral annular calcification with mild MR.  Abnormal septal function due to pacemaker induced dyssynergy.   I saw him in March 2019.  He subsequently developed recurrent atrial fibrillation and was hospitalized for Tikosyn load in July 2019. Subsequently, he has not been demonstrated to have any recurrent episodes of atrial arrhythmia.  He continues to be followed by Dr. Rayann Heman and evaluation in showed normal pacemaker  function. His AF burden is less than 1% on Tikosyn.  He continued to be on Xarelto with a chads2vasc score of 5.  His LV function has improved with CRT.  He has had issues with low blood pressure, walks with a walker and had fallen 2 times t secondary to tripping.  He is followed by neurology denied any recurrent seizures. He does have intermittent issues with short-term memory.   I  evaluated him in a telemedicine visit in May 2020.  At that time he was stable without chest pain.  He was unaware of any recurrent episodes of PAF.  I recommended a follow-up echo Doppler study be done prior to his next office visit.  Since I saw him, he was evaluated by EP and saw Barrington Ellison, PA-C.  He had normal pacemaker function.  AF burden was less than 1% on Tikosyn.  He continued to be on Xarelto with a CHA2DS2-VASc score of 5.  He underwent his follow-up echo Doppler study on December 29, 2018 which revealed normal LV function with EF 55 to 60%, mild LVH, and mitral annular calcification.  I last saw him  on January 12, 2019 in the office with his daughter. He denied recurrent chest pain.  He continued to walk with a walker.  He was unaware of recurrent atrial fibrillation.  He denied any lightheadedness or dizziness.  He does have some confusion at night and oftentimes may take a nap in the afternoon.    Since I last saw him, he has remained fairly stable.  His daughter will soon be retiring from teaching math at [employer].  She lives at home with her parents and has been playing a big part in her dad's care.  Has had advancing dementia.  Palliative care has recently become involved in his management.  He denies chest pain.  He was recently evaluated by Tommye Standard of EP.  He is felt to have normal device function.  Currently, he is unaware of recurrent AF.  Recent interrogation showed a 5.2% AF burden.  He continues to be on Tikosyn with a stable QTc interval.  LV function has remained stable and  recent OptiVol suggested euvolemia.  Estimated battery longevity is 1.3 years.  Past Medical History:  Diagnosis Date  . CAD (coronary artery disease)    a. LHC 10/2014: nonobstructive disease, 30% LAD with bridging up to 50%.  . CHF (congestive heart failure) ([place]) 2016   "this was the reason for his pacemaker" (08/23/2017)  . Chronic pain   . Complete heart block ([place]) 11/07/2014   a. s/p [facility] Quadra Allure MP RF model [id number] (serial number [id number]) biventricular pacemaker 10/2014.  Marland Kitchen Dementia ([place])    "memory lapses q now and then; dx'd as delayed memory" (08/23/2017)  . Depression   . Facial cellulitis 02/19/2009   "related to [facility] & having skin cancer zapped; took him off the Embrel"  . History of hiatal hernia 1980s  . Hyperlipidemia   . Hypertension   . NICM (nonischemic cardiomyopathy) ([place])    a. 10/2014: EF 45%. (Prev 40-45% by echo 08/2014).  . Orthostatic hypotension   . Presence of permanent cardiac pacemaker   . Rheumatoid arthritis ([place])    "hands mainly; knees"  (08/23/2017)  . Seizures ([place]) 2008   "vs stroke; never determined which it was; on sz RX since"  (08/23/2017)  . Sleep apnea 2008   "gone since losing weight" (08/23/2017)  . Stroke [facility]) 2008   "vs seizure; never determined which it was; on sz RX since"   (08/23/2017)  . TIA (transient ischemic attack) 2008   "vs seizure; never determined which it was; on sz RX since"   (08/23/2017)    Past Surgical History:  Procedure Laterality Date  . APPENDECTOMY  ~ 1988  . CARDIAC CATHETERIZATION  04/01/09   which showed low normal EF at 50% with question of underlying borderline area of minimal inferoapical hypercontractility. He had mild coronary obstructive disease with coronary calcification and segmental 20% narrowing in the LAD proximally, 20% in the mid LAD with muscle bridging. There was calcification at the ostium of the RCA, and 20 to 30%narrowing of the proximal to mid RCA with 30% distal n  . CARDIAC  CATHETERIZATION N/A 11/07/2014   Procedure: Left Heart Cath and Coronary Angiography/ temp wire;  Surgeon: [name], MD;  Location: [facility];  Service: Cardiovascular;  Laterality: N/A;  . CARDIOVERSION N/A 07/19/2017   Procedure: CARDIOVERSION;  Surgeon: Acie Fredrickson Wonda Cheng, MD;  Location: [facility];  Service: Cardiovascular;  Laterality: N/A;  . CATARACT EXTRACTION W/ INTRAOCULAR LENS  IMPLANT,  BILATERAL Bilateral early 2000's  . CHOLECYSTECTOMY OPEN  1980's  . EP IMPLANTABLE DEVICE N/A 11/08/2014   a. STJ CRTP implanted by Dr Rayann Heman  . EP IMPLANTABLE DEVICE N/A 11/10/2014   RA lead revision Dr Rayann Heman  . EXPLORATORY LAPAROTOMY  1990's   "put intestines back in & added mesh"  . FALSE ANEURYSM REPAIR Right 11/15/2014   Procedure: REPAIR OF RIGHT FEMORAL FALSE ANEURYSM ;  Surgeon: Rosetta Posner, MD;  Location: [facility];  Service: Vascular;  Laterality: Right;  . HERNIA REPAIR  1990s  . KNEE CARTILAGE SURGERY Left 1990's   "opened it up"  . TONSILLECTOMY      No Known Allergies  Current Outpatient Medications  Medication Sig Dispense Refill  . acetaminophen (TYLENOL) 500 MG tablet Take 500 mg by mouth every 6 (six) hours as needed for moderate pain or headache.    Marland Kitchen atorvastatin (LIPITOR) 10 MG tablet TAKE 1 TABLET DAILY 90 tablet 2  . cetirizine (ZYRTEC) 10 MG tablet Take 10 mg by mouth daily.    Marland Kitchen dofetilide (TIKOSYN) 250 MCG capsule Take 1 capsule (250 mcg total) by mouth 2 (two) times daily. 180 capsule 2  . latanoprost (XALATAN) 0.005 % ophthalmic solution Place 1 drop into both eyes at bedtime.   3  . levETIRAcetam (KEPPRA XR) 500 MG 24 hr tablet TAKE 1 TABLET BY MOUTH EVERY DAY 90 tablet 3  . memantine (NAMENDA) 10 MG tablet TAKE 1 TABLET BY MOUTH TWICE A DAY 180 tablet 3  . metoprolol succinate (TOPROL-XL) 25 MG 24 hr tablet Take 1 tablet (25 mg total) by mouth daily. 30 tablet 11  . Multiple Vitamins-Minerals (CENTRUM SILVER ADULT 50+ PO) Take 1 capsule by mouth daily.     . rivaroxaban (XARELTO) 20 MG TABS tablet Take 1 tablet (20 mg total) by mouth daily with supper. 90 tablet 2   No current facility-administered medications for this visit.    Socially he is married has 5 children 6 grandchildren. He typically goes to bed approximately 8:30 at night and wakes up at approximately 4 AM in the morning. In the early morning he spends time in prayer and then typically attends 7 AM Mass. Since I last saw him he is retired from his [employer].  ROS General: Negative; No fevers, chills, or night sweats;  HEENT: Positive for visual changes in his right eye due to glaucoma; no change in hearing, sinus congestion, difficulty swallowing Pulmonary: Negative; No cough, wheezing, shortness of breath, hemoptysis Cardiovascular: See history of present illness GI: Negative; No nausea, vomiting, diarrhea, or abdominal pain GU: Negative; No dysuria, hematuria, or difficulty voiding Musculoskeletal: Negative; no myalgias, joint pain, or weakness Hematologic/Oncology: Negative; no easy bruising, bleeding Endocrine: Negative; no heat/cold intolerance; no diabetes Neuro: No recent seizures.;  Some confusion at night, some short-term memory loss Skin: Negative; No rashes or skin lesions Psychiatric: Negative; No behavioral problems, depression Sleep: Negative; No snoring, daytime sleepiness, hypersomnolence, bruxism, restless legs, hypnogognic hallucinations, no cataplexy Other comprehensive 14 point system review is negative.   PE BP (!) 98/56 (BP Location: Left Arm, Patient Position: Sitting, Cuff Size: Normal)   Pulse 82   Ht 5' 7" (1.702 m)   Wt 173 lb (78.5 kg)   BMI 27.10 kg/m      Blood pressure remained stable and typically is low.  Repeat by me was 98/62  Wt Readings from Last 3 Encounters:  05/26/20 173 lb (78.5 kg)  05/02/20 173 lb 9.6 oz (78.7 kg)  11/27/19 163 lb 14.4 oz (74.3 kg)   General: Alert, oriented, no distress.  Appears more frail. Skin:  normal turgor, no rashes, warm and dry HEENT: Normocephalic, atraumatic. Pupils equal round and reactive to light; sclera anicteric; extraocular muscles intact;  Nose without nasal septal hypertrophy Mouth/Parynx benign; Mallinpatti scale 2 Neck: No JVD, no carotid bruits; normal carotid upstroke Lungs: clear to ausculatation and percussion; no wheezing or rales Chest wall: without tenderness to palpitation Heart: PMI not displaced, RRR, s1 s2 normal, 1/6 systolic murmur, no diastolic murmur, no rubs, gallops, thrills, or heaves Abdomen: soft, nontender; no hepatosplenomehaly, BS+; abdominal aorta nontender and not dilated by palpation. Back: kyphotic; no CVA tenderness Pulses 2+ Musculoskeletal: full range of motion, normal strength, no joint deformities Extremities: no clubbing cyanosis or edema, Homan's sign negative  Neurologic: grossly nonfocal; Cranial nerves grossly wnl Psychologic: Normal mood and affect   ECG (independently read by me): Atrial sensed, ventricular paced at 82,biventricular pacemaker  December 2020 ECG (independently read by me): Atrial sensed ventricular paced rhythm.  PVC.    March 2018 ECG (independently read by me): Atrially sensed, ventricular paced rhythm at 94 bpm.  PR interval 198 ms.  September 2017 ECG (independently read by me): Atrial sensing and ventricular paced rhythm at 101 bpm.  January 2017 ECG (independently read by me): Paced rhythm at 93 bpm.  Possible underlying A. fib.  ECG (independently read by me): Sinus tachycardia at 10 7 bpm.  First degree AV block with a PR interval at 224 ms.  Mild RV conduction delay.  No ectopy  May 2016 ECG (independently read by me): Sinus rhythm at 98 bpm.  Occasional PVC.  No ST segment changes.  QTc interval 472 ms.  First-degree AV block with a PR interval at 230 ms.  March 2015 ECG (independently read by me): Sinus tachycardia 103 beats per minute with one PAC. First degree AV block. RV conduction  delay.  Prior 11/02/2012 ECG: Normal sinus rhythm at 94beats per minute with a rare PAC. Borderline first-degree block; PR 210 ms. nonspecific T changes.  LABS: BMP Latest Ref Rng & Units 05/02/2020 11/03/2018 01/09/2018  Glucose 65 - 99 mg/dL 139(H) 109(H) 108(H)  BUN 8 - 27 mg/dL _0 Creatinine 0.76 - 1.27 mg/dL 0.95 1.00 1.08  BUN/Creat Ratio 10 - 24 25(H) 18 19  Sodium 134 - 144 mmol/L 135 137 138  Potassium 3.5 - 5.2 mmol/L 4.4 4.4 4.7  Chloride 96 - 106 mmol/L 97 98 98  CO2 20 - 29 mmol/L _1 Calcium 8.6 - 10.2 mg/dL 8.9 9.2 9.2   Hepatic Function Latest Ref Rng & Units 04/11/2017 10/24/2014 04/25/2013  Total Protein 6.0 - 8.5 g/dL 6.4 6.1 6.8  Albumin 3.5 - 4.7 g/dL 3.7 3.7 4.1  AST 0 - 40 IU/L _2 ALT 0 - 44 IU/L _3 Alk Phosphatase 39 - 117 IU/L 78 49 51  Total Bilirubin 0.0 - 1.2 mg/dL 0.6 0.8 0.9   CBC Latest Ref Rng & Units 05/02/2020 11/03/2018 07/07/2017  WBC 3.4 - 10.8 x10E3/uL 8.0 7.3 8.2  Hemoglobin 13.0 - 17.7 g/dL 12.0(L) 12.9(L) 12.1(L)  Hematocrit 37.5 - 51.0 % 35.6(L) 37.7 35.2(L)  Platelets 150 - 450 x10E3/uL 308 308 323   Lab Results  Component Value Date   MCV 95 05/02/2020   MCV 94 11/03/2018   MCV 92 07/07/2017   Lab Results  Component Value Date   TSH 1.210 04/11/2017  No results found for: HGBA1C  Lipid Panel     Component Value Date/Time   CHOL 127 10/24/2014 0814   TRIG 113 10/24/2014 0814   HDL 42 10/24/2014 0814   CHOLHDL 3.0 10/24/2014 0814   VLDL 23 10/24/2014 0814   LDLCALC 62 10/24/2014 0814   RADIOLOGY: No results found.  IMPRESSION:  1. NICM (nonischemic cardiomyopathy) (HCC)   2. Paroxysmal atrial fibrillation (HCC)   3. Cardiac pacemaker   4. CAD in native artery   5. Hyperlipidemia with target LDL less than 70   6. Anticoagulation adequate    ASSESSMENT AND PLAN: Thomas Long is an 87 year old white male has a history of documented mild CAD and previously was on beta blocker therapy.  Beta blocker had  been discontinued due to orthostatic hypotension and marked fatigability.  In 2016, he developed symptomatic complete heart block with heart rates in the 30s in the setting also of the mild recurrent chest pain.  Cardiac catheterization did not demonstrate significant obstructive disease and his LV function was reduced initially at 45%.  He underwent insertion of a biventricular pacemaker and several days later required lead revision due to atrial lead dislodgment.  His hospital course was complicated by the development of a pseudoaneurysm and he underwent successful repair by Dr. Donnetta Hutching.  Subsequently had developed PAF which has been well controlled since initiation of Tikosyn and he continues to be on low-dose metoprolol succinate 25 mg daily.  He is biventricular pacing greater than 99% of the time on his device checks in a consistent fashion.  Estimated longevity of his battery is now 1.3 years and he will continue to undergo remote 67-monthinterval device checks in 6 months EP office evaluation.  He continues to be on Xarelto without bleeding.  His blood pressure tends to be low but this is stable at 98/62 on repeat by me.  If there is further blood pressure reduction it may be possible to reduce Toprol-XL to 12.5 mg daily.  However I will not do this presently.  He continues to be on low-dose atorvastatin 10 mg.  He is now being followed by palliative care.  I will see him in 1 year for follow-up evaluation.   TTroy Sine MD, FAmbulatory Surgical Center Of [facility] Point 05/27/2020 6:13 PM

## 2020-05-27 ENCOUNTER — Encounter: Payer: Self-pay | Admitting: Cardiovascular Disease

## 2020-05-30 NOTE — Progress Notes (Signed)
EPIC Encounter for ICM Monitoring  Patient Name: Thomas Long is a 87 y.o. male Date: 05/30/2020 Primary Care Physican: Lajean Manes, MD Primary [place] Electrophysiologist: Allred Bi-V Pacing:>99%  AT/AF Burden:0%(taking Xarelto)   Transmission reviewed.  CorVue thoracic impedancesuggesting normal fluid levels.  No diuretic.  Recommendations:No changes.  Follow-up plan: ICM clinic phone appointment on6/13/2022.91 day device clinic remote transmission6/09/2020.   EP/Cardiology Office Visits: Recall9/21/2022 with Dillon Bjork, PA.  Recall 4/13/2023with Dr.Kelly.   Copy of ICM check sent to Dr.Allred.  3 month ICM trend: 05/26/2020.    1 Year ICM trend:       Rosalene Billings, RN 05/30/2020 10:43 AM

## 2020-06-01 ENCOUNTER — Encounter: Payer: Self-pay | Admitting: Adult Health

## 2020-06-03 ENCOUNTER — Telehealth: Payer: Medicare Other | Admitting: Adult Health

## 2020-06-04 ENCOUNTER — Other Ambulatory Visit: Payer: Medicare Other | Admitting: Nurse Practitioner

## 2020-06-04 ENCOUNTER — Other Ambulatory Visit: Payer: Self-pay

## 2020-06-04 DIAGNOSIS — F0391 Unspecified dementia with behavioral disturbance: Secondary | ICD-10-CM

## 2020-06-04 DIAGNOSIS — F03911 Unspecified dementia, unspecified severity, with agitation: Secondary | ICD-10-CM

## 2020-06-04 DIAGNOSIS — Z515 Encounter for palliative care: Secondary | ICD-10-CM

## 2020-06-04 NOTE — Progress Notes (Signed)
[facility] Consult Note Telephone: [phone]  Fax: [phone]    Date of encounter: 06/04/20 PATIENT NAME: Thomas Long [address] [place] [address]   [phone] (home)  DOB: 1931/12/04 MRN: [id number]   PRIMARY CARE PROVIDER:    Lajean Manes, MD,  [place] [place] [address] [phone]  REFERRING PROVIDER:   Lajean Manes, MD [address] [place],  [place] [address] [phone]  RESPONSIBLE PARTY:    Contact Information    Name Relation Home Work Mobile   Thomas Long,Thomas Long Spouse [place] Daughter   [phone]   [place] Daughter   [phone]      I met face to face with patient and family in home. Palliative Care was asked to follow this patient by consultation request of  Thomas Manes, MD to address advance care planning and complex medical decision making. This is a follow up visit.                                ASSESSMENT AND PLAN / RECOMMENDATIONS:   Advance Care Planning/Goals of Care: .  CODE STATUS: DNR  Goal of Care: Comfort while preserving function.  Directives: Signed DNR and MOST form present in home, Copy on [facility] Epic EMR  Symptom Management/Plan: Agitation: Family report stable symptoms in the last week.  Recommendation: Continue supportive care. Encourage participation in activities that are both stimulating and enjoyable to patient. Discussion on disease trajectory of dementia with family, as it is progressive and terminal and likely to eventually lead to dysphagia, weight loss and immobility. Emotional and supportive care provided.  Follow up Palliative Care Visit: Palliative care will continue to follow for complex medical decision making, advance care planning, and clarification of goals. Return 5 weeks or prn.  PPS: 40%  HOSPICE ELIGIBILITY/DIAGNOSIS: TBD  CHIEF COMPLAIN: Agitation  History obtained  from review of Epic EMR and discussion with patient's family.  HISTORY OF PRESENT ILLNESS:Thomas Long a 87 y.o.year old malewith multiple medical problems including Dementia(FAST 7a), seizures, CAD, CHF, chronic pain, complete heart block s/p pace maker, dementia, depression, HTN, non- ischemic cardiomyopathy, Rheumatoid arthritis, sleep apnea, hx of CVA.  Family report patient with agitation, started a month ago, it is intermetent and progressive. Condition is related to advance dementia. Family report good appetite, no report of constipation, no report of falls. No report of fever, chills, SOB, or evidence of uncontrolled. Ten systems reviewed and are negative for acute change, except as noted in the HPI.   I reviewed available labs, medications, imaging, studies and related documents from the EMR.  Records reviewed and summarized above.   Physical Exam: Current and past weights: Constitutional: NAD General: frail appearing, thin/WNWD/obese  EYES: anicteric sclera, lids intact, no discharge  ENMT: intact hearing, oral mucous membranes moist, dentition intact CV: S1S2, RRR, no LE edema Pulmonary: LCTA, no increased work of breathing, no cough, room air Abdomen: intake 100%, normo-active BS + 4 quadrants, soft and non tender, no ascites GU: deferred MSK: no sarcopenia, moves all extremities, ambulatory Skin: warm and dry, no rashes or wounds on visible skin Neuro:  no generalized weakness,  no cognitive impairment Psych: non-anxious affect, A and O x 3 Hem/lymph/immuno: no widespread bruising  Past Medical History:  Diagnosis Date  . CAD (coronary artery disease)    a. LHC 10/2014: nonobstructive disease,  30% LAD with bridging up to 50%.  . CHF (congestive heart failure) ([place]) 2016   "this was the reason for his pacemaker" (08/23/2017)  . Chronic pain   . Complete heart block ([place]) 11/07/2014   a. s/p [facility] Quadra Allure MP RF model [phone] (serial number [id number])  biventricular pacemaker 10/2014.  Marland Kitchen Dementia ([place])    "memory lapses q now and then; dx'd as delayed memory" (08/23/2017)  . Depression   . Facial cellulitis 02/19/2009   "related to [facility] & having skin cancer zapped; took him off the Embrel"  . History of hiatal hernia 1980s  . Hyperlipidemia   . Hypertension   . NICM (nonischemic cardiomyopathy) ([facility])    a. 10/2014: EF 45%. (Prev 40-45% by echo 08/2014).  . Orthostatic hypotension   . Presence of permanent cardiac pacemaker   . Rheumatoid arthritis ([facility])    "hands mainly; knees"  (08/23/2017)  . Seizures ([place]) 2008   "vs stroke; never determined which it was; on sz RX since"  (08/23/2017)  . Sleep apnea 2008   "gone since losing weight" (08/23/2017)  . Stroke [facility]) 2008   "vs seizure; never determined which it was; on sz RX since"   (08/23/2017)  . TIA (transient ischemic attack) 2008   "vs seizure; never determined which it was; on sz RX since"   (08/23/2017)    Current Outpatient Medications on File Prior to Visit  Medication Sig Dispense Refill  . acetaminophen (TYLENOL) 500 MG tablet Take 500 mg by mouth every 6 (six) hours as needed for moderate pain or headache.    Marland Kitchen atorvastatin (LIPITOR) 10 MG tablet TAKE 1 TABLET DAILY 90 tablet 2  . cetirizine (ZYRTEC) 10 MG tablet Take 10 mg by mouth daily.    Marland Kitchen dofetilide (TIKOSYN) 250 MCG capsule Take 1 capsule (250 mcg total) by mouth 2 (two) times daily. 180 capsule 2  . latanoprost (XALATAN) 0.005 % ophthalmic solution Place 1 drop into both eyes at bedtime.   3  . levETIRAcetam (KEPPRA XR) 500 MG 24 hr tablet TAKE 1 TABLET BY MOUTH EVERY DAY 90 tablet 3  . memantine (NAMENDA) 10 MG tablet TAKE 1 TABLET BY MOUTH TWICE A DAY 180 tablet 3  . metoprolol succinate (TOPROL-XL) 25 MG 24 hr tablet Take 1 tablet (25 mg total) by mouth daily. 30 tablet 11  . Multiple Vitamins-Minerals (CENTRUM SILVER ADULT 50+ PO) Take 1 capsule by mouth daily.    . rivaroxaban (XARELTO) 20 MG TABS tablet Take 1  tablet (20 mg total) by mouth daily with supper. 90 tablet 2   No current facility-administered medications on file prior to visit.    Thank you for the opportunity to participate in the care of Mr. Ceesay.  The palliative care team will continue to follow. Please call our office at [phone] if we can be of additional assistance.   Alba Destine, NP DNP,AGPCNP-BC  COVID-19 PATIENT SCREENING TOOL Asked and negative response unless otherwise noted:   Have you had symptoms of covid, tested positive or been in contact with someone with symptoms/positive test in the past 5-10 days?

## 2020-07-11 ENCOUNTER — Other Ambulatory Visit: Payer: Medicare Other | Admitting: Nurse Practitioner

## 2020-07-11 ENCOUNTER — Other Ambulatory Visit: Payer: Self-pay

## 2020-07-11 VITALS — BP 110/52 | HR 72 | Ht 68.0 in | Wt 173.2 lb

## 2020-07-11 DIAGNOSIS — R3981 Functional urinary incontinence: Secondary | ICD-10-CM

## 2020-07-11 DIAGNOSIS — F0280 Dementia in other diseases classified elsewhere without behavioral disturbance: Secondary | ICD-10-CM

## 2020-07-11 DIAGNOSIS — Z515 Encounter for palliative care: Secondary | ICD-10-CM

## 2020-07-11 NOTE — Progress Notes (Signed)
[facility] Consult Note Telephone: [phone]  Fax: [phone]    Date of encounter: 07/11/20 PATIENT NAME: Thomas Long [address] [place] [address]   [phone] (home)  DOB: 05/30/1931 MRN: [id number]  PRIMARY CARE PROVIDER:    Lajean Manes, MD,  [place]. [address] [place] [address] [place] [address] [phone]  REFERRING PROVIDER:   Lajean Manes, MD [address] [place],  [place] [address] [phone]  RESPONSIBLE PARTY:    Contact Information    Name Relation Home Work Mobile   Wooding,Eileen Spouse [place] Daughter   [phone]   [place] Daughter   [phone]     I met face to face with patient in home. Daughter Thomas Long present during visit. Palliative Care was asked to follow this patient by consultation request of  Lajean Manes, MD to address advance care planning and complex medical decision making. This is a follow up visit.                                  ASSESSMENT AND PLAN / RECOMMENDATIONS:   Advance Care Planning/Goals of Care:  CODE STATUS: DNR Goal of care: Goal of care is comfort while preserving function. Directives: Signed DNR and MOST forms present in home.  Symptom Management/Plan: Urinary incontinence: no evidence of infectious origin, urinary incontinence likely related to progression of dementia. Encouraged supportive care. Consider use of incontinent pads /underwear. May consider use of bedside commode in bedroom to promote ease of use and for safety. Encouraged use of grip socks when not wearing shoes to prevent falls. CAD: complete heart blocks/p pace maker. No report of chest pain. Patient has up coming plan for pace maker battery replacement. Family want patient to have the battery replacement procedure as they where told it will only require local anesthesia. Continue follow up with cardiology.  Dementia without behavioral  disturbance: condition is stable. Continue supportive care. Continue Namenda. Discussion on disease trajectory of dementia with family, as it is progressive and terminal and likely to eventually lead to dysphagia, weight loss and immobility. Emotional and supportive care provided. Provided general support and encouragement. Questions and concerns were addressed. Patient family was encouraged to call with questions and/or concerns.  Follow up Palliative Care Visit: Palliative care will continue to follow for complex medical decision making, advance care planning, and clarification of goals. Return in about 4 weeks or prn.  PPS: 40%  HOSPICE ELIGIBILITY/DIAGNOSIS: TBD  CHIEF COMPLAIN: urinary incontinence  History obtained from review of Epic EMR and discussion with patient and his family.    HISTORY OF PRESENT ILLNESS:Thomas F Rohanis a 87 y.o.year old malewith multiple medical problems including Dementia(FAST 7a), seizures disorder, CAD, CHF (55-60% 2020), chronic pain, complete heart blocks/p pace maker, depression. Family report patient with increased urinary incontinence. Report patient with difficulty manipulating the process of urinationation. Of note patient has been continent of urine and bladder, he is able to notify his family of his needs. No report of urinary urgency, frequency, dysuria or malodorous urine. No report of abdominal pain, or hematuria, no report of fever or chills.  Limited ROS from patient due to poor cognition related to advance dementia.  Reviewed lab from 07/02/2020 Na 136, K 4.6, Cr 0.93, GFR 79, wbc 7.7, hgb 12, hct 35.2 A1c 6 Recent pacemaker remote check report from 04/18/2019 showed normal device function. Battery  status, leads stable.   I reviewed available labs, medications, imaging, studies and related documents from the EMR.  Records reviewed and summarized above.   Physical Exam: General: frail appearing, sitting in chair in NAD  CV: no LE  edema Pulmonary: LCTA, no increased work of breathing, no cough, room air Abdomen: soft and non tender, no ascites GU: no bladder distension MSK: sarcopenia, moves all extremities, ambulatory Skin: warm and dry, no rashes or wounds on visible skin, left heel calous resolved Neuro:  no generalized weakness,  no cognitive impairment Psych: non-anxious affect, awake and alert  Past Medical History:  Diagnosis Date  . CAD (coronary artery disease)    a. LHC 10/2014: nonobstructive disease, 30% LAD with bridging up to 50%.  . CHF (congestive heart failure) ([place]) 2016   "this was the reason for his pacemaker" (08/23/2017)  . Chronic pain   . Complete heart block ([place]) 11/07/2014   a. s/p [facility] Quadra Allure MP RF model [id number] (serial number [id number]) biventricular pacemaker 10/2014.  Marland Kitchen Dementia ([place])    "memory lapses q now and then; dx'd as delayed memory" (08/23/2017)  . Depression   . Facial cellulitis 02/19/2009   "related to [facility] & having skin cancer zapped; took him off the Embrel"  . History of hiatal hernia 1980s  . Hyperlipidemia   . Hypertension   . NICM (nonischemic cardiomyopathy) ([place])    a. 10/2014: EF 45%. (Prev 40-45% by echo 08/2014).  . Orthostatic hypotension   . Presence of permanent cardiac pacemaker   . Rheumatoid arthritis ([place])    "hands mainly; knees"  (08/23/2017)  . Seizures ([place]) 2008   "vs stroke; never determined which it was; on sz RX since"  (08/23/2017)  . Sleep apnea 2008   "gone since losing weight" (08/23/2017)  . Stroke [facility]) 2008   "vs seizure; never determined which it was; on sz RX since"   (08/23/2017)  . TIA (transient ischemic attack) 2008   "vs seizure; never determined which it was; on sz RX since"   (08/23/2017)   Current Outpatient Medications on File Prior to Visit  Medication Sig Dispense Refill  . acetaminophen (TYLENOL) 500 MG tablet Take 500 mg by mouth every 6 (six) hours as needed for moderate pain or headache.    Marland Kitchen  atorvastatin (LIPITOR) 10 MG tablet TAKE 1 TABLET DAILY 90 tablet 2  . cetirizine (ZYRTEC) 10 MG tablet Take 10 mg by mouth daily.    Marland Kitchen dofetilide (TIKOSYN) 250 MCG capsule Take 1 capsule (250 mcg total) by mouth 2 (two) times daily. 180 capsule 2  . latanoprost (XALATAN) 0.005 % ophthalmic solution Place 1 drop into both eyes at bedtime.   3  . levETIRAcetam (KEPPRA XR) 500 MG 24 hr tablet TAKE 1 TABLET BY MOUTH EVERY DAY 90 tablet 3  . memantine (NAMENDA) 10 MG tablet TAKE 1 TABLET BY MOUTH TWICE A DAY 180 tablet 3  . metoprolol succinate (TOPROL-XL) 25 MG 24 hr tablet Take 1 tablet (25 mg total) by mouth daily. 30 tablet 11  . Multiple Vitamins-Minerals (CENTRUM SILVER ADULT 50+ PO) Take 1 capsule by mouth daily.    . rivaroxaban (XARELTO) 20 MG TABS tablet Take 1 tablet (20 mg total) by mouth daily with supper. 90 tablet 2   No current facility-administered medications on file prior to visit.   Thank you for the opportunity to participate in the care of Mr. Seales.  The palliative care team will continue to follow. Please  call our office at [phone] if we can be of additional assistance.   Jari Favre, DNP, AGPCNP-BC  COVID-19 PATIENT SCREENING TOOL Asked and negative response unless otherwise noted:   Have you had symptoms of covid, tested positive or been in contact with someone with symptoms/positive test in the past 5-10 days?

## 2020-07-16 ENCOUNTER — Ambulatory Visit (INDEPENDENT_AMBULATORY_CARE_PROVIDER_SITE_OTHER): Payer: Medicare Other

## 2020-07-16 DIAGNOSIS — I428 Other cardiomyopathies: Secondary | ICD-10-CM

## 2020-07-16 LAB — CUP PACEART REMOTE DEVICE CHECK
Battery Remaining Longevity: 12 mo
Battery Remaining Percentage: 18 %
Battery Voltage: 2.77 V
Brady Statistic AP VP Percent: 1 %
Brady Statistic AP VS Percent: 1 %
Brady Statistic AS VP Percent: 99 %
Brady Statistic AS VS Percent: 1 %
Brady Statistic RA Percent Paced: 1 %
Date Time Interrogation Session: 20220608094600
Implantable Lead Implant Date: 20160930
Implantable Lead Implant Date: 20160930
Implantable Lead Implant Date: 20160930
Implantable Lead Location: 753858
Implantable Lead Location: 753859
Implantable Lead Location: 753860
Implantable Pulse Generator Implant Date: 20160930
Lead Channel Impedance Value: 360 Ohm
Lead Channel Impedance Value: 690 Ohm
Lead Channel Impedance Value: 710 Ohm
Lead Channel Pacing Threshold Amplitude: 0.75 V
Lead Channel Pacing Threshold Amplitude: 1 V
Lead Channel Pacing Threshold Amplitude: 1.5 V
Lead Channel Pacing Threshold Pulse Width: 0.5 ms
Lead Channel Pacing Threshold Pulse Width: 0.5 ms
Lead Channel Pacing Threshold Pulse Width: 0.8 ms
Lead Channel Sensing Intrinsic Amplitude: 12 mV
Lead Channel Sensing Intrinsic Amplitude: 2.9 mV
Lead Channel Setting Pacing Amplitude: 2 V
Lead Channel Setting Pacing Amplitude: 2 V
Lead Channel Setting Pacing Amplitude: 2.75 V
Lead Channel Setting Pacing Pulse Width: 0.5 ms
Lead Channel Setting Pacing Pulse Width: 0.8 ms
Lead Channel Setting Sensing Sensitivity: 5 mV
Pulse Gen Model: 3262
Pulse Gen Serial Number: 7798436

## 2020-07-21 ENCOUNTER — Ambulatory Visit (INDEPENDENT_AMBULATORY_CARE_PROVIDER_SITE_OTHER): Payer: Medicare Other

## 2020-07-21 DIAGNOSIS — Z950 Presence of cardiac pacemaker: Secondary | ICD-10-CM | POA: Diagnosis not present

## 2020-07-21 DIAGNOSIS — I5022 Chronic systolic (congestive) heart failure: Secondary | ICD-10-CM | POA: Diagnosis not present

## 2020-07-23 NOTE — Progress Notes (Signed)
EPIC Encounter for ICM Monitoring  Patient Name: Thomas Long is a 87 y.o. male Date: 07/23/2020 Primary Care Physican: Lajean Manes, MD Primary Cardiologist: Claiborne Billings Electrophysiologist: Allred Bi-V Pacing: >99%        AT/AF Burden: 0% (taking Xarelto)     Transmission reviewed.   CorVue thoracic impedance suggesting normal fluid levels.    No diuretic.       Recommendations:  No changes.   Follow-up plan: ICM clinic phone appointment on 08/25/2020.   91 day device clinic remote transmission 10/15/2020.     EP/Cardiology Office Visits:  09/16/2020 with Dillon Bjork, PA.  Recall 05/21/2021 with Dr. Claiborne Billings.     Copy of ICM check sent to Dr. Rayann Heman.    3 month ICM trend: 07/21/2020.    1 Year ICM trend:       Rosalene Billings, RN 07/23/2020 11:26 AM

## 2020-08-08 NOTE — Progress Notes (Signed)
Remote pacemaker transmission.   

## 2020-08-25 ENCOUNTER — Ambulatory Visit (INDEPENDENT_AMBULATORY_CARE_PROVIDER_SITE_OTHER): Payer: Medicare Other

## 2020-08-25 DIAGNOSIS — Z950 Presence of cardiac pacemaker: Secondary | ICD-10-CM

## 2020-08-25 DIAGNOSIS — I5022 Chronic systolic (congestive) heart failure: Secondary | ICD-10-CM | POA: Diagnosis not present

## 2020-08-26 ENCOUNTER — Other Ambulatory Visit: Payer: Medicare Other | Admitting: *Deleted

## 2020-08-26 ENCOUNTER — Other Ambulatory Visit: Payer: Medicare Other

## 2020-08-26 ENCOUNTER — Other Ambulatory Visit: Payer: Self-pay

## 2020-08-26 VITALS — BP 90/58 | HR 70 | Temp 97.6°F | Resp 16

## 2020-08-26 DIAGNOSIS — Z515 Encounter for palliative care: Secondary | ICD-10-CM

## 2020-08-26 NOTE — Progress Notes (Signed)
[facility] P[facility]AT[facility] RN NOTE  PATIENT NAME: Thomas Long DOB: 09/23/1931 MRN: [id number]  PRIMARY CARE PROVIDER: Stoneking, Hal, MD  RESPONSIBLE PARTY:  Acct ID - Guarantor Home Phone Work Phone Relationship Acct Type  [id number] - Espe,TIMOT* [phone]  Self P/F     [address], [place], [place] [address]   Covid-19 Pre-screening Negative  PLAN OF CARE and INTERVENTION:  ADVANCE CARE PLANNING/GOALS OF CARE: Goal is for patient to remain in the home. He has a DNR and a MOST form. PATIENT/CAREGIVER EDUCATION: Symptom management, dementia education, safe mobility DISEASE STATUS: Joint palliative care visit made with Monica Lonon, MSW. Met with patient, wife and daughter who lives in the home. Their son also lives in the home to help with patient's care. Patient is sitting up in his recliner awake and alert. He is confused with some expressive aphasia. Speech is mixed with clear and unintelligible speech. He often times does not recognize he is at home. He is less verbal They are starting to notice an increase in agitation and medication refusals. This worsens in the evenings. They will re-approach which does help.  He is ambulatory using his walker, but tires easily and family has to guide his walker. He is more unsteady in the mornings. Transport chair utilized outside of the home. He says his legs feel heavy. He is moving slower overall. He requires 1 person assistance with with all ADLs. He is now incontinent of both bowel and bladder and has been wearing Depends for the past 3 months. His appetite is variable. He usually eats 2 meals a day plus snacks. His meats are cut up. No difficulty swallowing. He takes his medications whole. They are working on pushing fluids. He is napping more during the day and sleeping more overall. He has a pacemaker. It was last checked in March. Daughter says battery life is 15 months and it will be rechecked in August. He has a stage 2 on the  inner aspect of his left heel, reddened area on left outer knee and right outer calf. Surrounding areas still blanch. Recommended using skin prep twice daily for heel. Next follow-up visit scheduled on August 31 at 11a.    CODE STATUS: DNR ADVANCED DIRECTIVES: Y MOST FORM: yes PPS: 40%   PHYSICAL EXAM:   VITALS: Today's Vitals   08/26/20 1117  BP: (!) 90/58  Pulse: 70  Resp: 16  Temp: 97.6 F (36.4 C)  TempSrc: Temporal  SpO2: 99%  PainSc: 0-No pain    LUNGS: clear to auscultation  CARDIAC: Cor RRR, pacemaker EXTREMITIES: No edema SKIN:  See above note   NEURO:  Alert and oriented to self, confused, expressive aphasia, increased generalized weakness, ambulatory w/walker and 1 person assist   (Duration of visit and documentation 75 minutes)    , RN BSN  

## 2020-08-26 NOTE — Progress Notes (Signed)
COMMUNITY PALLIATIVE CARE SW NOTE  PATIENT NAME: Thomas Long DOB: [birth year] MRN: [id number]  PRIMARY CARE PROVIDER: Lajean Manes, MD  RESPONSIBLE PARTY:  Acct ID - Guarantor Home Phone Work Phone Relationship Acct Type  [id number] Ottis [phone]  Self P/F     [place], [place], [place] [address]     PLAN OF CARE and INTERVENTIONS:             GOALS OF CARE/ ADVANCE CARE PLANNING:  Goal is to keep patient in his home. Patient is DNR.  SOCIAL/EMOTIONAL/SPIRITUAL ASSESSMENT/ INTERVENTIONS:  SW and RN-M. Nadara Mustard completed a follow-up visit with patient. He was present in his recliner. Patient is pleasantly confused, but calm and pleasant. His wife-[name] and daughter-[name] was present The family provided an update on patient status. Patient's son helped with ADL's. Patient ambulates with a walker that is guided by the family and his gait is unsteady. Patient continues to go to [place], but patient is transported in a wheelchair. Patient continues to have SOB with any activity.  Patient is mostly unsteady and that is a little better during the day. His verbalizations have decreased, as he is having difficulty finding words. When team addressed patient, he repeated statements or they were non sensical in nature. Patient has some red areas on his ankles, feet and heels that the family is treating with Vaseline. His daughter reports that patient whence when his feet are touched and suspect that he may have some neuropathy. Patient is having increased incontinent episodes and is using depends. The family encourages his fluid intake. His appetite is good. His taking his medications with no problems. The family notes weight loss and muscular wasting. Patient is scheduled to the cardiologist in August. SW assisted the family in locating a mattress cover that could protect the bed when patient urinates. Patient and his wife celebrating a anniversary today. Patient/family remains open to  palliative care support.  PATIENT/CAREGIVER EDUCATION/ COPING: Patient has a supportive family. They seem realistic and appears to be coping well.  PERSONAL EMERGENCY PLAN:  911 can be activated for emergencies. COMMUNITY RESOURCES COORDINATION/ HEALTH CARE NAVIGATION:  None. FINANCIAL/LEGAL CONCERNS/INTERVENTIONS:  None.     SOCIAL HX:  Social History   Tobacco Use   Smoking status: Never   Smokeless tobacco: Never  Substance Use Topics   Alcohol use: Not Currently    Alcohol/week: 0.0 standard drinks    CODE STATUS: DNR ADVANCED DIRECTIVES: Yes MOST FORM COMPLETE:  Yes HOSPICE EDUCATION PROVIDED:No  PPS: Patient is pleasantly confused. He ambulates with a walker, but his gait is unsteady.   Duration of visit and documentation: 60 minutes   Katheren Puller, LCSW

## 2020-08-27 NOTE — Progress Notes (Signed)
EPIC Encounter for ICM Monitoring  Patient Name: Thomas Long is a 87 y.o. male Date: 08/27/2020 Primary Care Physican: Lajean Manes, MD Primary Cardiologist: Claiborne Billings Electrophysiologist: Allred Bi-V Pacing: >99%        AT/AF Burden: <1% (taking Xarelto)  Battery Longevity: 2.7 months     Transmission reviewed. Pt in Palliative care.   CorVue thoracic impedance suggesting normal fluid levels.    No diuretic.       Recommendations:  No changes.   Follow-up plan: ICM clinic phone appointment on 09/29/2020.   91 day device clinic remote transmission 10/15/2020.     EP/Cardiology Office Visits:  09/16/2020 with Dillon Bjork, PA.  Recall 05/21/2021 with Dr. Claiborne Billings.     Copy of ICM check sent to Dr. Rayann Heman.   3 month ICM trend: 08/25/2020.    1 Year ICM trend:       Rosalene Billings, RN 08/27/2020 2:55 PM

## 2020-09-16 ENCOUNTER — Encounter: Payer: Medicare Other | Admitting: Physician Assistant

## 2020-10-06 ENCOUNTER — Encounter: Payer: Self-pay | Admitting: *Deleted

## 2020-10-06 ENCOUNTER — Encounter: Payer: Self-pay | Admitting: Adult Health

## 2020-10-06 ENCOUNTER — Ambulatory Visit (INDEPENDENT_AMBULATORY_CARE_PROVIDER_SITE_OTHER): Payer: Medicare Other | Admitting: Internal Medicine

## 2020-10-06 ENCOUNTER — Other Ambulatory Visit: Payer: Self-pay

## 2020-10-06 VITALS — BP 92/54 | HR 85 | Ht 68.0 in | Wt 164.4 lb

## 2020-10-06 DIAGNOSIS — I480 Paroxysmal atrial fibrillation: Secondary | ICD-10-CM

## 2020-10-06 DIAGNOSIS — I442 Atrioventricular block, complete: Secondary | ICD-10-CM

## 2020-10-06 DIAGNOSIS — I5022 Chronic systolic (congestive) heart failure: Secondary | ICD-10-CM | POA: Diagnosis not present

## 2020-10-06 NOTE — Patient Instructions (Addendum)
Medication Instructions:  Your physician recommends that you continue on your current medications as directed. Please refer to the Current Medication list given to you today.  Labwork: None ordered.  Testing/Procedures: Your physician has recommended that you have a pacemaker inserted. A pacemaker is a small device that is placed under the skin of your chest or abdomen to help control abnormal heart rhythms. This device uses electrical pulses to prompt the heart to beat at a normal rate. Pacemakers are used to treat heart rhythms that are too slow. Wire (leads) are attached to the pacemaker that goes into the chambers of you heart. This is done in the hospital and usually requires and overnight stay. Please see the instruction sheet given to you today for more information.   Remote monitoring is used to monitor your Pacemaker from home. This monitoring reduces the number of office visits required to check your device to one time per year. It allows [place] to keep an eye on the functioning of your device to ensure it is working properly. You are scheduled for a device check from home on 10/07/20. You may send your transmission at any time that day. If you have a wireless device, the transmission will be sent automatically. After your physician reviews your transmission, you will receive a postcard with your next transmission date.  Any Other Special Instructions Will Be Listed Below (If Applicable).  If you need a refill on your cardiac medications before your next appointment, please call your pharmacy.   [employer] (26th ed., pp. [id number]). [place], [place]: Elsevier."> Clinical Cardiac Pacing, Defibrillation and Resynchronization Therapy (5th ed., pp. 251-269). [place], [place]: Elsevier.">  Pacemaker Battery Change  A pacemaker battery usually lasts 5-15 years (6-7 years on average). A few times a year, you may be asked to visit your health care provider to have a full evaluation of your  pacemaker. When the battery is low, your pacemaker will be completely replaced. Most often, this procedure is simpler than the first surgery because the wires (leads) that connect the pacemaker to the heart are already in place. There are many things that affect how long a pacemaker battery will last, including: The age of the pacemaker. The number of leads you have(1, 2, or 3). The use or workload of the pacemaker. If the pacemaker is helping the heart more often, the battery will not last as long. Power (voltage) settings. Tell a health care provider about: Any allergies you have. All medicines you are taking, including vitamins, herbs, eye drops, creams, and over-the-counter medicines. Any problems you or family members have had with anesthetic medicines. Any blood disorders you have. Any surgeries you have had, especially any surgeries you have had since your last pacemaker was placed. Any medical conditions you have. Whether you are pregnant or may be pregnant. What are the risks? Generally, this is a safe procedure. However, problems may occur, including: Bleeding. Infection. Nerve damage. Allergic reaction to medicines. Damage to the leads that go to the heart. What happens before the procedure? Staying hydrated Follow instructions from your health care provider about hydration, which may include: Up to 2 hours before the procedure - you may continue to drink clear liquids, such as water, clear fruit juice, black coffee, and plain tea. Eating and drinking restrictions Follow instructions from your health care provider about eating and drinking restrictions, which may include: 8 hours before the procedure - stop eating heavy meals or foods, such as meat, fried foods, or fatty foods. 6 hours  before the procedure - stop eating light meals or foods, such as toast or cereal. 6 hours before the procedure - stop drinking milk or drinks that contain milk. 2 hours before the procedure -  stop drinking clear liquids. Medicines Ask your health care provider about: Changing or stopping your regular medicines. This is especially important if you are taking diabetes medicines or blood thinners. Taking medicines such as aspirin and ibuprofen. These medicines can thin your blood. Do not take these medicines unless your health care provider tells you to take them. Taking over-the-counter medicines, vitamins, herbs, and supplements. General instructions Ask your health care provider what steps will be taken to help prevent infection. These may include: Removing hair at the surgery site. Washing skin with a germ-killing soap. Receiving antibiotic medicine. Plan to have someone take you home from the hospital or clinic. If you will be going home right after the procedure, plan to have someone with you for 24 hours. What happens during the procedure? An IV will be inserted into one of your veins. You will be given one or more of the following: A medicine to help you relax (sedative). A medicine to numb the area where the pacemaker is located (local anesthetic). Your health care provider will make an incision to reopen the pocket holding the pacemaker. The old pacemaker will be disconnected from the leads. The leads will be tested. If needed, the leads will be replaced. If the leads are functioning properly, the new pacemaker will be connected to the existing leads. A heart monitor and a pacemaker programmer will be used to make sure that the newly implanted pacemaker is working properly. The incision site will be closed with stitches (sutures), adhesive strips, or skin glue. A bandage (dressing) will be placed over the pacemaker site. The procedure may vary among health care providers and hospitals. What happens after the procedure? Your blood pressure, heart rate, breathing rate, and blood oxygen level will be monitored until you leave the hospital or clinic. You may be given  antibiotics. Your health care provider will tell you when your pacemaker will need to be tested again, or when to return to the office for removal of the dressing and sutures. If you were given a sedative during the procedure, it can affect you for several hours. Do not drive or operate machinery until your health care provider says that it is safe. You will be given a pacemaker identification card. This card lists the implant date, device model, and manufacturer of your pacemaker. Summary A pacemaker battery usually lasts 5-15 years (6-7 years on average). When the battery is low, your pacemaker will need to be replaced. Most often, this procedure is simpler than the first surgery because the wires (leads) that connect the pacemaker to the heart are already in place. Risks of this procedure include bleeding, infection, and allergic reactions to medicines. This information is not intended to replace advice given to you by your health care provider. Make sure you discuss any questions you have with your healthcare provider. Document Revised: 12/28/2018 Document Reviewed: 12/28/2018 Elsevier Patient Education  2022 [employer].

## 2020-10-06 NOTE — Progress Notes (Signed)
PCP: Lajean Manes, MD Primary Cardiologist: Dr Claiborne Billings Primary EP:  Dr Rayann Heman  Thomas Long is a 87 y.o. male who presents today for routine electrophysiology followup.  Since last being seen in our clinic, the patient reports doing reasonably well.  He has advanced dementia.  History is per his care giver (daughter) today.   She denies cardiac concerns.   Past Medical History:  Diagnosis Date   CAD (coronary artery disease)    a. LHC 10/2014: nonobstructive disease, 30% LAD with bridging up to 50%.   CHF (congestive heart failure) ([place]) 2016   "this was the reason for his pacemaker" (08/23/2017)   Chronic pain    Complete heart block ([place]) 11/07/2014   a. s/p [facility] Quadra Allure MP RF model [id number] (serial number  [id number]) biventricular pacemaker 10/2014.   Dementia ([place])    "memory lapses q now and then; dx'd as delayed memory" (08/23/2017)   Depression    Facial cellulitis 02/19/2009   "related to [facility] & having skin cancer zapped; took him off the [place]"   History of hiatal hernia 1980s   Hyperlipidemia    Hypertension    NICM (nonischemic cardiomyopathy) ([place])    a. 10/2014: EF 45%. (Prev 40-45% by echo 08/2014).   Orthostatic hypotension    Presence of permanent cardiac pacemaker    Rheumatoid arthritis ([place])    "hands mainly; knees"  (08/23/2017)   Seizures ([place]) 2008   "vs stroke; never determined which it was; on sz RX since"  (08/23/2017)   Sleep apnea 2008   "gone since losing weight" (08/23/2017)   Stroke ([place]) 2008   "vs seizure; never determined which it was; on sz RX since"   (08/23/2017)   TIA (transient ischemic attack) 2008   "vs seizure; never determined which it was; on sz RX since"   (08/23/2017)   Past Surgical History:  Procedure Laterality Date   APPENDECTOMY  ~ [place]  04/01/09   which showed low normal EF at 50% with question of underlying borderline area of minimal inferoapical hypercontractility. He had mild coronary  obstructive disease with coronary calcification and segmental 20% narrowing in the LAD proximally, 20% in the mid LAD with muscle bridging. There was calcification at the ostium of the RCA, and 20 to 30%narrowing of the proximal to mid RCA with 30% distal n   CARDIAC CATHETERIZATION N/A 11/07/2014   Procedure: Left Heart Cath and Coronary Angiography/ temp wire;  Surgeon: [name], MD;  Location: [facility];  Service: Cardiovascular;  Laterality: N/A;   CARDIOVERSION N/A 07/19/2017   Procedure: CARDIOVERSION;  Surgeon: Acie Fredrickson, Wonda Cheng, MD;  Location: [facility];  Service: Cardiovascular;  Laterality: N/A;   CATARACT EXTRACTION W/ INTRAOCULAR LENS  IMPLANT, BILATERAL Bilateral early 2000's   CHOLECYSTECTOMY OPEN  1980's   EP IMPLANTABLE DEVICE N/A 11/08/2014   a. STJ CRTP implanted by Dr Rayann Heman   EP IMPLANTABLE DEVICE N/A 11/10/2014   RA lead revision Dr Rayann Heman   EXPLORATORY LAPAROTOMY  1990's   "put intestines back in & added mesh"   FALSE ANEURYSM REPAIR Right 11/15/2014   Procedure: REPAIR OF RIGHT FEMORAL FALSE ANEURYSM ;  Surgeon: Rosetta Posner, MD;  Location[facility];  Service: Vascular;  Laterality: Right;   [place] Left 1990's   "opened it up"   TONSILLECTOMY      ROS- all systems are reviewed and negative  except as per HPI above  Current Outpatient Medications  Medication Sig Dispense Refill   acetaminophen (TYLENOL) 500 MG tablet Take 500 mg by mouth every 6 (six) hours as needed for moderate pain or headache.     atorvastatin (LIPITOR) 10 MG tablet TAKE 1 TABLET DAILY 90 tablet 2   cetirizine (ZYRTEC) 10 MG tablet Take 10 mg by mouth daily.     dofetilide (TIKOSYN) 250 MCG capsule Take 1 capsule (250 mcg total) by mouth 2 (two) times daily. 180 capsule 2   latanoprost (XALATAN) 0.005 % ophthalmic solution Place 1 drop into both eyes at bedtime.   3   levETIRAcetam (KEPPRA XR) 500 MG 24 hr tablet TAKE 1 TABLET BY MOUTH EVERY DAY 90  tablet 3   memantine (NAMENDA) 10 MG tablet TAKE 1 TABLET BY MOUTH TWICE A DAY 180 tablet 3   metoprolol succinate (TOPROL-XL) 25 MG 24 hr tablet Take 1 tablet (25 mg total) by mouth daily. 30 tablet 11   Multiple Vitamins-Minerals (CENTRUM SILVER ADULT 50+ PO) Take 1 capsule by mouth daily.     rivaroxaban (XARELTO) 20 MG TABS tablet Take 1 tablet (20 mg total) by mouth daily with supper. 90 tablet 2   No current facility-administered medications for this visit.    Physical Exam: Vitals:   10/06/20 1015  BP: (!) 92/54  Pulse: 85  SpO2: 93%  Weight: 164 lb 6.4 oz (74.6 kg)  Height: '5\' 8"'$  (1.727 m)    GEN- The patient is elderly and frail appearing, alert but nonverbal today Head- normocephalic, atraumatic Eyes-  Sclera clear, conjunctiva pink Ears- hearing intact Oropharynx- clear Lungs- Clear to ausculation bilaterally, normal work of breathing Chest- pacemaker pocket is well healed Heart- Regular rate and rhythm, no murmurs, rubs or gallops, PMI not laterally displaced GI- soft, NT, ND, + BS Extremities- no clubbing, cyanosis, or edema  Pacemaker interrogation- reviewed in detail today,  See PACEART report  ekg tracing ordered today is personally reviewed and shows sinus with BiV pacing  Assessment and Plan:  1. Symptomatic complete heart block Normal BiV pacemaker function See Pace Art report No changes today he is device dependant today Approaching ERI (<3 months).  Given SJM battery longevity prediction, I worry about the longevity of this device.  I would therefore advise that we proceed with pacemaker generator change within the next 2 months.  Echo 12/29/18- EF 55%  Risks, benefits, and alternatives to PPM pulse generator replacement were discussed in detail today with the patient and his daughter (medical POA).  The patient and his daughter understand that risks include but are not limited to bleeding, infection, pneumothorax, perforation, tamponade, vascular  damage, renal failure, MI, stroke, death,   damage to his existing leads, and lead dislodgement and wishes to proceed.  We will therefore schedule the procedure within the next 6 weeks or so.  Daughter prefers that we try to avoid sedation for the procedure if possible given his dementia.  2. Persistent afib Well controlled (burden <1%) with tikosyn Labs 05/02/20 reviewed Hold xarelto 24 hours prior to generator change Bmet, mg, and cbc ordered today  3. Chronic systolic dysfunction/ nonischemic CM Clinically stable EF has improved with CRT  4. HTN Stable No change required today  Care provided with care giver (daughter) today  [name] MD, [facility] 10/06/2020 10:32 AM

## 2020-10-07 ENCOUNTER — Ambulatory Visit (INDEPENDENT_AMBULATORY_CARE_PROVIDER_SITE_OTHER): Payer: Medicare Other

## 2020-10-07 DIAGNOSIS — I5022 Chronic systolic (congestive) heart failure: Secondary | ICD-10-CM | POA: Diagnosis not present

## 2020-10-07 DIAGNOSIS — Z950 Presence of cardiac pacemaker: Secondary | ICD-10-CM | POA: Diagnosis not present

## 2020-10-07 NOTE — Progress Notes (Signed)
EPIC Encounter for ICM Monitoring  Patient Name: Thomas Long is a 87 y.o. male Date: 10/07/2020 Primary Care Physican: Lajean Manes, MD Primary Cardiologist: Claiborne Billings Electrophysiologist: Allred Bi-V Pacing: >99%        AT/AF Burden: <1% (taking Xarelto)   Battery Longevity: 2.7 months     Transmission reviewed. Pt in Palliative care.   CorVue thoracic impedance suggesting normal fluid levels.    No diuretic.       Recommendations:  No changes.   Follow-up plan: ICM clinic phone appointment on 11/10/2020.   91 day device clinic remote transmission 10/15/2020.     EP/Cardiology Office Visits:   Recall 05/21/2021 with Dr. Claiborne Billings.     Copy of ICM check sent to Dr. Rayann Heman.   3 month ICM trend: 10/07/2020.    1 Year ICM trend:       Rosalene Billings, RN 10/07/2020 2:47 PM

## 2020-10-08 ENCOUNTER — Other Ambulatory Visit: Payer: Self-pay

## 2020-10-08 ENCOUNTER — Other Ambulatory Visit: Payer: Medicare Other | Admitting: *Deleted

## 2020-10-08 ENCOUNTER — Other Ambulatory Visit: Payer: Medicare Other

## 2020-10-08 VITALS — BP 92/56 | HR 85 | Temp 97.7°F | Resp 16

## 2020-10-08 DIAGNOSIS — Z515 Encounter for palliative care: Secondary | ICD-10-CM

## 2020-10-08 NOTE — Progress Notes (Signed)
COMMUNITY PALLIATIVE CARE SW NOTE  PATIENT NAME: Thomas Long DOB: 1931/03/25 MRN: [id number]  PRIMARY CARE PROVIDER: Lajean Manes, MD  RESPONSIBLE PARTY:  Acct ID - Guarantor Home Phone Work Phone Relationship Acct Type  [id number] Thomas Long, [phone]  Self P/F     [address], [place], [place] [address]   COVID-19 Pre-screening: Negative  PLAN OF CARE and INTERVENTIONS:             GOALS OF CARE/ ADVANCE CARE PLANNING:  Goal of care is for patient to remain in her home. Patient is a DNR. SOCIAL/EMOTIONAL/SPIRITUAL ASSESSMENT/ INTERVENTIONS:  SW and RN-M. Nadara Mustard completed a check-in visit with patient at his home where he was present with his wife and daughter. He was sitting up in his wheelchair, initially awake and alert. He was in and out of sleep during the visit. He did not appear to be in any pain or discomfort. His wife and daughter provided a status update on patient. Patient is only awake about 6-8 hours out of a 24 hour day. The family has been using skin prep on his heels/foot and it has shown some improvement. Patient is having increased periods of incontinence. His fluid and food intake has declined. He is refusing personal care and medications intermittently where the family have to reproach him. He often does not recognize family members, particularly his wife. Patient is having increased involuntary tremors in his right hand and arm. Patient is scheduled to have his pacemaker battery change in November 13, 2020 as he only 6 months left on his battery life. His daughter and wife are concerned about the surgery and how it will affect patient. Patient's family try to keep him engaged, however his activity level is declining. Patient's daughter continue to come in daily and assist with patient's care. His son lives in the home and also assist with patient care. SW provided supportive presence, encouragement, education, assessment of patient needs and comfort, coping and needs of  PCG/family and reassurance of support. The family is open to ongoing palliative care/SW support. PATIENT/CAREGIVER EDUCATION/ COPING:  Patient is sleeping more and seems more cognitively declined. His family seems to be realistic and coping well.  PERSONAL EMERGENCY PLAN:  911 can abe activated for emergencies. COMMUNITY RESOURCES COORDINATION/ HEALTH CARE NAVIGATION:  None FINANCIAL/LEGAL CONCERNS/INTERVENTIONS:  None     SOCIAL HX:  Social History   Tobacco Use   Smoking status: Never   Smokeless tobacco: Never  Substance Use Topics   Alcohol use: Not Currently    Alcohol/week: 0.0 standard drinks    CODE STATUS: DNR ADVANCED DIRECTIVES: Yes MOST FORM COMPLETE:  Yes HOSPICE EDUCATION PROVIDED: No  PPS: Patient is alert and oriented to self and situation only. He is sleeping more and having increased cognitive declined. He is dependent for personal care needs.   Duration of visit and documentation: 60 minutes.  [address] [place], [place]

## 2020-10-08 NOTE — Progress Notes (Signed)
[facility] COMMUNITY PALLIATIVE CARE RN NOTE  PATIENT NAME: Thomas Long DOB: February 02, 1932 MRN: [id number]  PRIMARY CARE PROVIDER: Lajean Manes, MD  RESPONSIBLE PARTY:  Acct ID - Guarantor Home Phone Work Phone Relationship Acct Type  [id number] [name], [name]  Self P/F     [place], [place], [place] [address]   Covid-19 Pre-screening Negative  PLAN OF CARE and INTERVENTION:  ADVANCE CARE PLANNING/GOALS OF CARE: Goal is for patient to remain in his home. He has a DNR. PATIENT/CAREGIVER EDUCATION: Symptom management, safe mobility/transfers DISEASE STATUS: Joint follow up palliative care visit completed with LCSW, Thomas Long. Met with patient, wife and daughter in their home. Patient is sitting up in his recliner awake. He is able to say a few clear words, but has have word-finding difficulties and speech unintelligible at times. He is slow to respond. He denies pain or discomfort. Wife and daughter have noticed that patient is sleeping more overall. He is awake about 6-8 hours/day. He is falling asleep during visit. He does refuse care and medications at times and they have to re-approach. There are times when he doesn't recognize his wife and is saying that he wants to go home. He is no longer making food choices. Daughter has spoken with his PCP to ask if Memantine is necessary at this point to decrease pill burden. They are currently weaning him off of this medication. He remains ambulatory with assistance. At times he may lose his balance and start fall over, but someone is always there to make sure he doesn't fall. No recent falls. He is tiring more easily. They are noticing an increase in his right hand tremors. He is less active. He had an appointment with Thomas Long who monitors his pacemaker. There is less than 6 months left on his current battery. He is to have his pacemaker battery changed on Oct 6. He is unable to have generalized anesthesia so a local one will be used  for the procedure. His intake is slowly decreasing and is eating smaller portions. He is no longer snacking between meals and eating 2 meals/day. Continues with some bladder and bowel incontinence and wears Depends. He does have some issues with constipation so they will give Senokot to help. He was seen by Podiatry recently who says he has big toe trauma, as blood was noted underneath his nail. They recommended that they go up 1/2 size in his shoes. This seems to be working better. They are applying skin prep to his left heel pressure injury and is improving. Will follow up with patient in 6 weeks and prn.  HISTORY OF PRESENT ILLNESS: This is a 87 yo male with a history of CHF, Afib, hypertension, TIA, pacemaker, seizures and hyperlipidemia. Palliative care team continues to follow patient for goals of care, symptom management and complex decision making.  CODE STATUS: DNR ADVANCED DIRECTIVES: Y MOST FORM: yes PPS: 40%   PHYSICAL EXAM:   VITALS: Today's Vitals   10/08/20 1158  BP: (!) 92/56  Pulse: 85  Resp: 16  Temp: 97.7 F (36.5 C)  TempSrc: Temporal  SpO2: 93%  PainSc: 0-No pain    LUNGS: clear to auscultation  CARDIAC: Cor RRR, pacemaker EXTREMITIES: No edema SKIN:  Stage 2 pressure injury to left heel, improving with skin prep   NEURO:  Alert and oriented to self, intermittent confusion, expressive aphasia, generalized weakness, ambulatory w/walker and 1 person assist.    (Duration of visit and documentation 60 minutes)   Thomas [place],  RN BSN

## 2020-10-14 ENCOUNTER — Encounter: Payer: Self-pay | Admitting: Adult Health

## 2020-10-15 ENCOUNTER — Ambulatory Visit (INDEPENDENT_AMBULATORY_CARE_PROVIDER_SITE_OTHER): Payer: Medicare Other

## 2020-10-15 DIAGNOSIS — I428 Other cardiomyopathies: Secondary | ICD-10-CM

## 2020-10-15 LAB — CUP PACEART REMOTE DEVICE CHECK
Battery Remaining Longevity: 1 mo
Battery Remaining Percentage: 2 %
Battery Voltage: 2.69 V
Brady Statistic AP VP Percent: 1 %
Brady Statistic AP VS Percent: 1 %
Brady Statistic AS VP Percent: 99 %
Brady Statistic AS VS Percent: 1 %
Brady Statistic RA Percent Paced: 1 %
Date Time Interrogation Session: 20220907082701
Implantable Lead Implant Date: 20160930
Implantable Lead Implant Date: 20160930
Implantable Lead Implant Date: 20160930
Implantable Lead Location: 753858
Implantable Lead Location: 753859
Implantable Lead Location: 753860
Implantable Pulse Generator Implant Date: 20160930
Lead Channel Impedance Value: 340 Ohm
Lead Channel Impedance Value: 560 Ohm
Lead Channel Impedance Value: 690 Ohm
Lead Channel Pacing Threshold Amplitude: 0.75 V
Lead Channel Pacing Threshold Amplitude: 1.125 V
Lead Channel Pacing Threshold Amplitude: 1.5 V
Lead Channel Pacing Threshold Pulse Width: 0.5 ms
Lead Channel Pacing Threshold Pulse Width: 0.5 ms
Lead Channel Pacing Threshold Pulse Width: 0.8 ms
Lead Channel Sensing Intrinsic Amplitude: 12 mV
Lead Channel Sensing Intrinsic Amplitude: 3 mV
Lead Channel Setting Pacing Amplitude: 2 V
Lead Channel Setting Pacing Amplitude: 2.125
Lead Channel Setting Pacing Amplitude: 2.75 V
Lead Channel Setting Pacing Pulse Width: 0.5 ms
Lead Channel Setting Pacing Pulse Width: 0.8 ms
Lead Channel Setting Sensing Sensitivity: 5 mV
Pulse Gen Model: 3262
Pulse Gen Serial Number: 7798436

## 2020-10-23 NOTE — Progress Notes (Signed)
Remote pacemaker transmission.   

## 2020-10-28 ENCOUNTER — Other Ambulatory Visit: Payer: Medicare Other

## 2020-10-28 ENCOUNTER — Other Ambulatory Visit: Payer: Self-pay

## 2020-10-28 DIAGNOSIS — I5022 Chronic systolic (congestive) heart failure: Secondary | ICD-10-CM

## 2020-10-28 DIAGNOSIS — I480 Paroxysmal atrial fibrillation: Secondary | ICD-10-CM

## 2020-10-28 DIAGNOSIS — I442 Atrioventricular block, complete: Secondary | ICD-10-CM

## 2020-10-28 LAB — CBC WITH DIFFERENTIAL/PLATELET
Basophils Absolute: 0.1 10*3/uL (ref 0.0–0.2)
Basos: 1 %
EOS (ABSOLUTE): 0.3 10*3/uL (ref 0.0–0.4)
Eos: 3 %
Hematocrit: 34.8 % — ABNORMAL LOW (ref 37.5–51.0)
Hemoglobin: 12 g/dL — ABNORMAL LOW (ref 13.0–17.7)
Immature Grans (Abs): 0 10*3/uL (ref 0.0–0.1)
Immature Granulocytes: 0 %
Lymphocytes Absolute: 0.6 10*3/uL — ABNORMAL LOW (ref 0.7–3.1)
Lymphs: 7 %
MCH: 32.3 pg (ref 26.6–33.0)
MCHC: 34.5 g/dL (ref 31.5–35.7)
MCV: 94 fL (ref 79–97)
Monocytes Absolute: 0.5 10*3/uL (ref 0.1–0.9)
Monocytes: 6 %
Neutrophils Absolute: 7.6 10*3/uL — ABNORMAL HIGH (ref 1.4–7.0)
Neutrophils: 83 %
Platelets: 303 10*3/uL (ref 150–450)
RBC: 3.71 x10E6/uL — ABNORMAL LOW (ref 4.14–5.80)
RDW: 11.8 % (ref 11.6–15.4)
WBC: 9 10*3/uL (ref 3.4–10.8)

## 2020-10-28 LAB — BASIC METABOLIC PANEL
BUN/Creatinine Ratio: 25 — ABNORMAL HIGH (ref 10–24)
BUN: 26 mg/dL (ref 8–27)
CO2: 23 mmol/L (ref 20–29)
Calcium: 8.6 mg/dL (ref 8.6–10.2)
Chloride: 101 mmol/L (ref 96–106)
Creatinine, Ser: 1.03 mg/dL (ref 0.76–1.27)
Glucose: 138 mg/dL — ABNORMAL HIGH (ref 65–99)
Potassium: 4.2 mmol/L (ref 3.5–5.2)
Sodium: 139 mmol/L (ref 134–144)
eGFR: 70 mL/min/{1.73_m2} (ref >59)

## 2020-10-28 LAB — MAGNESIUM: Magnesium: 1.9 mg/dL (ref 1.6–2.3)

## 2020-11-10 ENCOUNTER — Ambulatory Visit (INDEPENDENT_AMBULATORY_CARE_PROVIDER_SITE_OTHER): Payer: Medicare Other

## 2020-11-10 DIAGNOSIS — I5022 Chronic systolic (congestive) heart failure: Secondary | ICD-10-CM

## 2020-11-10 DIAGNOSIS — Z950 Presence of cardiac pacemaker: Secondary | ICD-10-CM | POA: Diagnosis not present

## 2020-11-12 ENCOUNTER — Telehealth: Payer: Self-pay

## 2020-11-12 NOTE — Telephone Encounter (Signed)
Spoke to daughter and wife. Answered all questions. Updated Dr. Rayann Heman.

## 2020-11-12 NOTE — Telephone Encounter (Signed)
The patient daughter is wanting Otila Kluver, rn to give her a call. The patient is getting a gen change tomorrow and she just wanted to let the nurse know a couple of things. Her phone number is [phone].

## 2020-11-12 NOTE — Progress Notes (Signed)
EPIC Encounter for ICM Monitoring  Patient Name: Thomas Long is a 87 y.o. male Date: 11/12/2020 Primary Care Physican: Lajean Manes, MD Primary Cardiologist: Claiborne Billings Electrophysiologist: Allred Bi-V Pacing: >99%        AT/AF Burden: <1% (taking Xarelto)     Transmission reviewed. Pt in Palliative care.  Device battery replacement scheduled for 10/6.   CorVue thoracic impedance suggesting normal fluid levels.    No diuretic.       Recommendations:  No changes.   Follow-up plan: ICM clinic phone appointment on 01/05/2021.   91 day device clinic remote transmission pending device replacment.     EP/Cardiology Office Visits:   Recall 05/21/2021 with Dr. Claiborne Billings.     Copy of ICM check sent to Dr. Rayann Heman.    3 month ICM trend: 11/10/2020.    1 Year ICM trend:       Rosalene Billings, RN 11/12/2020 2:55 PM

## 2020-11-13 ENCOUNTER — Encounter (HOSPITAL_COMMUNITY)
Admission: RE | Disposition: A | Discharge status: Home / Self Care | Payer: Medicare Other | Source: Home / Self Care | Attending: Internal Medicine

## 2020-11-13 ENCOUNTER — Other Ambulatory Visit: Payer: Self-pay

## 2020-11-13 ENCOUNTER — Ambulatory Visit (HOSPITAL_COMMUNITY)
Admission: RE | Admit: 2020-11-13 | Discharge: 2020-11-13 | Disposition: A | Discharge status: Home / Self Care | Payer: Medicare Other | Attending: Internal Medicine | Admitting: Internal Medicine

## 2020-11-13 DIAGNOSIS — Z7901 Long term (current) use of anticoagulants: Secondary | ICD-10-CM | POA: Diagnosis not present

## 2020-11-13 DIAGNOSIS — I480 Paroxysmal atrial fibrillation: Secondary | ICD-10-CM | POA: Diagnosis not present

## 2020-11-13 DIAGNOSIS — Z79899 Other long term (current) drug therapy: Secondary | ICD-10-CM | POA: Diagnosis not present

## 2020-11-13 DIAGNOSIS — F0390 Unspecified dementia without behavioral disturbance: Secondary | ICD-10-CM | POA: Insufficient documentation

## 2020-11-13 DIAGNOSIS — I442 Atrioventricular block, complete: Secondary | ICD-10-CM | POA: Insufficient documentation

## 2020-11-13 DIAGNOSIS — Z45010 Encounter for checking and testing of cardiac pacemaker pulse generator [battery]: Secondary | ICD-10-CM | POA: Insufficient documentation

## 2020-11-13 HISTORY — PX: PPM GENERATOR CHANGEOUT: EP1233

## 2020-11-13 SURGERY — PPM GENERATOR CHANGEOUT

## 2020-11-13 MED ORDER — ONDANSETRON HCL 4 MG/2ML IJ SOLN: 4.0000 mg | Freq: Four times a day (QID) | INTRAMUSCULAR | Status: DC | PRN

## 2020-11-13 MED ORDER — POVIDONE-IODINE 10 % EX SWAB: 2.0000 "application " | Freq: Once | CUTANEOUS | Status: DC

## 2020-11-13 MED ORDER — SODIUM CHLORIDE 0.9 % IV SOLN
80.0000 mg | INTRAVENOUS | Status: AC
  Administered 2020-11-13: 80 mg

## 2020-11-13 MED ORDER — SODIUM CHLORIDE 0.9% FLUSH: 3.0000 mL | Freq: Two times a day (BID) | INTRAVENOUS | Status: DC

## 2020-11-13 MED ORDER — LIDOCAINE HCL (PF) 1 % IJ SOLN
INTRAMUSCULAR | Status: AC
  Filled 2020-11-13: qty 30

## 2020-11-13 MED ORDER — SODIUM CHLORIDE 0.9 % IV SOLN: INTRAVENOUS | Status: DC

## 2020-11-13 MED ORDER — SODIUM CHLORIDE 0.9 % IV SOLN
INTRAVENOUS | Status: AC
  Filled 2020-11-13: qty 2

## 2020-11-13 MED ORDER — SODIUM CHLORIDE 0.9 % IV SOLN: 250.0000 mL | INTRAVENOUS | Status: DC | PRN

## 2020-11-13 MED ORDER — SODIUM CHLORIDE 0.9% FLUSH: 3.0000 mL | INTRAVENOUS | Status: DC | PRN

## 2020-11-13 MED ORDER — ACETAMINOPHEN 325 MG PO TABS
325.0000 mg | ORAL_TABLET | ORAL | Status: DC | PRN
  Filled 2020-11-13: qty 2

## 2020-11-13 MED ORDER — LIDOCAINE HCL (PF) 1 % IJ SOLN
INTRAMUSCULAR | Status: DC | PRN
  Administered 2020-11-13: 60 mL via INTRADERMAL

## 2020-11-13 MED ORDER — CHLORHEXIDINE GLUCONATE 4 % EX LIQD: 4.0000 "application " | Freq: Once | CUTANEOUS | Status: DC

## 2020-11-13 MED ORDER — CEFAZOLIN SODIUM-DEXTROSE 2-4 GM/100ML-% IV SOLN
2.0000 g | INTRAVENOUS | Status: AC
  Administered 2020-11-13: 2 g via INTRAVENOUS

## 2020-11-13 MED ORDER — CEFAZOLIN SODIUM-DEXTROSE 2-4 GM/100ML-% IV SOLN
INTRAVENOUS | Status: AC
  Filled 2020-11-13: qty 100

## 2020-11-13 SURGICAL SUPPLY — 5 items
CABLE SURGICAL S-101-97-12 (CABLE) ×2 IMPLANT
PACEMAKER QUDR ALLR CRT PM3562 (Pacemaker) IMPLANT
PAD PRO RADIOLUCENT 2001M-C (PAD) ×2 IMPLANT
PMKR QUADRA ALLURE CRT PM3562 (Pacemaker) ×2 IMPLANT
TRAY PACEMAKER INSERTION (PACKS) ×2 IMPLANT

## 2020-11-13 NOTE — Progress Notes (Addendum)
Discharge instructions reviewed with pt and his daughter voices understanding. Pt with history of dementia

## 2020-11-13 NOTE — Discharge Instructions (Addendum)
Resume xarelto on 11/16/2020

## 2020-11-13 NOTE — H&P (Signed)
PCP: Lajean Manes, MD Primary Cardiologist: Dr Claiborne Billings Primary EP:  Dr Rayann Heman   Thomas Long is a 87 y.o. male who presents today for pacemaker generator change.  Since last being seen in our clinic, the patient reports doing reasonably well.  He has advanced dementia.  History is per his care giver (daughter) today.   She denies cardiac concerns.        Past Medical History:  Diagnosis Date   CAD (coronary artery disease)      a. LHC 10/2014: nonobstructive disease, 30% LAD with bridging up to 50%.   CHF (congestive heart failure) ([place]) 2016    "this was the reason for his pacemaker" (08/23/2017)   Chronic pain     Complete heart block ([place]) 11/07/2014    a. s/p [facility] Quadra Allure MP RF model [id number] (serial number  [id number]) biventricular pacemaker 10/2014.   Dementia ([place])      "memory lapses q now and then; dx'd as delayed memory" (08/23/2017)   Depression     Facial cellulitis 02/19/2009    "related to [facility] & having skin cancer zapped; took him off the [place]"   History of hiatal hernia 1980s   Hyperlipidemia     Hypertension     NICM (nonischemic cardiomyopathy) ([place])      a. 10/2014: EF 45%. (Prev 40-45% by echo 08/2014).   Orthostatic hypotension     Presence of permanent cardiac pacemaker     Rheumatoid arthritis ([place])      "hands mainly; knees"  (08/23/2017)   Seizures ([place]) 2008    "vs stroke; never determined which it was; on sz RX since"  (08/23/2017)   Sleep apnea 2008    "gone since losing weight" (08/23/2017)   Stroke ([place]) 2008    "vs seizure; never determined which it was; on sz RX since"   (08/23/2017)   TIA (transient ischemic attack) 2008    "vs seizure; never determined which it was; on sz RX since"   (08/23/2017)         Past Surgical History:  Procedure Laterality Date   APPENDECTOMY   ~ [place]   04/01/09    which showed low normal EF at 50% with question of underlying borderline area of minimal inferoapical  hypercontractility. He had mild coronary obstructive disease with coronary calcification and segmental 20% narrowing in the LAD proximally, 20% in the mid LAD with muscle bridging. There was calcification at the ostium of the RCA, and 20 to 30%narrowing of the proximal to mid RCA with 30% distal n   CARDIAC CATHETERIZATION N/A 11/07/2014    Procedure: Left Heart Cath and Coronary Angiography/ temp wire;  Surgeon: [name], MD;  Location: [place] [facility];  Service: Cardiovascular;  Laterality: N/A;   CARDIOVERSION N/A 07/19/2017    Procedure: CARDIOVERSION;  Surgeon: Acie Fredrickson, Wonda Cheng, MD;  Location: [place];  Service: Cardiovascular;  Laterality: N/A;   CATARACT EXTRACTION W/ INTRAOCULAR LENS  IMPLANT, BILATERAL Bilateral early 2000's   CHOLECYSTECTOMY OPEN   1980's   EP IMPLANTABLE DEVICE N/A 11/08/2014    a. STJ CRTP implanted by Dr Rayann Heman   EP IMPLANTABLE DEVICE N/A 11/10/2014    RA lead revision Dr Rayann Heman   EXPLORATORY LAPAROTOMY   1990's    "put intestines back in & added mesh"   FALSE ANEURYSM REPAIR Right 11/15/2014    Procedure: REPAIR OF RIGHT FEMORAL FALSE ANEURYSM ;  Surgeon: Rosetta Posner, MD;  Location: [facility];  Service: Vascular;  Laterality: Right;   [place] Left 1990's    "opened it up"   TONSILLECTOMY          ROS- all systems are reviewed and negative except as per HPI above         Current Outpatient Medications  Medication Sig Dispense Refill   acetaminophen (TYLENOL) 500 MG tablet Take 500 mg by mouth every 6 (six) hours as needed for moderate pain or headache.       atorvastatin (LIPITOR) 10 MG tablet TAKE 1 TABLET DAILY 90 tablet 2   cetirizine (ZYRTEC) 10 MG tablet Take 10 mg by mouth daily.       dofetilide (TIKOSYN) 250 MCG capsule Take 1 capsule (250 mcg total) by mouth 2 (two) times daily. 180 capsule 2   latanoprost (XALATAN) 0.005 % ophthalmic solution Place 1 drop into both eyes at bedtime.    3   levETIRAcetam  (KEPPRA XR) 500 MG 24 hr tablet TAKE 1 TABLET BY MOUTH EVERY DAY 90 tablet 3   memantine (NAMENDA) 10 MG tablet TAKE 1 TABLET BY MOUTH TWICE A DAY 180 tablet 3   metoprolol succinate (TOPROL-XL) 25 MG 24 hr tablet Take 1 tablet (25 mg total) by mouth daily. 30 tablet 11   Multiple Vitamins-Minerals (CENTRUM SILVER ADULT 50+ PO) Take 1 capsule by mouth daily.       rivaroxaban (XARELTO) 20 MG TABS tablet Take 1 tablet (20 mg total) by mouth daily with supper. 90 tablet 2    No current facility-administered medications for this visit.      Physical Exam: Vitals:   11/13/20 1145  BP: 108/76  Pulse: 86  Temp: 97.6 F (36.4 C)  SpO2: 99%     GEN- The patient is elderly and frail appearing, alert but nonverbal today Head- normocephalic, atraumatic Eyes-  Sclera clear, conjunctiva pink Ears- hearing intact Oropharynx- clear Lungs- Clear to ausculation bilaterally, normal work of breathing Chest- pacemaker pocket is well healed Heart- Regular rate and rhythm, no murmurs, rubs or gallops, PMI not laterally displaced GI- soft, NT, ND, + BS Extremities- no clubbing, cyanosis, or edema    Assessment and Plan:   1. Symptomatic complete heart block Normal BiV pacemaker function though now at end of his battery life. Given SJM battery longevity prediction, I worry about the longevity of this device.  the patient is device dependant and has difficulty with transportation.  It is felt most prudent for patient safety to proceed with generator change at this time.   Echo 12/29/18- EF 55%   Risks, benefits, and alternatives to PPM pulse generator replacement were discussed in detail today with the patient and his daughter (medical POA) again today.  The patient and his daughter understand that risks include but are not limited to bleeding, infection, pneumothorax, perforation, tamponade, vascular damage, renal failure, MI, stroke, death,   damage to his existing leads, and lead dislodgement and  wishes to proceed.     [name] MD, [place] 11/13/2020 12:38 PM

## 2020-11-14 ENCOUNTER — Encounter (HOSPITAL_COMMUNITY): Payer: Self-pay | Admitting: Internal Medicine

## 2020-11-25 ENCOUNTER — Other Ambulatory Visit: Payer: Medicare Other

## 2020-11-25 ENCOUNTER — Other Ambulatory Visit: Payer: Medicare Other | Admitting: *Deleted

## 2020-11-25 ENCOUNTER — Other Ambulatory Visit: Payer: Self-pay

## 2020-11-25 VITALS — BP 91/55 | HR 73 | Temp 97.6°F | Resp 16 | Wt 162.0 lb

## 2020-11-25 DIAGNOSIS — Z515 Encounter for palliative care: Secondary | ICD-10-CM

## 2020-11-25 NOTE — Progress Notes (Signed)
[facility] COMMUNITY PALLIATIVE CARE RN NOTE  PATIENT NAME: Thomas Long DOB: 12-Nov-1931 MRN: [id number]  PRIMARY CARE PROVIDER: Lajean Manes, MD  RESPONSIBLE PARTY:  Acct ID - Guarantor Home Phone Work Phone Relationship Acct Type  [id number] MAANAV, [phone]  Self P/F     [place], [place], [place] [address]   Covid-19 Pre-screening Negative  PLAN OF CARE and INTERVENTION:  ADVANCE CARE PLANNING/GOALS OF CARE:Goal is for patient to remain at home with his wife. He has a DNR. PATIENT/CAREGIVER EDUCATION: Symptom management, bowel regimen, safe mobility/transfers DISEASE STATUS: Joint follow-up visit made with LCSW, M. Lonon. Met with patient and his daughter in the home. Upon arrival, patient is sitting up in his recliner awake and alert. He remained awake throughout this entire visit and was more engaging than usual. He remains slow to respond with word finding difficulties, but was understood better today. He is having a good day. He is experiencing tooth pain in the furthest right molar. Upon assessment I noticed a small space at the gumline between his gum and tooth and a tiny white spot on the gum. Daughter is going to contact his Dentist for further assessment. He recently had his pacemaker battery replaced and tolerated this well. At that time he also received some maintenance IV fluids and IV antibiotics that perked him up for a few days. He then reverted back to sleeping more. He has an incision check tomorrow. Daughter says he still has moments of agitation and frustration. Refuses medications at times and/or spits them out and she has to re-approach. He is starting to chew his medications. He is not having agitation at night like he used to or asking to go home. He is sleeping throughout the night. He remains ambulatory with his walker with assistance but is slower to stand. He is shuffling more and has to be shown how to walk. He requires guidance as he doesn't  know how to navigate his home anymore. He slid out of bed x 2 about 2 weeks ago without injury. He is having some issues with constipation. He went 2 weeks without having a BM. They started using Senokot but it was unsuccessful. Wife gave him a suppository which was effective. LBM 3 days ago. Recommended trying to use Senokot every other day to see if this helps with constipation since he is less mobile and drinking less water. His appetite is variable. Weight today 162 lbs. He is lost 2 lbs over the past 2 months, but 11 lbs over the past 6 months. They continue to apply Curel and vaseline to his left heel for his chaffed skin. He has a hardened area to his left outer knee possibly caused from pressure of him constantly crossing his legs. Area is intact. Follow up visit scheduled in 6 weeks.   HISTORY OF PRESENT ILLNESS: This is a 87 yo male with a history of CHF, Afib, hypertension, TIA, pacemaker, seizures and hyperlipidemia. Palliative care team continues to follow patient for goals of care, symptom management and complex decision making.   CODE STATUS: DNR ADVANCED DIRECTIVES: Y MOST FORM: yes PPS: 50%   PHYSICAL EXAM:   VITALS: Today's Vitals   11/25/20 1142  BP: (!) 91/55  Pulse: 73  Resp: 16  Temp: 97.6 F (36.4 C)  TempSrc: Temporal  SpO2: 95%  PainSc: 2   PainLoc: Teeth    LUNGS: clear to auscultation  CARDIAC: Cor RRR, pacemaker EXTREMITIES: No edema SKIN:  See above note   NEURO:  Alert and oriented to self, intermittent confusion, expressive aphasia, generalized weakness, ambulatory w/walker and 1 person assistance    (Duration of visit and documentation 60 minutes)   Daryl Eastern, RN BSN

## 2020-11-25 NOTE — Progress Notes (Signed)
COMMUNITY PALLIATIVE CARE SW NOTE  PATIENT NAME: Thomas Long DOB: 06-04-1931 MRN: [id number]  PRIMARY CARE PROVIDER: Lajean Manes, MD  RESPONSIBLE PARTY:  Acct ID - Guarantor Home Phone Work Phone Relationship Acct Type  [id number] Thomas Long, [phone]  Self P/F     [place], [place], [place] [address]     PLAN OF CARE and INTERVENTIONS:             GOALS OF CARE/ ADVANCE CARE PLANNING:  Goal is for patient to remain in her home. Patient is a DNR. SOCIAL/EMOTIONAL/SPIRITUAL ASSESSMENT/ INTERVENTIONS:  SW and RN-M. Nadara Mustard completed a check-in visit with patient at his home where he was present with his daughter. He was sitting up in his recliner awake and alert. Patient was more alert and engaged during this visit than previous visits.He did not appear to be in any pain or discomfort. His daughter provided a status update on patient. Patient continues to sleep a lot overall, but is resting better at night. The family continue using skin prep on his heels/foot and it has shown some improvement. Patient continues to have periods of incontinence.  His fluid and food intake has declined to where patient would drink 2 8 oz bottles of water per day, now he barely drinking 1. He is having agitation episodes where he is refusing  medications, personal care and cursing at his wife where the family  is having to reproach him. Patient had his pacemaker battery changed on October 6th and he did very well where he had renewed energy and alertness. Patient's family try to keep him engaged, however his activity level continues to decline.Patient's daughter  and son continue to  assist with patient's care and ambulation as he is a two-person assist. Patient is only taking a few steps, but mostly it stand to pivot. He had two slips out of bed last week as his wife described that patient's feet appear to be "shuffling". SW provided supportive presence, encouragement, education, assessment of patient  needs and comfort, coping and needs of PCG/family and reassurance of support. The family is open to ongoing palliative care/SW support. PATIENT/CAREGIVER EDUCATION/ COPING:  Patient and his family appear to be coping well.  PERSONAL EMERGENCY PLAN:  911 can be activated for emergencies. COMMUNITY RESOURCES COORDINATION/ HEALTH CARE NAVIGATION:  None. FINANCIAL/LEGAL CONCERNS/INTERVENTIONS:  None.     SOCIAL HX:  Social History   Tobacco Use   Smoking status: Never   Smokeless tobacco: Never  Substance Use Topics   Alcohol use: Not Currently    Alcohol/week: 0.0 standard drinks    CODE STATUS: DNR ADVANCED DIRECTIVES: Yes MOST FORM COMPLETE:  Yes HOSPICE EDUCATION PROVIDED: No  PPS: Patient is alert and oriented to self and situation only. He is sleeping more and having increased cognitive declined. He is dependent for personal care needs.    Duration of visit and documentation: 60 minutes.  [address] [place], [place]

## 2020-11-26 ENCOUNTER — Ambulatory Visit (INDEPENDENT_AMBULATORY_CARE_PROVIDER_SITE_OTHER): Payer: Medicare Other

## 2020-11-26 ENCOUNTER — Other Ambulatory Visit: Payer: Self-pay

## 2020-11-26 DIAGNOSIS — I442 Atrioventricular block, complete: Secondary | ICD-10-CM

## 2020-11-26 DIAGNOSIS — I5022 Chronic systolic (congestive) heart failure: Secondary | ICD-10-CM | POA: Diagnosis not present

## 2020-11-26 LAB — CUP PACEART INCLINIC DEVICE CHECK
Battery Remaining Longevity: 85 mo
Battery Voltage: 3.05 V
Brady Statistic RA Percent Paced: 0.31 %
Brady Statistic RV Percent Paced: 99 %
Date Time Interrogation Session: 20221019164751
Implantable Lead Implant Date: 20160930
Implantable Lead Implant Date: 20160930
Implantable Lead Implant Date: 20160930
Implantable Lead Location: 753858
Implantable Lead Location: 753859
Implantable Lead Location: 753860
Implantable Pulse Generator Implant Date: 20221006
Lead Channel Impedance Value: 387.5 Ohm
Lead Channel Impedance Value: 637.5 Ohm
Lead Channel Impedance Value: 712.5 Ohm
Lead Channel Pacing Threshold Amplitude: 0.75 V
Lead Channel Pacing Threshold Amplitude: 1.125 V
Lead Channel Pacing Threshold Amplitude: 1.5 V
Lead Channel Pacing Threshold Pulse Width: 0.5 ms
Lead Channel Pacing Threshold Pulse Width: 0.5 ms
Lead Channel Pacing Threshold Pulse Width: 0.9 ms
Lead Channel Sensing Intrinsic Amplitude: 3.5 mV
Lead Channel Sensing Intrinsic Amplitude: 5.1 mV
Lead Channel Setting Pacing Amplitude: 2 V
Lead Channel Setting Pacing Amplitude: 2.125
Lead Channel Setting Pacing Amplitude: 2.5 V
Lead Channel Setting Pacing Pulse Width: 0.5 ms
Lead Channel Setting Pacing Pulse Width: 0.9 ms
Lead Channel Setting Sensing Sensitivity: 5 mV
Pulse Gen Model: 3562
Pulse Gen Serial Number: 3963880

## 2020-11-26 NOTE — Progress Notes (Signed)
Wound check appointment s/p gen change 10/6. Steri-strips removed. Wound without redness or edema. Incision edges approximated, wound well healed. Normal device function. Thresholds, sensing, and impedances consistent with implant measurements. Device programmed at appropriate safety margins for chronic leads. Histogram distribution appropriate for patient and level of activity. 1 AMS episode 11/17/20 for approximately 1 hour. Burden <1%. Known history. Resumed Xarelto per hospital discharge instructions. Patient educated about wound care, arm mobility, lifting restrictions. Enrolled in remote monitoring and ICM clinic.  91 day follow up with Dr. Rayann Heman 02/17/20.

## 2020-11-26 NOTE — Patient Instructions (Signed)

## 2020-12-15 ENCOUNTER — Other Ambulatory Visit: Payer: Medicare Other | Admitting: *Deleted

## 2020-12-15 ENCOUNTER — Other Ambulatory Visit: Payer: Medicare Other

## 2020-12-15 ENCOUNTER — Other Ambulatory Visit: Payer: Self-pay

## 2020-12-15 VITALS — Ht 68.0 in | Wt 155.0 lb

## 2020-12-15 DIAGNOSIS — Z515 Encounter for palliative care: Secondary | ICD-10-CM

## 2020-12-15 NOTE — Progress Notes (Signed)
COMMUNITY PALLIATIVE CARE SW NOTE  PATIENT NAME: Thomas Long DOB: 05/14/1931 MRN: [id number]  PRIMARY CARE PROVIDER: Lajean Manes, MD  RESPONSIBLE PARTY:  Acct ID - Guarantor Home Phone Work Phone Relationship Acct Type  [id number] WOODSON, [phone]  Self P/F     [place] [address], [place], [place] [address]     PLAN OF CARE and INTERVENTIONS:             GOALS OF CARE/ ADVANCE CARE PLANNING:  Goal is for patient to remain in his home. Patient is a DNR.  SOCIAL/EMOTIONAL/SPIRITUAL ASSESSMENT/ INTERVENTIONS:  SW and RN-M. Nadara Mustard completed a face-to-face visit with patient at his home as requested by his daughter-[name] who advised that patient has declined over the past few weeks. Patient' wife and daughter was present with patient and provided an update on patient's status. Patient is sleeping later daily and they are having more difficulty getting up out of bed. Patient is throwing himself backwards. He is refusing medications where he is often spitting them out more frequently. Patient is refusing showers, instead the family is providing sponge baths. He is minimally verbal now, only speaking 3/4 fragmented verbalizations. He is a 1-2 person assist and they are using his lift chair to stand him up. He is ambulating with his walker, but with 1-2 person assist and lots of cuing and encouragement. Patient's dept perception is off a his glaucoma is worsened. Patient intake has decreased. He has lost weight with a current weight of 155 lb, which is an 18 lb lost overall in past 7 months. The team discussed hospice care with the family as patient's status seems to indicate that he is appropriate for hospice. His family was in agreement with hospice for patient. The palliative RN discussed patient's case with the palliative care medical director who agreed that patient meets eligibility for hospice care. RN to obtain appropriate orders.  PATIENT/CAREGIVER EDUCATION/ COPING:  The family is  coping adequately, but is trying to grapple with patient's decline. PERSONAL EMERGENCY PLAN:  911 can be activated for emergencies. COMMUNITY RESOURCES COORDINATION/ HEALTH CARE NAVIGATION:  None. FINANCIAL/LEGAL CONCERNS/INTERVENTIONS:  None.     SOCIAL HX:  Social History   Tobacco Use   Smoking status: Never   Smokeless tobacco: Never  Substance Use Topics   Alcohol use: Not Currently    Alcohol/week: 0.0 standard drinks    CODE STATUS: DNR  ADVANCED DIRECTIVES: Yes MOST FORM COMPLETE:  Yes HOSPICE EDUCATION PROVIDED: No  PPS: Patient is alert and oriented to self and situation only. He is sleeping more and having increased cognitive declined. He is dependent for personal care needs-patient is total care.    Duration of visit and documentation: 60 minutes.  [address] [place], [place]

## 2020-12-16 ENCOUNTER — Telehealth: Payer: Self-pay | Admitting: *Deleted

## 2020-12-16 DIAGNOSIS — Z515 Encounter for palliative care: Secondary | ICD-10-CM

## 2020-12-16 NOTE — Progress Notes (Signed)
[facility] COMMUNITY PALLIATIVE CARE RN NOTE  PATIENT NAME: Thomas Long DOB: 10/20/1931 MRN: [id number]  PRIMARY CARE PROVIDER: Lajean Manes, MD  RESPONSIBLE PARTY:  Acct ID - Guarantor Home Phone Work Phone Relationship Acct Type  [id number] KHRYSTIAN, [phone]  Self P/F     [address], [place], [place] [address]   Covid-19 Pre-screening Negative  PLAN OF CARE and INTERVENTION:  ADVANCE CARE PLANNING/GOALS OF CARE: Goal is for patient to remain safe. He has a DNR. PATIENT/CAREGIVER EDUCATION: Symptom management, disease progression, hospice vs palliative care DISEASE STATUS: Joint follow up visit completed with LCSW, M. Lonon. Met with patient, his wife, daughter and son who all live in the home. Family states that patient has had some recent worsening of his behaviors and they have been more consistent. They are having a difficult time getting him to get up out of the bed. He tends to start throwing his body backwards. He is also refusing medications more often. He spits them out or either chews them up. It is difficult to get him to take showers due to refusals. They often have to do sponge baths while he is sitting on the commode. He is also using more foul language. He is not recognizing family members at times. His voice is very weak. Significant word finding difficulties. He only speaks about 4 word sentences at a time. He is now always saying that he wants to go home. He is progressively weaker. They must elevate his lift chair to assist him to standing. He requires 1-2 person assistance with standing. He is still able to ambulate using a walker, however when he came into the living room today, he had the walker way out in front of him leaning forward and is shuffling more. He is having difficulty maneuvering on the 4 small steps he uses to get out of the home. He has increased truncal weakness and leans most of the time. They have had to start using his wheelchair more on  his weaker days. His intake has decreased. His best meal is breakfast, and dinner is hit or miss. He has lost 18 lbs over the past 7 months. Current weight today is 155 lbs. This is a 10.4% weight loss in 7 months. He is not drinking as much water. He is having increased issues with constipation requiring the use of suppositories more regularly. He is sleeping more overall. He now sleeps until around 12p and naps frequently during the day. I discussed hospice care with family and provided education and advised I will speak to our medical director to see what he says. Spoke with Dr. Konrad Dolores who based on my assessment feels patient meets hospice eligibility requirements for abnormal weight loss. Will update family with this information as requested in the morning since it is now 5p. They also discussed possible nursing home placement for patient, as care is becoming more difficult.   HISTORY OF PRESENT ILLNESS:  This is a 87 yo male with a history of CHF, Afib, hypertension, TIA, pacemaker, seizures and hyperlipidemia. Palliative care team continues to follow patient for goals of care, symptom management and complex decision making.  CODE STATUS: DNR ADVANCED DIRECTIVES: Y MOST FORM: yes PPS: 50%   PHYSICAL EXAM:   LUNGS: clear to auscultation  CARDIAC: Cor RRR, pacemaker EXTREMITIES: No edema SKIN:  Hard/thick skin to left heel and left outer knee   NEURO:  Alert and oriented to self, intermittent confusion, expressive aphasia, increased generalized weakness, ambulatory w/walker  Duration of visit and documentation 90 minutes)   Daryl Eastern, RN BSN

## 2020-12-16 NOTE — Telephone Encounter (Signed)
11:26a Received a communication from patient's daughter [name]. It stated that patient rolled out of bed last nigh and hit his head. Her and her brother were able to get him off the floor and back in bad. They placed ice on his head immediately to help with the swelling. He did not appear to be in any pain or distress so they decided not to call EMS. She is also interested in a hospital bed for patient with rails to prevent this from happening again.They have also decided to keep patient at home with looking into hiring caregivers to assist with personal care needs vs long term care facility placement.  11:54a I called and spoke with [name] to advise that I received her email and that I had also had a conversation with our hospice medical director and he feels he is hospice eligible. She advised she wants to discuss this with her mom first.  3:57p [name] later called me back and advised that her mom had questions regarding which medications would be discontinued or not covered when patient is under hospice care. Explained hospice further and each discipline's role and visit frequency. Advised that I will inquire about the medications and get back with them tomorrow on this. She says they are leaning more towards hospice care. Daughter is Patent attorney.

## 2020-12-22 ENCOUNTER — Telehealth: Payer: Self-pay

## 2020-12-22 NOTE — Telephone Encounter (Signed)
Phone call placed to PCP office. Message left for Angie to provide update on patient and to request an order for Hospice eval and confirm Dr. Felipa Eth will remain attending. Call back information provided

## 2021-01-01 ENCOUNTER — Encounter: Payer: Self-pay | Admitting: Cardiovascular Disease

## 2021-01-01 ENCOUNTER — Encounter (HOSPITAL_BASED_OUTPATIENT_CLINIC_OR_DEPARTMENT_OTHER): Payer: Self-pay | Admitting: Internal Medicine

## 2021-01-05 ENCOUNTER — Telehealth: Payer: Self-pay | Admitting: Adult Health

## 2021-01-05 NOTE — Telephone Encounter (Signed)
So sorry to hear of this. We will send a sympathy card to pt's address.

## 2021-01-05 NOTE — Telephone Encounter (Signed)
Pt's daughter called to inform office of pt's passing.

## 2021-01-06 NOTE — Telephone Encounter (Signed)
I have just  received the massage of my patients passing, please send a condolence card from me and Ward Givens, NP. Thank Jerilee Hoh

## 2021-01-08 DEATH — deceased

## 2021-02-16 ENCOUNTER — Encounter: Payer: Medicare Other | Admitting: Internal Medicine

## 2021-02-25 ENCOUNTER — Encounter (HOSPITAL_BASED_OUTPATIENT_CLINIC_OR_DEPARTMENT_OTHER): Payer: Medicare Other | Admitting: Internal Medicine
